# Patient Record
Sex: Male | Born: 1955 | Race: White | Hispanic: No | Marital: Married | Smoking: Never smoker
Health system: Southern US, Community
[De-identification: ages and names within clinical notes are randomized; demographics above are authoritative.]

## PROBLEM LIST (undated history)

## (undated) DIAGNOSIS — E785 Hyperlipidemia, unspecified: Secondary | ICD-10-CM

## (undated) DIAGNOSIS — R9389 Abnormal findings on diagnostic imaging of other specified body structures: Secondary | ICD-10-CM

## (undated) DIAGNOSIS — R42 Dizziness and giddiness: Secondary | ICD-10-CM

## (undated) DIAGNOSIS — G43909 Migraine, unspecified, not intractable, without status migrainosus: Secondary | ICD-10-CM

## (undated) DIAGNOSIS — Z9889 Other specified postprocedural states: Secondary | ICD-10-CM

## (undated) DIAGNOSIS — M1990 Unspecified osteoarthritis, unspecified site: Secondary | ICD-10-CM

## (undated) DIAGNOSIS — G35 Multiple sclerosis: Secondary | ICD-10-CM

## (undated) DIAGNOSIS — M4120 Other idiopathic scoliosis, site unspecified: Secondary | ICD-10-CM

## (undated) DIAGNOSIS — F419 Anxiety disorder, unspecified: Secondary | ICD-10-CM

## (undated) DIAGNOSIS — H269 Unspecified cataract: Secondary | ICD-10-CM

## (undated) DIAGNOSIS — R112 Nausea with vomiting, unspecified: Secondary | ICD-10-CM

## (undated) DIAGNOSIS — T7840XA Allergy, unspecified, initial encounter: Secondary | ICD-10-CM

## (undated) DIAGNOSIS — Z87442 Personal history of urinary calculi: Secondary | ICD-10-CM

## (undated) DIAGNOSIS — G709 Myoneural disorder, unspecified: Secondary | ICD-10-CM

## (undated) DIAGNOSIS — K219 Gastro-esophageal reflux disease without esophagitis: Secondary | ICD-10-CM

## (undated) HISTORY — PX: LUMBAR DISC SURGERY: SHX700

## (undated) HISTORY — DX: Hyperlipidemia, unspecified: E78.5

## (undated) HISTORY — DX: Unspecified cataract: H26.9

## (undated) HISTORY — DX: Abnormal findings on diagnostic imaging of other specified body structures: R93.89

## (undated) HISTORY — DX: Myoneural disorder, unspecified: G70.9

## (undated) HISTORY — DX: Unspecified osteoarthritis, unspecified site: M19.90

## (undated) HISTORY — PX: TONSILLECTOMY: SUR1361

## (undated) HISTORY — DX: Allergy, unspecified, initial encounter: T78.40XA

## (undated) HISTORY — DX: Gastro-esophageal reflux disease without esophagitis: K21.9

---

## 2001-11-16 HISTORY — PX: URETEROLITHOTOMY: SHX71

## 2004-10-28 ENCOUNTER — Ambulatory Visit: Payer: Self-pay | Admitting: Internal Medicine

## 2004-11-16 HISTORY — PX: US ECHOCARDIOGRAPHY: HXRAD669

## 2004-11-22 ENCOUNTER — Ambulatory Visit: Payer: Self-pay | Admitting: Internal Medicine

## 2004-11-30 ENCOUNTER — Ambulatory Visit: Payer: Self-pay | Admitting: Internal Medicine

## 2005-04-18 DIAGNOSIS — R9389 Abnormal findings on diagnostic imaging of other specified body structures: Secondary | ICD-10-CM

## 2005-04-18 HISTORY — DX: Abnormal findings on diagnostic imaging of other specified body structures: R93.89

## 2005-10-11 ENCOUNTER — Encounter: Payer: Self-pay | Admitting: Internal Medicine

## 2005-10-11 ENCOUNTER — Ambulatory Visit: Payer: Self-pay | Admitting: Ophthalmology

## 2005-10-16 DIAGNOSIS — G35 Multiple sclerosis: Secondary | ICD-10-CM | POA: Insufficient documentation

## 2005-10-28 ENCOUNTER — Ambulatory Visit: Payer: Self-pay | Admitting: Internal Medicine

## 2005-11-21 ENCOUNTER — Ambulatory Visit: Payer: Self-pay

## 2005-11-21 ENCOUNTER — Encounter: Payer: Self-pay | Admitting: Cardiology

## 2006-04-28 ENCOUNTER — Ambulatory Visit: Payer: Self-pay | Admitting: Internal Medicine

## 2006-07-19 DIAGNOSIS — N200 Calculus of kidney: Secondary | ICD-10-CM | POA: Insufficient documentation

## 2006-07-19 DIAGNOSIS — J301 Allergic rhinitis due to pollen: Secondary | ICD-10-CM | POA: Insufficient documentation

## 2006-09-20 ENCOUNTER — Encounter (INDEPENDENT_AMBULATORY_CARE_PROVIDER_SITE_OTHER): Payer: Self-pay | Admitting: *Deleted

## 2006-09-21 ENCOUNTER — Ambulatory Visit: Payer: Self-pay | Admitting: Internal Medicine

## 2006-09-21 LAB — CONVERTED CEMR LAB
Direct LDL: 152.2 mg/dL
PSA: 0.38 ng/mL (ref 0.10–4.00)

## 2006-11-09 ENCOUNTER — Ambulatory Visit: Payer: Self-pay | Admitting: Internal Medicine

## 2007-06-04 ENCOUNTER — Ambulatory Visit: Payer: Self-pay | Admitting: Internal Medicine

## 2007-06-04 DIAGNOSIS — R6882 Decreased libido: Secondary | ICD-10-CM | POA: Insufficient documentation

## 2007-06-13 ENCOUNTER — Ambulatory Visit: Payer: Self-pay | Admitting: Internal Medicine

## 2007-06-27 ENCOUNTER — Ambulatory Visit: Payer: Self-pay | Admitting: Internal Medicine

## 2007-06-27 ENCOUNTER — Encounter: Payer: Self-pay | Admitting: Internal Medicine

## 2007-06-27 HISTORY — PX: COLONOSCOPY: SHX174

## 2007-06-27 LAB — HM COLONOSCOPY

## 2007-08-14 ENCOUNTER — Telehealth (INDEPENDENT_AMBULATORY_CARE_PROVIDER_SITE_OTHER): Payer: Self-pay | Admitting: *Deleted

## 2007-08-20 ENCOUNTER — Telehealth: Payer: Self-pay | Admitting: Internal Medicine

## 2007-10-03 ENCOUNTER — Ambulatory Visit: Payer: Self-pay | Admitting: Internal Medicine

## 2007-10-03 DIAGNOSIS — E785 Hyperlipidemia, unspecified: Secondary | ICD-10-CM | POA: Insufficient documentation

## 2007-10-03 LAB — CONVERTED CEMR LAB: PSA: 0.33 ng/mL (ref 0.10–4.00)

## 2007-11-19 ENCOUNTER — Ambulatory Visit: Payer: Self-pay | Admitting: Internal Medicine

## 2007-11-19 DIAGNOSIS — J069 Acute upper respiratory infection, unspecified: Secondary | ICD-10-CM | POA: Insufficient documentation

## 2008-02-07 ENCOUNTER — Ambulatory Visit: Payer: Self-pay | Admitting: Internal Medicine

## 2008-06-24 ENCOUNTER — Ambulatory Visit: Payer: Self-pay | Admitting: Family Medicine

## 2008-06-24 DIAGNOSIS — IMO0002 Reserved for concepts with insufficient information to code with codable children: Secondary | ICD-10-CM | POA: Insufficient documentation

## 2008-07-31 ENCOUNTER — Telehealth: Payer: Self-pay | Admitting: Internal Medicine

## 2008-12-01 ENCOUNTER — Ambulatory Visit: Payer: Self-pay | Admitting: Internal Medicine

## 2008-12-01 DIAGNOSIS — IMO0002 Reserved for concepts with insufficient information to code with codable children: Secondary | ICD-10-CM | POA: Insufficient documentation

## 2009-04-03 ENCOUNTER — Telehealth: Payer: Self-pay | Admitting: Internal Medicine

## 2010-09-03 ENCOUNTER — Other Ambulatory Visit: Payer: Self-pay | Admitting: *Deleted

## 2010-09-03 MED ORDER — FLUTICASONE PROPIONATE 50 MCG/ACT NA SUSP: 2.0000 | Freq: Every day | NASAL | Status: DC

## 2010-09-03 NOTE — Telephone Encounter (Signed)
Patient advised as instructed via telephone.  Rx refilled electronically.

## 2010-09-03 NOTE — Telephone Encounter (Signed)
Patient has not been seen since 2010, please advise.

## 2010-09-03 NOTE — Telephone Encounter (Signed)
Okay to refill #1 x 2 Have him set up PE

## 2011-04-15 ENCOUNTER — Other Ambulatory Visit: Payer: Self-pay | Admitting: *Deleted

## 2011-04-15 MED ORDER — HYDROCORTISONE ACETATE 25 MG RE SUPP: 25.0000 mg | Freq: Three times a day (TID) | RECTAL | Status: DC | PRN

## 2011-04-15 NOTE — Telephone Encounter (Signed)
Spoke with patient and he states he has hemorrhoids, rx sent to pharmacy by e-script, and the box comes 24 to a box.

## 2011-04-15 NOTE — Telephone Encounter (Signed)
Received faxed refill request, medication was not on med list.  Please advise.

## 2011-04-15 NOTE — Telephone Encounter (Signed)
Please confirm that he needs these again (for rectal pain or bleeding) He has had a colonoscopy so we can refill #20 x 0 if he needs

## 2011-08-15 ENCOUNTER — Other Ambulatory Visit: Payer: Self-pay

## 2011-08-15 NOTE — Telephone Encounter (Signed)
Pt left v/m that [facility] would be requesting refill for flonase. Pt last seen 12/01/08. Pt did not leave contact #.

## 2011-08-16 MED ORDER — FLUTICASONE PROPIONATE 50 MCG/ACT NA SUSP: 2.0000 | Freq: Every day | NASAL | Status: DC

## 2011-08-16 NOTE — Telephone Encounter (Signed)
Patient notified as instructed by telephone. Flonase sent electronically #16 g x 11 to [facility]. Pt notified while on phone med sent and pt will call back to schedule appt.

## 2011-08-16 NOTE — Telephone Encounter (Signed)
.  left message to have patient return my call.  

## 2011-08-16 NOTE — Telephone Encounter (Signed)
Okay to fill for a year He needs to set up physical---within the next few months

## 2012-02-23 ENCOUNTER — Encounter: Payer: Self-pay | Admitting: Internal Medicine

## 2012-02-23 ENCOUNTER — Ambulatory Visit (INDEPENDENT_AMBULATORY_CARE_PROVIDER_SITE_OTHER): Payer: BC Managed Care – PPO | Admitting: Internal Medicine

## 2012-02-23 VITALS — BP 118/70 | HR 70 | Temp 98.4°F | Ht 70.0 in | Wt 173.0 lb

## 2012-02-23 DIAGNOSIS — Z0000 Encounter for general adult medical examination without abnormal findings: Secondary | ICD-10-CM

## 2012-02-23 DIAGNOSIS — K219 Gastro-esophageal reflux disease without esophagitis: Secondary | ICD-10-CM | POA: Insufficient documentation

## 2012-02-23 DIAGNOSIS — E785 Hyperlipidemia, unspecified: Secondary | ICD-10-CM

## 2012-02-23 DIAGNOSIS — Z23 Encounter for immunization: Secondary | ICD-10-CM

## 2012-02-23 NOTE — Assessment & Plan Note (Signed)
Mild symptoms Asked him to increase zantac to bid

## 2012-02-23 NOTE — Assessment & Plan Note (Signed)
Healthy Has really gotten in better shape Discussed PSA---he would like to forego that Colon due 2019

## 2012-02-23 NOTE — Progress Notes (Signed)
Subjective:    Patient ID: Cameron [name], male    DOB: July 11, 1955, 56 y.o.   MRN: [id number]  HPI Here for physical  Still with occ visual issues---sensitivity to fluorescent lights (like in dept stores---will get dizzy, sweaty and balance is off) Occurs mostly if he is wearing glasses--not contacts (monofocal) No other neurologic symptoms  Has gotten back into shape Lost the 15# he had gained Working out more Had crisis with mom's decline and death in Sep 23, 2010 and dad needed heart surgery---he started taking better care of himself  Current Outpatient Prescriptions on File Prior to Visit  Medication Sig Dispense Refill  . fluticasone (FLONASE) 50 MCG/ACT nasal spray Place 2 sprays into the nose daily.  16 g  11    Allergies  Allergen Reactions  . Citalopram Hydrobromide     REACTION: unspecified  . Penicillins     REACTION: questionable    Past Medical History  Diagnosis Date  . Allergy   . Hyperlipidemia   . Abnormal MRI 22-Sep-2005    nonspecific white matter abnormalities    Past Surgical History  Procedure Date  . Ureterolithotomy 11/2001  . US echocardiography 11/2004    normal, Carotids normal    Family History  Problem Relation Age of Onset  . Diabetes Father   . Heart disease Father   . CAD Neg Hx   . Hypertension Neg Hx   . Cancer Neg Hx     History   Social History  . Marital Status: Single    Spouse Name: N/A    Number of Children: N/A  . Years of Education: N/A   Occupational History  . 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 Smoking status: Never Smoker   . Smokeless tobacco: Never Used  . Alcohol Use: Yes     Comment: rare  . Drug Use: No  . Sexually Active: Not on file   Other Topics Concern  . Not on file   Social History Narrative   Long standing relationship with partner   Review of Systems  Constitutional: Negative for fatigue and unexpected weight change.       Wears seat  belt Running regularly and doing weight work  HENT: Positive for congestion and rhinorrhea. Negative for hearing loss, dental problem and tinnitus.        Regular with dentist flonase only in spring  Eyes: Negative for visual disturbance.       No diplopia or unilateral vision changes  Respiratory: Positive for cough. Negative for chest tightness and shortness of breath.        Does note some cough and phlegm after eating ?reflux issue  Cardiovascular: Negative for chest pain, palpitations and leg swelling.  Gastrointestinal: Negative for nausea, vomiting, abdominal pain, constipation and blood in stool.       No heartburn--- but does have some voice changes (may be from heavy singing schedule) No dysphagia  Genitourinary: Positive for urgency and difficulty urinating.       Doesn't empty fully in the morning No sexual problems  Musculoskeletal: Positive for back pain. Negative for joint swelling and arthralgias.       Occ mild low back pain after working out---ibuprofen helps   Skin: Negative for rash.       No suspicious areas  Neurological: Negative for dizziness, syncope, weakness, light-headedness, numbness and headaches.  Psychiatric/Behavioral: Negative for sleep disturbance and dysphoric mood. The patient is not nervous/anxious.  Does is own meditation       Objective:   Physical Exam  Constitutional: He is oriented to person, place, and time. He appears well-developed and well-nourished. No distress.  HENT:  Head: Normocephalic and atraumatic.  Right Ear: External ear normal.  Left Ear: External ear normal.  Mouth/Throat: Oropharynx is clear and moist. No oropharyngeal exudate.  Eyes: Conjunctivae normal and EOM are normal. Pupils are equal, round, and reactive to light.  Neck: Normal range of motion. Neck supple. No thyromegaly present.  Cardiovascular: Normal rate, regular rhythm, normal heart sounds and intact distal pulses.  Exam reveals no gallop.   No  murmur heard. Pulmonary/Chest: Effort normal and breath sounds normal. No respiratory distress. He has no wheezes. He has no rales.  Abdominal: Soft. There is no tenderness.  Musculoskeletal: Normal range of motion. He exhibits no edema and no tenderness.  Lymphadenopathy:    He has no cervical adenopathy.  Neurological: He is alert and oriented to person, place, and time.  Skin: No rash noted. No erythema.  Psychiatric: He has a normal mood and affect. His behavior is normal. Thought content normal.          Assessment & Plan:

## 2012-02-23 NOTE — Assessment & Plan Note (Signed)
Hopefully better now with exercise and weight loss Doesn't want meds for primary prevention at this point

## 2012-02-24 LAB — BASIC METABOLIC PANEL
CO2: 27 mEq/L (ref 19–32)
Calcium: 8.9 mg/dL (ref 8.4–10.5)
Creatinine, Ser: 1.3 mg/dL (ref 0.4–1.5)
GFR: 63.04 mL/min (ref >60.00)
Glucose, Bld: 83 mg/dL (ref 70–99)

## 2012-02-24 LAB — CBC WITH DIFFERENTIAL/PLATELET
Basophils Absolute: 0 10*3/uL (ref 0.0–0.1)
HCT: 45 % (ref 39.0–52.0)
Hemoglobin: 14.9 g/dL (ref 13.0–17.0)
Lymphs Abs: 2.8 10*3/uL (ref 0.7–4.0)
MCV: 94.2 fl (ref 78.0–100.0)
Monocytes Absolute: 0.4 10*3/uL (ref 0.1–1.0)
Monocytes Relative: 6.1 % (ref 3.0–12.0)
Neutro Abs: 3.6 10*3/uL (ref 1.4–7.7)
RDW: 12.2 % (ref 11.5–14.6)

## 2012-02-24 LAB — LDL CHOLESTEROL, DIRECT: Direct LDL: 175.4 mg/dL

## 2012-02-24 LAB — LIPID PANEL
Cholesterol: 246 mg/dL — ABNORMAL HIGH (ref 0–200)
Total CHOL/HDL Ratio: 5
Triglycerides: 122 mg/dL (ref 0.0–149.0)

## 2012-02-24 LAB — HEPATIC FUNCTION PANEL
ALT: 18 U/L (ref 0–53)
Total Bilirubin: 0.7 mg/dL (ref 0.3–1.2)
Total Protein: 7.3 g/dL (ref 6.0–8.3)

## 2012-02-28 ENCOUNTER — Encounter: Payer: Self-pay | Admitting: *Deleted

## 2012-07-09 ENCOUNTER — Telehealth: Payer: Self-pay | Admitting: *Deleted

## 2012-07-09 MED ORDER — HYDROCORTISONE ACETATE 25 MG RE SUPP: 25.0000 mg | Freq: Three times a day (TID) | RECTAL | Status: DC | PRN

## 2012-07-09 NOTE — Telephone Encounter (Signed)
rx sent to pharmacy by e-script  

## 2012-07-09 NOTE — Telephone Encounter (Signed)
Faxed request asking for hydrocortisone 25mg  suppository place 1 suppository rectally three times daily as needed,  Not on med list, last filled 04/15/11

## 2012-07-09 NOTE — Telephone Encounter (Signed)
Okay to fill #30 x 1

## 2012-09-19 ENCOUNTER — Encounter: Payer: Self-pay | Admitting: Internal Medicine

## 2012-09-19 ENCOUNTER — Ambulatory Visit (INDEPENDENT_AMBULATORY_CARE_PROVIDER_SITE_OTHER): Payer: BC Managed Care – PPO | Admitting: Internal Medicine

## 2012-09-19 VITALS — BP 100/70 | HR 71 | Temp 98.3°F | Wt 182.0 lb

## 2012-09-19 DIAGNOSIS — G35 Multiple sclerosis: Secondary | ICD-10-CM

## 2012-09-19 LAB — CBC WITH DIFFERENTIAL/PLATELET
Eosinophils Relative: 0.5 % (ref 0.0–5.0)
HCT: 46.2 % (ref 39.0–52.0)
Hemoglobin: 16 g/dL (ref 13.0–17.0)
Lymphocytes Relative: 37 % (ref 12.0–46.0)
Lymphs Abs: 2 10*3/uL (ref 0.7–4.0)
Monocytes Relative: 7.1 % (ref 3.0–12.0)
Neutro Abs: 2.9 10*3/uL (ref 1.4–7.7)
WBC: 5.3 10*3/uL (ref 4.5–10.5)

## 2012-09-19 LAB — HEPATIC FUNCTION PANEL
AST: 18 U/L (ref 0–37)
Albumin: 4.2 g/dL (ref 3.5–5.2)
Alkaline Phosphatase: 56 U/L (ref 39–117)
Bilirubin, Direct: 0 mg/dL (ref 0.0–0.3)
Total Protein: 7.3 g/dL (ref 6.0–8.3)

## 2012-09-19 LAB — VITAMIN B12: Vitamin B-12: 430 pg/mL (ref 211–911)

## 2012-09-19 LAB — BASIC METABOLIC PANEL
GFR: 75.15 mL/min (ref >60.00)
Potassium: 4.5 mEq/L (ref 3.5–5.1)
Sodium: 138 mEq/L (ref 135–145)

## 2012-09-19 LAB — SEDIMENTATION RATE: Sed Rate: 8 mm/hr (ref 0–22)

## 2012-09-19 LAB — TSH: TSH: 3.93 u[IU]/mL (ref 0.35–5.50)

## 2012-09-19 NOTE — Assessment & Plan Note (Signed)
He now has multiple neurologic symptoms separated in space Has had mild symptoms in past and known white matter abnormalities years ago Vision issue is unusual---has had regular opto checks without optic neuritis Runs with symptoms that sound like foot drop (but not found on exam) Sensory changes in ulnar area on left (?related to hitting arm during fall)  I feel the next step should be neuro eval We will set this up

## 2012-09-19 NOTE — Progress Notes (Signed)
Subjective:    Patient ID: Cameron [name], male    DOB: 03-12-1956, 57 y.o.   MRN: [id number]  HPI Has put on 9# since last visit Mostly because of a back injury (?sciatic nerve) Has noted that his left leg is not "tracking" right---he has stopped running The foot drags on treadmill or when he runs  Then his back seemed better so he went for a run and wound up falling Abraded hands, shoulder, etc. Took a month to heal up Now has prickly sensation in distal 3rd-5th left fingers Back of left leg feels abnormal in sensation also  He still has balance issues in bright light---lists to the left and breaks out with sweat Will have some confusion Only in [employer] or other places with bright fluorescent lighting  Has become cognitively overwhelmed a few times in the past few months ?perhaps 3-4 times Unable to focus or answer questions May last for about 15 minutes Concerned due to early dementia in dad (and GM also)  Current Outpatient Prescriptions on File Prior to Visit  Medication Sig Dispense Refill  . hydrocortisone (ANUSOL-HC) 25 MG suppository Place 1 suppository (25 mg total) rectally 3 (three) times daily as needed.  30 suppository  1  . ranitidine (ZANTAC) 150 MG tablet Take 150 mg by mouth 2 (two) times daily.        No current facility-administered medications on file prior to visit.    Allergies  Allergen Reactions  . Citalopram Hydrobromide     REACTION: unspecified  . Penicillins     REACTION: questionable    Past Medical History  Diagnosis Date  . Allergy   . Hyperlipidemia   . Abnormal MRI 2007    nonspecific white matter abnormalities  . GERD (gastroesophageal reflux disease)     Past Surgical History  Procedure Laterality Date  . Ureterolithotomy  11/2001  . US echocardiography  11/2004    normal, Carotids normal    Family History  Problem Relation Age of Onset  . Diabetes Father   . Heart disease Father   . Dementia Father   . CAD Neg Hx    . Hypertension Neg Hx   . Cancer Neg Hx   . Dementia Paternal Grandmother     History   Social History  . Marital Status: Single    Spouse Name: N/A    Number of Children: N/A  . Years of Education: N/A   Occupational History  . 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 Smoking status: Never Smoker   . Smokeless tobacco: Never Used  . Alcohol Use: Yes     Comment: rare  . Drug Use: No  . Sexually Active: Not on file   Other Topics Concern  . Not on file   Social History Narrative   Long standing relationship with partner   Review of Systems Sleeping okay Increased nocturia---drinking a lot of water lately Appetite is fine Not depressed or anhedonic---has had a great year Does note some anxiety now that he is on summer break--usually stays so busy    Objective:   Physical Exam  Constitutional: He is oriented to person, place, and time. He appears well-developed and well-nourished. No distress.  HENT:  Mouth/Throat: Oropharynx is clear and moist. No oropharyngeal exudate.  Eyes: Conjunctivae and EOM are normal. Pupils are equal, round, and reactive to light.  ?slight blurring of discs nasally  Neck: Normal range of motion. Neck supple. No thyromegaly  present.  Cardiovascular: Normal rate, regular rhythm and normal heart sounds.  Exam reveals no gallop.   No murmur heard. Pulmonary/Chest: Effort normal and breath sounds normal. No respiratory distress. He has no wheezes. He has no rales.  Musculoskeletal: He exhibits no edema and no tenderness.  Lymphadenopathy:    He has no cervical adenopathy.  Neurological: He is alert and oriented to person, place, and time. He has normal strength. He displays no tremor. No cranial nerve deficit or sensory deficit. He exhibits normal muscle tone. He displays a negative Romberg sign. Coordination and gait normal.  Reflexes symmetric  Psychiatric: He has a normal mood and affect. His  behavior is normal.          Assessment & Plan:

## 2012-09-21 ENCOUNTER — Other Ambulatory Visit: Payer: Self-pay | Admitting: *Deleted

## 2012-09-21 MED ORDER — FLUTICASONE PROPIONATE 50 MCG/ACT NA SUSP: 2.0000 | Freq: Every day | NASAL | Status: DC

## 2012-10-29 ENCOUNTER — Ambulatory Visit: Payer: Self-pay | Admitting: Neurology

## 2013-02-21 ENCOUNTER — Other Ambulatory Visit: Payer: Self-pay

## 2013-08-03 ENCOUNTER — Other Ambulatory Visit: Payer: Self-pay | Admitting: Internal Medicine

## 2013-08-05 NOTE — Telephone Encounter (Signed)
Ok to fill 

## 2013-08-05 NOTE — Telephone Encounter (Signed)
rx sent to pharmacy by e-script  

## 2013-08-05 NOTE — Telephone Encounter (Signed)
Okay to fill for a year Have him schedule PE for later this year

## 2013-09-05 ENCOUNTER — Ambulatory Visit (INDEPENDENT_AMBULATORY_CARE_PROVIDER_SITE_OTHER): Payer: BC Managed Care – PPO | Admitting: Internal Medicine

## 2013-09-05 ENCOUNTER — Encounter: Payer: Self-pay | Admitting: Internal Medicine

## 2013-09-05 VITALS — BP 100/60 | HR 68 | Temp 97.7°F | Wt 181.0 lb

## 2013-09-05 DIAGNOSIS — M5432 Sciatica, left side: Secondary | ICD-10-CM

## 2013-09-05 DIAGNOSIS — M5430 Sciatica, unspecified side: Secondary | ICD-10-CM

## 2013-09-05 NOTE — Progress Notes (Signed)
   Subjective:    Patient ID: Cameron [name], male    DOB: [birth year], 58 y.o.   MRN: [id number]  HPI Has periodic back problems Mostly on the left over his hip Considers it sciatica Some better now with intermittent icing [facility]--and that seemed to help also  Pain occasionally radiates to thigh and outside left knee No spine tenderness Usually will get comfortable in bed but variable--needs to pull his leg up to get out (and into car)  Has tried ibuprofen 600mg  in AM--may repeat at bedtime Seems to help some  Current Outpatient Prescriptions on File Prior to Visit  Medication Sig Dispense Refill  . fluticasone (FLONASE) 50 MCG/ACT nasal spray USE 2 SPRAYS IN EACH NOSTRIL EVERY DAY AS DIRECTED  16 g  11  . hydrocortisone (ANUSOL-HC) 25 MG suppository Place 1 suppository (25 mg total) rectally 3 (three) times daily as needed.  30 suppository  1  . ranitidine (ZANTAC) 150 MG tablet Take 150 mg by mouth 2 (two) times daily.        No current facility-administered medications on file prior to visit.    Allergies  Allergen Reactions  . Citalopram Hydrobromide     REACTION: unspecified  . Penicillins     REACTION: questionable    Past Medical History  Diagnosis Date  . Allergy   . Hyperlipidemia   . Abnormal MRI 2007    nonspecific white matter abnormalities  . GERD (gastroesophageal reflux disease)     Past Surgical History  Procedure Laterality Date  . Ureterolithotomy  11/2001  . US echocardiography  11/2004    normal, Carotids normal    Family History  Problem Relation Age of Onset  . Diabetes Father   . Heart disease Father   . Dementia Father   . CAD Neg Hx   . Hypertension Neg Hx   . Cancer Neg Hx   . Dementia Paternal Grandmother     History   Social History  . Marital Status: Married    Spouse Name: N/A    Number of Children: N/A  . Years of Education: N/A   Occupational History  . 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 Smoking status: Never Smoker   . Smokeless tobacco: Never Used  . Alcohol Use: Yes     Comment: rare  . Drug Use: No  . Sexual Activity: Not on file   Other Topics Concern  . Not on file   Social History Narrative   Long standing relationship with partner   Review of Systems Still has some foot dragging--- doesn't run outside due to this No leg weakness    Objective:   Physical Exam  Musculoskeletal:  No spine tenderness Normal ROM at left hip SLR negative  Neurological:  Gait is normal Strength normal in legs except mild weakness in dorsiflexion at left ankle          Assessment & Plan:

## 2013-09-05 NOTE — Assessment & Plan Note (Signed)
Better with conservative Rx Discussed generally good prognosis NSAIDs, etc

## 2013-09-05 NOTE — Progress Notes (Signed)
Pre visit review using our clinic review tool, if applicable. No additional management support is needed unless otherwise documented below in the visit note. 

## 2013-09-05 NOTE — Patient Instructions (Signed)
Please look into an ankle foot orthotic for your left foot.

## 2013-10-16 ENCOUNTER — Encounter: Payer: Self-pay | Admitting: Internal Medicine

## 2013-10-16 ENCOUNTER — Ambulatory Visit (INDEPENDENT_AMBULATORY_CARE_PROVIDER_SITE_OTHER): Payer: BC Managed Care – PPO | Admitting: Internal Medicine

## 2013-10-16 VITALS — BP 110/60 | HR 63 | Temp 97.9°F | Ht 70.0 in | Wt 182.0 lb

## 2013-10-16 DIAGNOSIS — K219 Gastro-esophageal reflux disease without esophagitis: Secondary | ICD-10-CM

## 2013-10-16 DIAGNOSIS — Z125 Encounter for screening for malignant neoplasm of prostate: Secondary | ICD-10-CM

## 2013-10-16 DIAGNOSIS — Z0000 Encounter for general adult medical examination without abnormal findings: Secondary | ICD-10-CM

## 2013-10-16 DIAGNOSIS — E785 Hyperlipidemia, unspecified: Secondary | ICD-10-CM

## 2013-10-16 LAB — LIPID PANEL
CHOL/HDL RATIO: 5
CHOLESTEROL: 270 mg/dL — AB (ref 0–200)
HDL: 50.5 mg/dL (ref >39.00)
LDL Cholesterol: 183 mg/dL — ABNORMAL HIGH (ref 0–99)
NonHDL: 219.5
Triglycerides: 185 mg/dL — ABNORMAL HIGH (ref 0.0–149.0)
VLDL: 37 mg/dL (ref 0.0–40.0)

## 2013-10-16 LAB — PSA: PSA: 0.43 ng/mL (ref 0.10–4.00)

## 2013-10-16 LAB — COMPREHENSIVE METABOLIC PANEL
ALT: 15 U/L (ref 0–53)
AST: 20 U/L (ref 0–37)
Albumin: 4.2 g/dL (ref 3.5–5.2)
Alkaline Phosphatase: 57 U/L (ref 39–117)
BUN: 14 mg/dL (ref 6–23)
CO2: 28 mEq/L (ref 19–32)
Calcium: 9.4 mg/dL (ref 8.4–10.5)
Chloride: 102 mEq/L (ref 96–112)
Creatinine, Ser: 1.1 mg/dL (ref 0.4–1.5)
GFR: 76.5 mL/min (ref >60.00)
Glucose, Bld: 75 mg/dL (ref 70–99)
Potassium: 4.9 mEq/L (ref 3.5–5.1)
Sodium: 136 mEq/L (ref 135–145)
Total Bilirubin: 0.9 mg/dL (ref 0.2–1.2)
Total Protein: 7.3 g/dL (ref 6.0–8.3)

## 2013-10-16 LAB — CBC WITH DIFFERENTIAL/PLATELET
BASOS ABS: 0 10*3/uL (ref 0.0–0.1)
Basophils Relative: 0.8 % (ref 0.0–3.0)
EOS ABS: 0 10*3/uL (ref 0.0–0.7)
Eosinophils Relative: 0.8 % (ref 0.0–5.0)
HEMATOCRIT: 43.3 % (ref 39.0–52.0)
Hemoglobin: 14.9 g/dL (ref 13.0–17.0)
LYMPHS ABS: 2 10*3/uL (ref 0.7–4.0)
Lymphocytes Relative: 44.2 % (ref 12.0–46.0)
MCHC: 34.3 g/dL (ref 30.0–36.0)
MCV: 92.4 fl (ref 78.0–100.0)
MONO ABS: 0.4 10*3/uL (ref 0.1–1.0)
Monocytes Relative: 8.6 % (ref 3.0–12.0)
Neutro Abs: 2.1 10*3/uL (ref 1.4–7.7)
Neutrophils Relative %: 45.6 % (ref 43.0–77.0)
PLATELETS: 241 10*3/uL (ref 150.0–400.0)
RBC: 4.69 Mil/uL (ref 4.22–5.81)
RDW: 12.9 % (ref 11.5–15.5)
WBC: 4.6 10*3/uL (ref 4.0–10.5)

## 2013-10-16 LAB — TSH: TSH: 5.45 u[IU]/mL — AB (ref 0.35–4.50)

## 2013-10-16 LAB — T4, FREE: Free T4: 0.67 ng/dL (ref 0.60–1.60)

## 2013-10-16 NOTE — Progress Notes (Signed)
Subjective:    Patient ID: Cameron [name], male    DOB: 10-Sep-1955, 58 y.o.   MRN: [id number]  HPI Here for physical  Is better from the sciatica [place] [facility] [place] [facility]---heavy walking or jogging on treadmill (5 miles) Gait still off some--- left foot will still drag some when fatigued Still has some numbness in left fingers also No other neuro problems of note. Feels the light sensitivity is better using old glasses  No other concerns Still not "in control of my diet"  Using the zantac bid regularly This has controlled his heartburn When not on it--- it affected his voice  Current Outpatient Prescriptions on File Prior to Visit  Medication Sig Dispense Refill  . ranitidine (ZANTAC) 150 MG tablet Take 150 mg by mouth 2 (two) times daily.        No current facility-administered medications on file prior to visit.    Allergies  Allergen Reactions  . Citalopram Hydrobromide     REACTION: unspecified  . Penicillins     REACTION: questionable    Past Medical History  Diagnosis Date  . Allergy   . Hyperlipidemia   . Abnormal MRI 2007    nonspecific white matter abnormalities  . GERD (gastroesophageal reflux disease)     Past Surgical History  Procedure Laterality Date  . Ureterolithotomy  11/2001  . US echocardiography  11/2004    normal, Carotids normal    Family History  Problem Relation Age of Onset  . Diabetes Father   . Heart disease Father   . Dementia Father   . CAD Neg Hx   . Hypertension Neg Hx   . Cancer Neg Hx   . Dementia Paternal Grandmother     History   Social History  . Marital Status: Married    Spouse Name: N/A    Number of Children: N/A  . Years of Education: N/A   Occupational History  . 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 Smoking status: Never Smoker   . Smokeless tobacco: Never Used  . Alcohol Use: Yes     Comment: rare  . Drug Use: No  . Sexual Activity: Not on file   Other  Topics Concern  . Not on file   Social History Narrative   Long standing relationship with partner   Review of Systems  Constitutional: Negative for fatigue and unexpected weight change.       Wears seat belt  HENT: Negative for dental problem, hearing loss and tinnitus.        Regular with dentist  Eyes: Positive for photophobia. Negative for visual disturbance.       Still sensitive to dappled light or fluorescents (gets him confused)--?ocular migraine  Respiratory: Negative for cough, chest tightness and shortness of breath.   Cardiovascular: Positive for palpitations. Negative for chest pain and leg swelling.       Occ palps if too much caffeine  Gastrointestinal: Negative for nausea, vomiting, abdominal pain, constipation and blood in stool.  Endocrine: Negative for cold intolerance and heat intolerance.  Genitourinary: Positive for urgency and difficulty urinating.       Some trouble with incomplete emptying No sexual problems  Musculoskeletal: Positive for back pain. Negative for arthralgias and joint swelling.  Skin: Negative for rash.       No suspicious lesions  Allergic/Immunologic: Positive for environmental allergies. Negative for immunocompromised state.  Neurological: Positive for numbness and headaches. Negative for  dizziness, syncope, weakness and light-headedness.  Psychiatric/Behavioral: Negative for sleep disturbance and dysphoric mood. The patient is not nervous/anxious.        Objective:   Physical Exam  Constitutional: He is oriented to person, place, and time. He appears well-developed and well-nourished. No distress.  HENT:  Head: Normocephalic and atraumatic.  Right Ear: External ear normal.  Left Ear: External ear normal.  Mouth/Throat: Oropharynx is clear and moist. No oropharyngeal exudate.  Eyes: Conjunctivae and EOM are normal. Pupils are equal, round, and reactive to light.  Neck: Normal range of motion. Neck supple. No thyromegaly present.    Cardiovascular: Normal rate, regular rhythm, normal heart sounds and intact distal pulses.  Exam reveals no gallop.   No murmur heard. Pulmonary/Chest: Effort normal and breath sounds normal. No respiratory distress. He has no wheezes. He has no rales.  Abdominal: Soft. There is no tenderness.  Musculoskeletal: He exhibits no edema and no tenderness.  Lymphadenopathy:    He has no cervical adenopathy.  Neurological: He is alert and oriented to person, place, and time.  Skin: No rash noted. No erythema.  Psychiatric: He has a normal mood and affect. His behavior is normal.          Assessment & Plan:

## 2013-10-16 NOTE — Assessment & Plan Note (Signed)
Fine with the ranitidine

## 2013-10-16 NOTE — Assessment & Plan Note (Signed)
Discussed primary prevention--- no statins for him

## 2013-10-16 NOTE — Assessment & Plan Note (Signed)
Healthy Will check PSA after discussion

## 2013-10-16 NOTE — Progress Notes (Signed)
Pre visit review using our clinic review tool, if applicable. No additional management support is needed unless otherwise documented below in the visit note. 

## 2013-10-16 NOTE — Patient Instructions (Signed)
DASH Eating Plan  DASH stands for "Dietary Approaches to Stop Hypertension." The DASH eating plan is a healthy eating plan that has been shown to reduce high blood pressure (hypertension). Additional health benefits may include reducing the risk of type 2 diabetes mellitus, heart disease, and stroke. The DASH eating plan may also help with weight loss.  WHAT DO I NEED TO KNOW ABOUT THE DASH EATING PLAN?  For the DASH eating plan, you will follow these general guidelines:  · Choose foods with a percent daily value for sodium of less than 5% (as listed on the food label).  · Use salt-free seasonings or herbs instead of table salt or sea salt.  · Check with your health care provider or pharmacist before using salt substitutes.  · Eat lower-sodium products, often labeled as "lower sodium" or "no salt added."  · Eat fresh foods.  · Eat more vegetables, fruits, and low-fat dairy products.  · Choose whole grains. Look for the word "whole" as the first word in the ingredient list.  · Choose fish and skinless chicken or turkey more often than red meat. Limit fish, poultry, and meat to 6 oz (170 g) each day.  · Limit sweets, desserts, sugars, and sugary drinks.  · Choose heart-healthy fats.  · Limit cheese to 1 oz (28 g) per day.  · Eat more home-cooked food and less restaurant, buffet, and fast food.  · Limit fried foods.  · Cook foods using methods other than frying.  · Limit canned vegetables. If you do use them, rinse them well to decrease the sodium.  · When eating at a restaurant, ask that your food be prepared with less salt, or no salt if possible.  WHAT FOODS CAN I EAT?  Seek help from a dietitian for individual calorie needs.  Grains  Whole grain or whole wheat bread. Brown rice. Whole grain or whole wheat pasta. Quinoa, bulgur, and whole grain cereals. Low-sodium cereals. Corn or whole wheat flour tortillas. Whole grain cornbread. Whole grain crackers. Low-sodium crackers.  Vegetables  Fresh or frozen vegetables  (raw, steamed, roasted, or grilled). Low-sodium or reduced-sodium tomato and vegetable juices. Low-sodium or reduced-sodium tomato sauce and paste. Low-sodium or reduced-sodium canned vegetables.   Fruits  All fresh, canned (in natural juice), or frozen fruits.  Meat and Other Protein Products  Ground beef (85% or leaner), grass-fed beef, or beef trimmed of fat. Skinless chicken or turkey. Ground chicken or turkey. Pork trimmed of fat. All fish and seafood. Eggs. Dried beans, peas, or lentils. Unsalted nuts and seeds. Unsalted canned beans.  Dairy  Low-fat dairy products, such as skim or 1% milk, 2% or reduced-fat cheeses, low-fat ricotta or cottage cheese, or plain low-fat yogurt. Low-sodium or reduced-sodium cheeses.  Fats and Oils  Tub margarines without trans fats. Light or reduced-fat mayonnaise and salad dressings (reduced sodium). Avocado. Safflower, olive, or canola oils. Natural peanut or almond butter.  Other  Unsalted popcorn and pretzels.  The items listed above may not be a complete list of recommended foods or beverages. Contact your dietitian for more options.  WHAT FOODS ARE NOT RECOMMENDED?  Grains  White bread. White pasta. White rice. Refined cornbread. Bagels and croissants. Crackers that contain trans fat.  Vegetables  Creamed or fried vegetables. Vegetables in a cheese sauce. Regular canned vegetables. Regular canned tomato sauce and paste. Regular tomato and vegetable juices.  Fruits  Dried fruits. Canned fruit in light or heavy syrup. Fruit juice.  Meat and Other Protein   Products  Fatty cuts of meat. Ribs, chicken wings, bacon, sausage, bologna, salami, chitterlings, fatback, hot dogs, bratwurst, and packaged luncheon meats. Salted nuts and seeds. Canned beans with salt.  Dairy  Whole or 2% milk, cream, half-and-half, and cream cheese. Whole-fat or sweetened yogurt. Full-fat cheeses or blue cheese. Nondairy creamers and whipped toppings. Processed cheese, cheese spreads, or cheese  curds.  Condiments  Onion and garlic salt, seasoned salt, table salt, and sea salt. Canned and packaged gravies. Worcestershire sauce. Tartar sauce. Barbecue sauce. Teriyaki sauce. Soy sauce, including reduced sodium. Steak sauce. Fish sauce. Oyster sauce. Cocktail sauce. Horseradish. Ketchup and mustard. Meat flavorings and tenderizers. Bouillon cubes. Hot sauce. Tabasco sauce. Marinades. Taco seasonings. Relishes.  Fats and Oils  Butter, stick margarine, lard, shortening, ghee, and bacon fat. Coconut, palm kernel, or palm oils. Regular salad dressings.  Other  Pickles and olives. Salted popcorn and pretzels.  The items listed above may not be a complete list of foods and beverages to avoid. Contact your dietitian for more information.  WHERE CAN I FIND MORE INFORMATION?  National Heart, Lung, and Blood Institute: www.nhlbi.nih.gov/health/health-topics/topics/dash/  Document Released: 03/24/2011 Document Revised: 04/09/2013 Document Reviewed: 02/06/2013  ExitCare® Patient Information ©2015 [employer]. This information is not intended to replace advice given to you by your health care provider. Make sure you discuss any questions you have with your health care provider.

## 2014-01-24 ENCOUNTER — Encounter: Payer: Self-pay | Admitting: Internal Medicine

## 2014-05-29 ENCOUNTER — Encounter: Payer: Self-pay | Admitting: Internal Medicine

## 2014-05-29 ENCOUNTER — Ambulatory Visit (INDEPENDENT_AMBULATORY_CARE_PROVIDER_SITE_OTHER): Payer: BLUE CROSS/BLUE SHIELD | Admitting: Internal Medicine

## 2014-05-29 VITALS — BP 110/62 | HR 71 | Temp 97.7°F | Wt 179.0 lb

## 2014-05-29 DIAGNOSIS — M545 Low back pain, unspecified: Secondary | ICD-10-CM

## 2014-05-29 NOTE — Assessment & Plan Note (Signed)
No sciatica No weakness or worrisome neuro findings Hips are fine Probably muscular---doesn't sound like spinal stenosis either due to not exertional  Okay to continue stretching and ibuprofen Dicussed core strengthening

## 2014-05-29 NOTE — Progress Notes (Signed)
Pre visit review using our clinic review tool, if applicable. No additional management support is needed unless otherwise documented below in the visit note. 

## 2014-05-29 NOTE — Progress Notes (Signed)
   Subjective:    Patient ID: Cameron [name], male    DOB: 09-18-1955, 59 y.o.   MRN: [id number]  HPI Having trouble again with his back Bothering him a lot for about 2 weeks Tightness in lower back on both sides---then around hips and into groin Feels "deep" pain in right hip Right side is more tight than left Not working out lately--he thinks this is part of the problem  Stretching helps some--found some exercises on line Using ibuprofen with some relief--- 800mg  in AM only  No radiation into legs (just the tops of anterior thighs) No leg weakness Feels his gait has changed some  Current Outpatient Prescriptions on File Prior to Visit  Medication Sig Dispense Refill  . ranitidine (ZANTAC) 150 MG tablet Take 150 mg by mouth 2 (two) times daily.      No current facility-administered medications on file prior to visit.    Allergies  Allergen Reactions  . Citalopram Hydrobromide     REACTION: unspecified  . Penicillins     REACTION: questionable    Past Medical History  Diagnosis Date  . Allergy   . Hyperlipidemia   . Abnormal MRI 2007    nonspecific white matter abnormalities  . GERD (gastroesophageal reflux disease)     Past Surgical History  Procedure Laterality Date  . Ureterolithotomy  11/2001  . US echocardiography  11/2004    normal, Carotids normal    Family History  Problem Relation Age of Onset  . Diabetes Father   . Heart disease Father   . Dementia Father   . CAD Neg Hx   . Hypertension Neg Hx   . Cancer Neg Hx   . Dementia Paternal Grandmother     History   Social History  . Marital Status: Married    Spouse Name: N/A  . Number of Children: N/A  . Years of Education: N/A   Occupational History  . 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 Smoking status: Never Smoker   . Smokeless tobacco: Never Used  . Alcohol Use: Yes     Comment: rare  . Drug Use: No  . Sexual Activity: Not on file    Other Topics Concern  . Not on file   Social History Narrative   Long standing relationship with partner   Review of Systems  No changes in bowels No loss of bladder control     Objective:   Physical Exam  Constitutional: He appears well-developed and well-nourished. No distress.  Musculoskeletal:  No spine tenderness Some slight tenderness posterior right hip SLR negative Normal ROM in both hips  Neurological:  Normal gait No focal weakness          Assessment & Plan:

## 2014-10-21 ENCOUNTER — Encounter: Payer: Self-pay | Admitting: Internal Medicine

## 2014-10-21 ENCOUNTER — Ambulatory Visit (INDEPENDENT_AMBULATORY_CARE_PROVIDER_SITE_OTHER): Payer: BC Managed Care – PPO | Admitting: Internal Medicine

## 2014-10-21 VITALS — BP 110/70 | HR 72 | Temp 97.6°F | Ht 70.0 in | Wt 185.0 lb

## 2014-10-21 DIAGNOSIS — M545 Low back pain, unspecified: Secondary | ICD-10-CM

## 2014-10-21 DIAGNOSIS — Z0000 Encounter for general adult medical examination without abnormal findings: Secondary | ICD-10-CM

## 2014-10-21 DIAGNOSIS — E785 Hyperlipidemia, unspecified: Secondary | ICD-10-CM | POA: Diagnosis not present

## 2014-10-21 LAB — CBC WITH DIFFERENTIAL/PLATELET
BASOS PCT: 0.6 % (ref 0.0–3.0)
Basophils Absolute: 0 10*3/uL (ref 0.0–0.1)
EOS PCT: 1.1 % (ref 0.0–5.0)
Eosinophils Absolute: 0.1 10*3/uL (ref 0.0–0.7)
HCT: 46.3 % (ref 39.0–52.0)
HEMOGLOBIN: 15.8 g/dL (ref 13.0–17.0)
LYMPHS ABS: 2 10*3/uL (ref 0.7–4.0)
Lymphocytes Relative: 37.2 % (ref 12.0–46.0)
MCHC: 34.2 g/dL (ref 30.0–36.0)
MCV: 91.8 fl (ref 78.0–100.0)
MONO ABS: 0.5 10*3/uL (ref 0.1–1.0)
MONOS PCT: 9.8 % (ref 3.0–12.0)
NEUTROS ABS: 2.8 10*3/uL (ref 1.4–7.7)
Neutrophils Relative %: 51.3 % (ref 43.0–77.0)
Platelets: 241 10*3/uL (ref 150.0–400.0)
RBC: 5.04 Mil/uL (ref 4.22–5.81)
RDW: 12.9 % (ref 11.5–15.5)
WBC: 5.4 10*3/uL (ref 4.0–10.5)

## 2014-10-21 LAB — COMPREHENSIVE METABOLIC PANEL
ALBUMIN: 4.1 g/dL (ref 3.5–5.2)
ALT: 23 U/L (ref 0–53)
AST: 24 U/L (ref 0–37)
Alkaline Phosphatase: 66 U/L (ref 39–117)
BUN: 11 mg/dL (ref 6–23)
CALCIUM: 9.4 mg/dL (ref 8.4–10.5)
CHLORIDE: 102 meq/L (ref 96–112)
CO2: 29 mEq/L (ref 19–32)
CREATININE: 1.08 mg/dL (ref 0.40–1.50)
GFR: 74.6 mL/min (ref >60.00)
GLUCOSE: 84 mg/dL (ref 70–99)
Potassium: 4.6 mEq/L (ref 3.5–5.1)
Sodium: 136 mEq/L (ref 135–145)
Total Bilirubin: 0.5 mg/dL (ref 0.2–1.2)
Total Protein: 7.8 g/dL (ref 6.0–8.3)

## 2014-10-21 LAB — LIPID PANEL
CHOL/HDL RATIO: 6
Cholesterol: 227 mg/dL — ABNORMAL HIGH (ref 0–200)
HDL: 37.2 mg/dL — ABNORMAL LOW (ref >39.00)
NonHDL: 189.8
Triglycerides: 265 mg/dL — ABNORMAL HIGH (ref 0.0–149.0)
VLDL: 53 mg/dL — ABNORMAL HIGH (ref 0.0–40.0)

## 2014-10-21 LAB — LDL CHOLESTEROL, DIRECT: LDL DIRECT: 136 mg/dL

## 2014-10-21 LAB — T4, FREE: Free T4: 0.6 ng/dL (ref 0.60–1.60)

## 2014-10-21 NOTE — Assessment & Plan Note (Signed)
Generally healthy Colon due 2019 Will defer PSA to next year Discussed fitness

## 2014-10-21 NOTE — Assessment & Plan Note (Signed)
Has worsened Doesn't seem to be his hip ??early spinal stenosis, ?muscular He will continue ibuprofen, start massage, core, etc Ortho if worsens

## 2014-10-21 NOTE — Progress Notes (Signed)
Subjective:    Patient ID: Cameron [name], male    DOB: October 01, 1955, 59 y.o.   MRN: [id number]  HPI Here for physical  Taking the summer off Family issues Friend with stage 4 cancer (uterine)--- they have been seeing her for support  Now having fairly chronic back pain Had to take on extra load last semester and put on weight Now back to exercise and core strenghtening Taking lots of ibuprofen (600mg  after exercise in AM and occasionally another dose) and heat/patches More pain if on his feet more Lower back into hip--mostly on right. Occasional left hip pain also Stiff in AM but then exercises and loosens up (incline treadmill or crosstrainer) Ices after work outs Considering massage therapy  Still notes slight left toe dragging (foot drop) Left 4th and 5th fingers still tingle  Current Outpatient Prescriptions on File Prior to Visit  Medication Sig Dispense Refill  . ranitidine (ZANTAC) 150 MG tablet Take 150 mg by mouth 2 (two) times daily.      No current facility-administered medications on file prior to visit.    Allergies  Allergen Reactions  . Citalopram Hydrobromide     REACTION: unspecified  . Penicillins     REACTION: questionable    Past Medical History  Diagnosis Date  . Allergy   . Hyperlipidemia   . Abnormal MRI 2007    nonspecific white matter abnormalities  . GERD (gastroesophageal reflux disease)     Past Surgical History  Procedure Laterality Date  . Ureterolithotomy  11/2001  . US echocardiography  11/2004    normal, Carotids normal    Family History  Problem Relation Age of Onset  . Diabetes Father   . Heart disease Father   . Dementia Father   . CAD Neg Hx   . Hypertension Neg Hx   . Cancer Neg Hx   . Dementia Paternal Grandmother     History   Social History  . Marital Status: Married    Spouse Name: N/A  . Number of Children: N/A  . Years of Education: N/A   Occupational History  . 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 Smoking status: Never Smoker   . Smokeless tobacco: Never Used  . Alcohol Use: Yes     Comment: rare  . Drug Use: No  . Sexual Activity: Not on file   Other Topics Concern  . Not on file   Social History Narrative   Long standing relationship with partner--finally able to marry   Review of Systems  Constitutional: Negative for fatigue and unexpected weight change.       Wears seat belt  HENT: Negative for dental problem, hearing loss and tinnitus.        Keeps up with dentist  Eyes: Negative for visual disturbance.       No diplopia or unilateral vision loss  Respiratory: Negative for cough, chest tightness and shortness of breath.   Cardiovascular: Negative for chest pain, palpitations and leg swelling.  Gastrointestinal: Negative for constipation and blood in stool.       Some stomach upset in past couple of days-- loose stools, etc. Seems to be improving  Endocrine: Negative for polydipsia and polyuria.  Genitourinary: Positive for difficulty urinating.       Stream is not as strong--slight dribbling No sexual problems  Musculoskeletal: Positive for back pain and arthralgias. Negative for joint swelling.  Skin: Negative for rash.  No suspicious lesions  Allergic/Immunologic: Positive for environmental allergies. Negative for immunocompromised state.       Uses flonase in season  Neurological: Positive for numbness. Negative for dizziness, syncope, weakness, light-headedness and headaches.  Hematological: Negative for adenopathy. Does not bruise/bleed easily.  Psychiatric/Behavioral: Negative for sleep disturbance and dysphoric mood. The patient is not nervous/anxious.        Interrupted sleep mostly due to partner       Objective:   Physical Exam  Constitutional: He is oriented to person, place, and time. He appears well-nourished. No distress.  HENT:  Head: Normocephalic and atraumatic.  Right Ear: External ear  normal.  Left Ear: External ear normal.  Mouth/Throat: Oropharynx is clear and moist. No oropharyngeal exudate.  Eyes: Conjunctivae and EOM are normal. Pupils are equal, round, and reactive to light.  Neck: Normal range of motion. Neck supple. No thyromegaly present.  Cardiovascular: Normal rate, regular rhythm, normal heart sounds and intact distal pulses.  Exam reveals no gallop.   No murmur heard. Pulmonary/Chest: Effort normal and breath sounds normal. No respiratory distress. He has no wheezes. He has no rales.  Abdominal: Soft. There is no tenderness.  Musculoskeletal: He exhibits no edema or tenderness.  No spine tenderness SLR negative bilaterally Fairly normal ROM in hips No hip bursa tenderness  Lymphadenopathy:    He has no cervical adenopathy.  Neurological: He is alert and oriented to person, place, and time.  Skin: No rash noted. No erythema.  Psychiatric: He has a normal mood and affect. His behavior is normal.          Assessment & Plan:

## 2014-10-21 NOTE — Assessment & Plan Note (Signed)
Fairly high but he prefers no primary prevention with statin

## 2014-10-21 NOTE — Progress Notes (Signed)
Pre visit review using our clinic review tool, if applicable. No additional management support is needed unless otherwise documented below in the visit note. 

## 2014-12-24 ENCOUNTER — Other Ambulatory Visit: Payer: Self-pay | Admitting: Internal Medicine

## 2015-01-06 ENCOUNTER — Other Ambulatory Visit: Payer: Self-pay | Admitting: Internal Medicine

## 2015-02-12 ENCOUNTER — Telehealth: Payer: Self-pay | Admitting: Internal Medicine

## 2015-02-12 NOTE — Telephone Encounter (Signed)
Patient Name: Cameron [name] DOB: [birth year] Initial Comment Caller states he may be passing blood in stool; He just came back from [place]; He thought it was happening before he left; Nurse Assessment Nurse: Vallery Sa, RN, Tye [place] Date/Time (Eastern Time): 02/12/2015 8:40:41 AM Confirm and document reason for call. If symptomatic, describe symptoms. ---Caller states he developed blood in his stool about 1.5 weeks ago that is worse today. No fever. No injury in the past 3 days. Has the patient traveled out of the country within the last 30 days? ---Yes Where have you traveled? ([place] for Ebola and Ebola guideline, [place], [place] for [employer]) ---[place] Does the patient have any new or worsening symptoms? ---Yes Will a triage be completed? ---Yes Related visit to physician within the last 2 weeks? ---No Does the PT have any chronic conditions? (i.e. diabetes, asthma, etc.) ---Yes List chronic conditions. ---Acid Reflux, Hemorrhoids Guidelines Guideline Title Affirmed Question Affirmed Notes Rectal Bleeding MODERATE rectal bleeding (small blood clots, passing blood without stool, or toilet water turns red) Final Disposition User See Physician within [place], RN, Tye [place] Comments Scheduled for 7:15am appointment with Dr. Silvio Pate on 02/13/15 Referrals REFERRED TO PCP OFFICE Disagree/Comply: Leta Baptist

## 2015-02-12 NOTE — Telephone Encounter (Signed)
Has known hemorrhoids Will evaluate in the morning

## 2015-02-12 NOTE — Telephone Encounter (Signed)
appt scheduled with Dr. Silvio Pate tomorrow 02/13/15 at 7:15

## 2015-02-13 ENCOUNTER — Ambulatory Visit (INDEPENDENT_AMBULATORY_CARE_PROVIDER_SITE_OTHER): Payer: BC Managed Care – PPO | Admitting: Internal Medicine

## 2015-02-13 ENCOUNTER — Encounter: Payer: Self-pay | Admitting: Internal Medicine

## 2015-02-13 VITALS — BP 106/68 | HR 65 | Temp 98.3°F | Wt 183.5 lb

## 2015-02-13 DIAGNOSIS — K625 Hemorrhage of anus and rectum: Secondary | ICD-10-CM

## 2015-02-13 NOTE — Progress Notes (Signed)
Pre visit review using our clinic review tool, if applicable. No additional management support is needed unless otherwise documented below in the visit note. 

## 2015-02-13 NOTE — Progress Notes (Signed)
   Subjective:    Patient ID: Cameron [name], male    DOB: 18-Dec-1955, 59 y.o.   MRN: [id number]  HPI Here due to rectal bleeding  About 2 weeks about, may have had some spotting on toilet paper--but not clear cut Nothing in the bowl Recently returned from [place] 2 days ago--generally some constipation when travels Noticed bright red blood on toilet paper  Stool looks normal Used preparation wipes ---witch hazel--then it stopped  Current Outpatient Prescriptions on File Prior to Visit  Medication Sig Dispense Refill  . ranitidine (ZANTAC) 150 MG tablet Take 150 mg by mouth daily as needed.     . ANUCORT-HC 25 MG suppository UNWRAP AND INSERT 1 SUPPOSITORY INTO THERECTUM 3 TIMES DAILY AS NEEDED (Patient not taking: Reported on 02/13/2015) 30 suppository 1   No current facility-administered medications on file prior to visit.    Allergies  Allergen Reactions  . Citalopram Hydrobromide     REACTION: unspecified  . Penicillins     REACTION: questionable    Past Medical History  Diagnosis Date  . Allergy   . Hyperlipidemia   . Abnormal MRI 2007    nonspecific white matter abnormalities  . GERD (gastroesophageal reflux disease)     Past Surgical History  Procedure Laterality Date  . Ureterolithotomy  11/2001  . US echocardiography  11/2004    normal, Carotids normal    Family History  Problem Relation Age of Onset  . Diabetes Father   . Heart disease Father   . Dementia Father   . CAD Neg Hx   . Hypertension Neg Hx   . Cancer Neg Hx   . Dementia Paternal Grandmother     Social History   Social History  . Marital Status: Married    Spouse Name: N/A  . Number of Children: N/A  . Years of Education: N/A   Occupational History  . 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 Smoking status: Never Smoker   . Smokeless tobacco: Never Used  . Alcohol Use: 0.0 oz/week    0 Standard drinks or equivalent per week   Comment: rare  . Drug Use: No  . Sexual Activity: Not on file   Other Topics Concern  . Not on file   Social History Narrative   Long standing relationship with partner--finally able to marry   Review of Systems Appetite is okay Bowels are generally okay No N/V    Objective:   Physical Exam  Constitutional: He appears well-developed and well-nourished. No distress.  Genitourinary:  No external hemorrhoids or lesions No rectal masses Some stool which is heme negative          Assessment & Plan:

## 2015-02-13 NOTE — Assessment & Plan Note (Signed)
Clearly seems to be hemorrhoidal Stool is heme negative Benign colon in 2009 Discussed symptom care

## 2015-10-23 ENCOUNTER — Encounter: Payer: BC Managed Care – PPO | Admitting: Internal Medicine

## 2015-11-18 ENCOUNTER — Encounter: Payer: Self-pay | Admitting: Internal Medicine

## 2015-11-18 ENCOUNTER — Ambulatory Visit (INDEPENDENT_AMBULATORY_CARE_PROVIDER_SITE_OTHER): Payer: BC Managed Care – PPO | Admitting: Internal Medicine

## 2015-11-18 DIAGNOSIS — Z0000 Encounter for general adult medical examination without abnormal findings: Secondary | ICD-10-CM

## 2015-11-18 DIAGNOSIS — J301 Allergic rhinitis due to pollen: Secondary | ICD-10-CM

## 2015-11-18 DIAGNOSIS — E785 Hyperlipidemia, unspecified: Secondary | ICD-10-CM

## 2015-11-18 MED ORDER — FLUTICASONE PROPIONATE 50 MCG/ACT NA SUSP: 2.0000 | Freq: Every day | NASAL | 12 refills | Status: AC

## 2015-11-18 NOTE — Assessment & Plan Note (Signed)
Some eustachian tube issues Will add back flonase

## 2015-11-18 NOTE — Assessment & Plan Note (Signed)
Healthy Doing better with exercise Td and colonoscopy due 2019 Discussed PSA--will hold off again

## 2015-11-18 NOTE — Progress Notes (Signed)
Subjective:    Patient ID: Cameron [name], male    DOB: 1955-12-17, 60 y.o.   MRN: [id number]  HPI Here for physical Getting ready for the semester to begin Lots of family issues this summer--- dad's Alzheimer's is worsening, friends advanced cancer is worsening  Has had some right ear stuffiness Using claritin D Notices pain with chewing---that improves Doesn't feel sick flonase in past--but not on now  No more rectal bleeding Has suppositories left over--but hasn't needed  Has never had recurrent neurologic symptoms Does notice some balance issues if exposed to bright or irregular lighting Current Outpatient Prescriptions on File Prior to Visit  Medication Sig Dispense Refill  . ANUCORT-HC 25 MG suppository UNWRAP AND INSERT 1 SUPPOSITORY INTO THERECTUM 3 TIMES DAILY AS NEEDED 30 suppository 1  . ranitidine (ZANTAC) 150 MG tablet Take 150 mg by mouth daily as needed.      No current facility-administered medications on file prior to visit.     Allergies  Allergen Reactions  . Citalopram Hydrobromide     REACTION: unspecified  . Penicillins     REACTION: questionable    Past Medical History:  Diagnosis Date  . Abnormal MRI 2007   nonspecific white matter abnormalities  . Allergy   . GERD (gastroesophageal reflux disease)   . Hyperlipidemia     Past Surgical History:  Procedure Laterality Date  . URETEROLITHOTOMY  11/2001  . US ECHOCARDIOGRAPHY  11/2004   normal, Carotids normal    Family History  Problem Relation Age of Onset  . Diabetes Father   . Heart disease Father   . Dementia Father   . CAD Neg Hx   . Hypertension Neg Hx   . Cancer Neg Hx   . Dementia Paternal Grandmother     Social History   Social History  . Marital status: Married    Spouse name: N/A  . Number of children: N/A  . Years of education: N/A   Occupational History  . 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 Smoking status: Never  Smoker  . Smokeless tobacco: Never Used  . Alcohol use 0.0 oz/week     Comment: rare  . Drug use: No  . Sexual activity: Not on file   Other Topics Concern  . Not on file   Social History Narrative   Long standing relationship with partner--finally able to marry   Review of Systems  Constitutional:       Has lost a few pounds with more regular exercise Wears seat belt  HENT: Negative for dental problem, hearing loss and tinnitus.        Keeps up with dentist  Eyes:       No diplopia or unilateral vision loss  Respiratory: Negative for cough, chest tightness and shortness of breath.   Cardiovascular: Negative for chest pain, palpitations and leg swelling.  Gastrointestinal: Negative for abdominal pain, constipation, nausea and vomiting.       No regular heartburn  Endocrine: Negative for polydipsia and polyuria.  Genitourinary: Negative for frequency and urgency.       Slight dribbling at most No sexual problems  Musculoskeletal: Negative for arthralgias and joint swelling.       Mild low back pain at times---icy hot or lidocaine patches help (rare for this)  Skin: Negative for rash.       No suspicious lesions  Allergic/Immunologic: Positive for environmental allergies. Negative for immunocompromised state.  Neurological: Negative  for dizziness, syncope, weakness, light-headedness and headaches.  Hematological: Negative for adenopathy. Does not bruise/bleed easily.  Psychiatric/Behavioral: Negative for dysphoric mood and sleep disturbance. The patient is not nervous/anxious.        Objective:   Physical Exam  Constitutional: He is oriented to person, place, and time. He appears well-developed and well-nourished. No distress.  HENT:  Head: Normocephalic and atraumatic.  Right Ear: External ear normal.  Left Ear: External ear normal.  Mouth/Throat: Oropharynx is clear and moist. No oropharyngeal exudate.  Eyes: Conjunctivae are normal. Pupils are equal, round, and  reactive to light.  Neck: Normal range of motion. Neck supple. No thyromegaly present.  Cardiovascular: Normal rate, regular rhythm, normal heart sounds and intact distal pulses.  Exam reveals no gallop.   No murmur heard. Pulmonary/Chest: Effort normal and breath sounds normal. No respiratory distress. He has no wheezes. He has no rales.  Abdominal: Soft. He exhibits no distension. There is no tenderness. There is no rebound and no guarding.  Musculoskeletal: He exhibits no edema or tenderness.  Lymphadenopathy:    He has no cervical adenopathy.  Neurological: He is alert and oriented to person, place, and time.  Skin: No rash noted. No erythema.  Psychiatric: He has a normal mood and affect. His behavior is normal.          Assessment & Plan:

## 2015-11-18 NOTE — Assessment & Plan Note (Signed)
Will hold off on primary prevention

## 2015-11-18 NOTE — Progress Notes (Signed)
Pre visit review using our clinic review tool, if applicable. No additional management support is needed unless otherwise documented below in the visit note. 

## 2016-01-29 ENCOUNTER — Other Ambulatory Visit: Payer: Self-pay | Admitting: Internal Medicine

## 2016-06-02 ENCOUNTER — Encounter: Payer: Self-pay | Admitting: Medical Oncology

## 2016-06-02 ENCOUNTER — Emergency Department: Payer: Worker's Compensation

## 2016-06-02 ENCOUNTER — Emergency Department
Admission: EM | Admit: 2016-06-02 | Discharge: 2016-06-02 | Disposition: A | Discharge status: Home / Self Care | Payer: Worker's Compensation | Attending: Emergency Medicine | Admitting: Emergency Medicine

## 2016-06-02 DIAGNOSIS — Y929 Unspecified place or not applicable: Secondary | ICD-10-CM | POA: Diagnosis not present

## 2016-06-02 DIAGNOSIS — S20212A Contusion of left front wall of thorax, initial encounter: Secondary | ICD-10-CM | POA: Insufficient documentation

## 2016-06-02 DIAGNOSIS — W109XXA Fall (on) (from) unspecified stairs and steps, initial encounter: Secondary | ICD-10-CM | POA: Insufficient documentation

## 2016-06-02 DIAGNOSIS — Y999 Unspecified external cause status: Secondary | ICD-10-CM | POA: Diagnosis not present

## 2016-06-02 DIAGNOSIS — S299XXA Unspecified injury of thorax, initial encounter: Secondary | ICD-10-CM | POA: Diagnosis present

## 2016-06-02 DIAGNOSIS — Y939 Activity, unspecified: Secondary | ICD-10-CM | POA: Insufficient documentation

## 2016-06-02 MED ORDER — IBUPROFEN 600 MG PO TABS: 600.0000 mg | ORAL_TABLET | Freq: Three times a day (TID) | ORAL | 0 refills | Status: DC | PRN

## 2016-06-02 MED ORDER — TRAMADOL HCL 50 MG PO TABS: 50.0000 mg | ORAL_TABLET | Freq: Four times a day (QID) | ORAL | 0 refills | Status: DC | PRN

## 2016-06-02 NOTE — ED Triage Notes (Signed)
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 Pt reports left side rib cage/back pain. Pt in NAD.

## 2016-06-02 NOTE — ED Notes (Signed)
See triage note  [address] back from chair  Hit left rib area

## 2016-06-02 NOTE — ED Provider Notes (Signed)
[facility] Emergency Department Provider Note   ____________________________________________   None    (approximate)  I have reviewed the triage vital signs and the nursing notes.   HISTORY  Chief Complaint Fall and Back Pain    HPI Cameron [name] is a 61 y.o. male patient complain left lateral posterior lower rib pain secondary to contusion. 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 Patient complain of pain with palpation and with deep inspirations. Patient denies any shortness of breath or anterior chest wall pain. No positive Mejia for his complaint. Incident occurred prior to arrival.Patient rates his pain as a 7/10. Patient described a pain as as "sharp and achy".   Past Medical History:  Diagnosis Date  . Abnormal MRI 2007   nonspecific white matter abnormalities  . Allergy   . GERD (gastroesophageal reflux disease)   . Hyperlipidemia     Patient Active Problem List   Diagnosis Date Noted  . Rectal bleeding 02/13/2015  . Low back pain 05/29/2014  . Routine general medical examination at a health care facility 02/23/2012  . GERD (gastroesophageal reflux disease)   . Hyperlipemia 10/03/2007  . Seasonal allergic rhinitis due to pollen 07/19/2006  . RENAL CALCULUS 07/19/2006    Past Surgical History:  Procedure Laterality Date  . URETEROLITHOTOMY  11/2001  . US ECHOCARDIOGRAPHY  11/2004   normal, Carotids normal    Prior to Admission medications   Medication Sig Start Date End Date Taking? Authorizing Provider  ANUCORT-HC 25 MG suppository UNWRAP AND INSERT 1 SUPPOSITORY INTO THERECTUM 3 TIMES DAILY AS NEEDED 01/06/15   Venia Carbon, MD  ANUCORT-HC 25 MG suppository UNWRAP AND INSERT 1 SUPPOSITORY INTO THERECTUM 3 TIMES DAILY AS NEEDED 01/29/16   Venia Carbon, MD  fluticasone (FLONASE) 50 MCG/ACT nasal spray Place 2 sprays into both nostrils daily. In each nostril  11/18/15   Venia Carbon, MD  ibuprofen (ADVIL,MOTRIN) 600 MG tablet Take 1 tablet (600 mg total) by mouth every 8 (eight) hours as needed. 06/02/16   Sable Feil, PA-C  loratadine-pseudoephedrine (CLARITIN-D 12-HOUR) 5-120 MG tablet Take 1 tablet by mouth 2 (two) times daily.    Historical Provider, MD  ranitidine (ZANTAC) 150 MG tablet Take 150 mg by mouth daily as needed.     Historical Provider, MD  traMADol (ULTRAM) 50 MG tablet Take 1 tablet (50 mg total) by mouth every 6 (six) hours as needed for moderate pain. 06/02/16   Sable Feil, PA-C    Allergies Citalopram hydrobromide and Penicillins  Family History  Problem Relation Age of Onset  . Diabetes Father   . Heart disease Father   . Dementia Father   . Dementia Paternal Grandmother   . CAD Neg Hx   . Hypertension Neg Hx   . Cancer Neg Hx     Social History Social History  Substance Use Topics  . Smoking status: Never Smoker  . Smokeless tobacco: Never Used  . Alcohol use 0.0 oz/week     Comment: rare    Review of Systems Constitutional: No fever/chills Eyes: No visual changes. ENT: No sore throat. Cardiovascular: Denies chest pain. Respiratory: Denies shortness of breath. Gastrointestinal: No abdominal pain.  No nausea, no vomiting.  No diarrhea.  No constipation. Genitourinary: Negative for dysuria. Musculoskeletal: Left lateral posterior rib pain.  Skin: Negative for rash. Neurological: Negative for headaches, focal weakness or numbness. Endocrine:Hyperlipidemia Allergic/Immunilogical: See  medication list.  ____________________________________________   PHYSICAL EXAM:  VITAL SIGNS: ED Triage Vitals [06/02/16 1017]  Enc Vitals Group     BP 128/88     Pulse Rate 75     Resp 17     Temp 97.9 F (36.6 C)     Temp Source Oral     SpO2 99 %     Weight 175 lb (79.4 kg)     Height 5\' 11"  (1.803 m)     Head Circumference      Peak Flow      Pain Score 7     Pain Loc      Pain Edu?      Excl.  in [place]?     Constitutional: Alert and oriented. Well appearing and in no acute distress. Eyes: Conjunctivae are normal. PERRL. EOMI. Head: Atraumatic. Nose: No congestion/rhinnorhea. Mouth/Throat: Mucous membranes are moist.  Oropharynx non-erythematous. Neck: No stridor.  No cervical spine tenderness to palpation. Hematological/Lymphatic/Immunilogical: No cervical lymphadenopathy. Cardiovascular: Normal rate, regular rhythm. Grossly normal heart sounds.  Good peripheral circulation. Respiratory: Normal respiratory effort.  No retractions. Lungs CTAB. Gastrointestinal: Soft and nontender. No distention. No abdominal bruits. No CVA tenderness. Musculoskeletal: No obvious free of deformity. Moderate guarding palpation ribs number Neurologic:  Normal speech and language. No gross focal neurologic deficits are appreciated. No gait instability. Skin:  Skin is warm, dry and intact. No rash noted. No abrasion or ecchymosis. Psychiatric: Mood and affect are normal. Speech and behavior are normal.  ____________________________________________   LABS (all labs ordered are listed, but only abnormal results are displayed)  Labs Reviewed - No data to display ____________________________________________  EKG   ____________________________________________  RADIOLOGY   __No acute findings left rib x-rays. __________________________________________   PROCEDURES  Procedure(s) performed: None  Procedures  Critical Care performed: No  ____________________________________________   INITIAL IMPRESSION / ASSESSMENT AND PLAN / ED COURSE  Pertinent labs & imaging results that were available during my care of the patient were reviewed by me and considered in my medical decision making (see chart for details).  Left posterior rib contusion. Discussed negative x-ray findings with patient. Patient given discharge care instructions. Patient given a prescription for tramadol and ibuprofen.  Patient advised follow-up family doctor if no improvement within 3-5 days. Return by ER if condition worsens.  Clinical Course as of Jun 03 1107  Thu Jun 02, 2016  1037 DG Ribs Unilateral W/Chest Left [RS]  1038 DG Ribs Unilateral W/Chest Left [RS]  1039 DG Ribs Unilateral W/Chest Left [RS]  1039 DG Ribs Unilateral W/Chest Left [RS]    Clinical Course User Index [RS] Windy Carina, Student-PA     ____________________________________________   FINAL CLINICAL IMPRESSION(S) / ED DIAGNOSES  Final diagnoses:  Rib contusion, left, initial encounter      NEW MEDICATIONS STARTED DURING THIS VISIT:  New Prescriptions   IBUPROFEN (ADVIL,MOTRIN) 600 MG TABLET    Take 1 tablet (600 mg total) by mouth every 8 (eight) hours as needed.   TRAMADOL (ULTRAM) 50 MG TABLET    Take 1 tablet (50 mg total) by mouth every 6 (six) hours as needed for moderate pain.     Note:  This document was prepared using Dragon voice recognition software and may include unintentional dictation errors.    Sable Feil, PA-C 06/02/16 [place] Yao, MD 06/03/16 1044

## 2016-06-02 NOTE — ED Notes (Signed)
Pt. Requested to file workers comp. Called the HR department for [employer] at [phone] and was told by Shane Crutch that no drug screen needed to be done.

## 2016-06-03 ENCOUNTER — Telehealth: Payer: Self-pay

## 2016-06-03 NOTE — Telephone Encounter (Signed)
Pt returned call - he is in meetings all day.  He says that everything went well and he is improving.

## 2016-06-03 NOTE — Telephone Encounter (Signed)
Left message to call office to see how he is feeling after his ER visit for back pain

## 2016-06-13 ENCOUNTER — Encounter: Payer: Self-pay | Admitting: Internal Medicine

## 2016-06-13 ENCOUNTER — Ambulatory Visit (INDEPENDENT_AMBULATORY_CARE_PROVIDER_SITE_OTHER): Payer: BC Managed Care – PPO | Admitting: Internal Medicine

## 2016-06-13 VITALS — BP 108/82 | HR 69 | Temp 98.1°F | Wt 177.0 lb

## 2016-06-13 DIAGNOSIS — S20212A Contusion of left front wall of thorax, initial encounter: Secondary | ICD-10-CM | POA: Diagnosis not present

## 2016-06-13 NOTE — Progress Notes (Signed)
Subjective:    Patient ID: Cameron [name], male    DOB: 23-Nov-1955, 61 y.o.   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 Can sneeze, etc--with only pressure instead of stabbing pain  Taking ibuprofen 600mg   bid Took a few tramadol--but not much anymore  Current Outpatient Prescriptions on File Prior to Visit  Medication Sig Dispense Refill  . ANUCORT-HC 25 MG suppository UNWRAP AND INSERT 1 SUPPOSITORY INTO THERECTUM 3 TIMES DAILY AS NEEDED 30 suppository 5  . fluticasone (FLONASE) 50 MCG/ACT nasal spray Place 2 sprays into both nostrils daily. In each nostril 16 g 12  . ibuprofen (ADVIL,MOTRIN) 600 MG tablet Take 1 tablet (600 mg total) by mouth every 8 (eight) hours as needed. 15 tablet 0  . ranitidine (ZANTAC) 150 MG tablet Take 150 mg by mouth daily as needed.     . traMADol (ULTRAM) 50 MG tablet Take 1 tablet (50 mg total) by mouth every 6 (six) hours as needed for moderate pain. 12 tablet 0   No current facility-administered medications on file prior to visit.     Allergies  Allergen Reactions  . Citalopram Hydrobromide     REACTION: unspecified  . Penicillins     REACTION: questionable    Past Medical History:  Diagnosis Date  . Abnormal MRI 2007   nonspecific white matter abnormalities  . Allergy   . GERD (gastroesophageal reflux disease)   . Hyperlipidemia     Past Surgical History:  Procedure Laterality Date  . URETEROLITHOTOMY   11/2001  . US ECHOCARDIOGRAPHY  11/2004   normal, Carotids normal    Family History  Problem Relation Age of Onset  . Diabetes Father   . Heart disease Father   . Dementia Father   . Dementia Paternal Grandmother   . CAD Neg Hx   . Hypertension Neg Hx   . Cancer Neg Hx     Social History   Social History  . Marital status: Married    Spouse name: N/A  . Number of children: N/A  . Years of education: N/A   Occupational History  . 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 Smoking status: Never Smoker  . Smokeless tobacco: Never Used  . Alcohol use 0.0 oz/week     Comment: rare  . Drug use: No  . Sexual activity: Not on file   Other Topics Concern  . Not on file   Social History Narrative   Long standing relationship with partner--finally able to marry   Review of Systems No fever No persistent cough or SOB Appetite is fine No blood in stool or urine    Objective:   Physical Exam  Cardiovascular: Normal rate, regular rhythm and normal heart sounds.  Exam reveals no gallop.   No murmur heard. Pulmonary/Chest: Effort normal and breath sounds normal. No respiratory distress. He has no wheezes. He has no rales.  Fairly sig rib tenderness along posterior left ribs T9-11 and around to anterior axillary line. No obvious anatomic abnormality          Assessment & Plan:

## 2016-06-13 NOTE — Assessment & Plan Note (Signed)
Reviewed x-ray Has clearly improved Will continue ibuprofen--- can consider trying heat or lidocaine Doesn't really like the tramadol

## 2016-06-13 NOTE — Progress Notes (Signed)
Pre visit review using our clinic review tool, if applicable. No additional management support is needed unless otherwise documented below in the visit note. 

## 2016-11-21 ENCOUNTER — Encounter: Payer: Self-pay | Admitting: Internal Medicine

## 2016-11-21 ENCOUNTER — Ambulatory Visit (INDEPENDENT_AMBULATORY_CARE_PROVIDER_SITE_OTHER): Payer: BC Managed Care – PPO | Admitting: Internal Medicine

## 2016-11-21 VITALS — BP 110/80 | HR 78 | Temp 97.6°F | Ht 70.75 in | Wt 179.0 lb

## 2016-11-21 DIAGNOSIS — Z0000 Encounter for general adult medical examination without abnormal findings: Secondary | ICD-10-CM

## 2016-11-21 DIAGNOSIS — E785 Hyperlipidemia, unspecified: Secondary | ICD-10-CM

## 2016-11-21 DIAGNOSIS — K219 Gastro-esophageal reflux disease without esophagitis: Secondary | ICD-10-CM | POA: Diagnosis not present

## 2016-11-21 DIAGNOSIS — Z1211 Encounter for screening for malignant neoplasm of colon: Secondary | ICD-10-CM | POA: Diagnosis not present

## 2016-11-21 LAB — CBC WITH DIFFERENTIAL/PLATELET
BASOS PCT: 0 %
Basophils Absolute: 0 cells/uL (ref 0–200)
EOS PCT: 0 %
Eosinophils Absolute: 0 cells/uL — ABNORMAL LOW (ref 15–500)
HCT: 45 % (ref 38.5–50.0)
Hemoglobin: 15.6 g/dL (ref 13.2–17.1)
LYMPHS PCT: 27 %
Lymphs Abs: 2430 cells/uL (ref 850–3900)
MCH: 31.2 pg (ref 27.0–33.0)
MCHC: 34.7 g/dL (ref 32.0–36.0)
MCV: 90 fL (ref 80.0–100.0)
MPV: 11.3 fL (ref 7.5–12.5)
Monocytes Absolute: 810 cells/uL (ref 200–950)
Monocytes Relative: 9 %
NEUTROS PCT: 64 %
Neutro Abs: 5760 cells/uL (ref 1500–7800)
PLATELETS: 247 10*3/uL (ref 140–400)
RBC: 5 MIL/uL (ref 4.20–5.80)
RDW: 12.6 % (ref 11.0–15.0)
WBC: 9 10*3/uL (ref 3.8–10.8)

## 2016-11-21 NOTE — Assessment & Plan Note (Signed)
Healthy Discussed PSA---decided to defer again Doesn't want colon---will do FIT

## 2016-11-21 NOTE — Assessment & Plan Note (Signed)
Controlled with H2 blocker 

## 2016-11-21 NOTE — Progress Notes (Signed)
Subjective:    Patient ID: Cameron [name], male    DOB: 27-Sep-1955, 61 y.o.   MRN: [id number]  HPI Here for physical Starting the new semester No new concerns  Did have some generalized anxiety in the winter Better with increased exercise and anxiety Dad still with Altzheimers Health issues with partner's parents, etc etc Partner's health is "unpredictable"  No rectal bleeding at all  Current Outpatient Prescriptions on File Prior to Visit  Medication Sig Dispense Refill  . ANUCORT-HC 25 MG suppository UNWRAP AND INSERT 1 SUPPOSITORY INTO THERECTUM 3 TIMES DAILY AS NEEDED 30 suppository 5  . fluticasone (FLONASE) 50 MCG/ACT nasal spray Place 2 sprays into both nostrils daily. In each nostril 16 g 12  . ibuprofen (ADVIL,MOTRIN) 600 MG tablet Take 1 tablet (600 mg total) by mouth every 8 (eight) hours as needed. 15 tablet 0  . ranitidine (ZANTAC) 150 MG tablet Take 150 mg by mouth daily as needed.      No current facility-administered medications on file prior to visit.     Allergies  Allergen Reactions  . Citalopram Hydrobromide     REACTION: unspecified  . Penicillins     REACTION: questionable    Past Medical History:  Diagnosis Date  . Abnormal MRI 2007   nonspecific white matter abnormalities  . Allergy   . GERD (gastroesophageal reflux disease)   . Hyperlipidemia     Past Surgical History:  Procedure Laterality Date  . URETEROLITHOTOMY  11/2001  . US ECHOCARDIOGRAPHY  11/2004   normal, Carotids normal    Family History  Problem Relation Age of Onset  . Diabetes Father   . Heart disease Father   . Dementia Father   . Dementia Paternal Grandmother   . CAD Neg Hx   . Hypertension Neg Hx   . Cancer Neg Hx     Social History   Social History  . Marital status: Married    Spouse name: N/A  . Number of children: N/A  . Years of education: N/A   Occupational History  . 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 Smoking status: Never Smoker  . Smokeless tobacco: Never Used  . Alcohol use 0.0 oz/week     Comment: rare  . Drug use: No  . Sexual activity: Not on file   Other Topics Concern  . Not on file   Social History Narrative   Long standing relationship with partner--finally able to marry   Review of Systems  Constitutional: Negative for fatigue and unexpected weight change.       Wears seat belt  HENT: Negative for dental problem, hearing loss, tinnitus and trouble swallowing.        Keeps up with dentist  Eyes: Negative for visual disturbance.       No diplopia or unilateral vision loss  Respiratory: Negative for cough, chest tightness and shortness of breath.   Cardiovascular: Positive for palpitations. Negative for chest pain and leg swelling.       May have caffeine induced palpitations--- or from anxiety  Gastrointestinal: Negative for abdominal pain, anal bleeding, blood in stool, constipation and nausea.       No heartburn on daily ranitidine  Endocrine: Negative for polydipsia and polyuria.  Genitourinary: Positive for frequency. Negative for difficulty urinating and urgency.       No ED  Musculoskeletal: Negative for arthralgias and joint swelling.       Intermittent low  back pain--better with core work  Skin: Negative for rash.       No suspicious skin lesions  Allergic/Immunologic: Positive for environmental allergies. Negative for immunocompromised state.       Flonase in season  Neurological: Negative for dizziness, syncope, light-headedness and headaches.  Hematological: Negative for adenopathy. Does not bruise/bleed easily.  Psychiatric/Behavioral: Negative for dysphoric mood. The patient is nervous/anxious.        Limited sleep---partner disturbs him       Objective:   Physical Exam  Constitutional: He is oriented to person, place, and time. He appears well-developed and well-nourished. No distress.  HENT:  Head: Normocephalic and atraumatic.  Right Ear:  External ear normal.  Left Ear: External ear normal.  Mouth/Throat: Oropharynx is clear and moist. No oropharyngeal exudate.  Eyes: Pupils are equal, round, and reactive to light. Conjunctivae are normal.  Neck: Normal range of motion. Neck supple. No thyromegaly present.  Cardiovascular: Normal rate, regular rhythm, normal heart sounds and intact distal pulses.  Exam reveals no gallop.   No murmur heard. Pulmonary/Chest: Effort normal and breath sounds normal. No respiratory distress. He has no wheezes. He has no rales.  Abdominal: Soft. He exhibits no distension. There is no tenderness. There is no rebound and no guarding.  Musculoskeletal: He exhibits no edema or tenderness.  Lymphadenopathy:    He has no cervical adenopathy.  Neurological: He is alert and oriented to person, place, and time.  Skin: No rash noted. No erythema.  Psychiatric: He has a normal mood and affect. His behavior is normal.          Assessment & Plan:

## 2016-11-21 NOTE — Assessment & Plan Note (Signed)
Still doesn't want primary prevention with statin

## 2016-11-22 LAB — LIPID PANEL
CHOLESTEROL: 248 mg/dL — AB (ref <200)
HDL: 43 mg/dL (ref >40)
LDL Cholesterol: 155 mg/dL — ABNORMAL HIGH (ref <100)
TRIGLYCERIDES: 249 mg/dL — AB (ref <150)
Total CHOL/HDL Ratio: 5.8 Ratio — ABNORMAL HIGH (ref <5.0)
VLDL: 50 mg/dL — ABNORMAL HIGH (ref <30)

## 2016-11-22 LAB — COMPREHENSIVE METABOLIC PANEL
ALBUMIN: 4.4 g/dL (ref 3.6–5.1)
ALT: 13 U/L (ref 9–46)
AST: 19 U/L (ref 10–35)
Alkaline Phosphatase: 65 U/L (ref 40–115)
BILIRUBIN TOTAL: 0.5 mg/dL (ref 0.2–1.2)
BUN: 17 mg/dL (ref 7–25)
CHLORIDE: 101 mmol/L (ref 98–110)
CO2: 24 mmol/L (ref 20–32)
CREATININE: 1.13 mg/dL (ref 0.70–1.25)
Calcium: 9.4 mg/dL (ref 8.6–10.3)
GLUCOSE: 84 mg/dL (ref 65–99)
Potassium: 4.6 mmol/L (ref 3.5–5.3)
SODIUM: 136 mmol/L (ref 135–146)
Total Protein: 7.4 g/dL (ref 6.1–8.1)

## 2016-12-13 ENCOUNTER — Other Ambulatory Visit (INDEPENDENT_AMBULATORY_CARE_PROVIDER_SITE_OTHER): Payer: BC Managed Care – PPO

## 2016-12-13 DIAGNOSIS — Z1211 Encounter for screening for malignant neoplasm of colon: Secondary | ICD-10-CM

## 2016-12-13 LAB — FECAL OCCULT BLOOD, IMMUNOCHEMICAL: Fecal Occult Bld: NEGATIVE

## 2017-03-06 DIAGNOSIS — H6993 Unspecified Eustachian tube disorder, bilateral: Secondary | ICD-10-CM | POA: Insufficient documentation

## 2017-03-06 DIAGNOSIS — H903 Sensorineural hearing loss, bilateral: Secondary | ICD-10-CM | POA: Insufficient documentation

## 2017-03-06 DIAGNOSIS — H6122 Impacted cerumen, left ear: Secondary | ICD-10-CM | POA: Insufficient documentation

## 2017-04-25 ENCOUNTER — Ambulatory Visit: Payer: BC Managed Care – PPO | Admitting: Internal Medicine

## 2017-04-25 ENCOUNTER — Encounter: Payer: Self-pay | Admitting: Internal Medicine

## 2017-04-25 VITALS — BP 112/78 | HR 69 | Temp 98.2°F | Wt 184.0 lb

## 2017-04-25 DIAGNOSIS — J3089 Other allergic rhinitis: Secondary | ICD-10-CM | POA: Diagnosis not present

## 2017-04-25 MED ORDER — HYDROCODONE-HOMATROPINE 5-1.5 MG/5ML PO SYRP: 5.0000 mL | ORAL_SOLUTION | Freq: Three times a day (TID) | ORAL | 0 refills | Status: DC | PRN

## 2017-04-25 NOTE — Progress Notes (Signed)
HPI  Pt presents to the clinic today with c/o post nasal drip and cough. This started 2-3 weeks ago. The cough is productive of white mucous. He denies runny nose, nasal congestion, ear pain or sore throat. He went to [facility] for the same. He was given Tessalon, Hycodan and Cefdinir. He reports he did not take the Cefdinir because he is allergic to PCN and the pharmacist said they were related. He has also tried Mucinex and Flonase with minimal relief. He has a history of allergies but denies breathing problems. He has not had sick contacts.  Review of Systems      Past Medical History:  Diagnosis Date  . Abnormal MRI 2007   nonspecific white matter abnormalities  . Allergy   . GERD (gastroesophageal reflux disease)   . Hyperlipidemia     Family History  Problem Relation Age of Onset  . Diabetes Father   . Heart disease Father   . Dementia Father   . Dementia Paternal Grandmother   . CAD Neg Hx   . Hypertension Neg Hx   . Cancer Neg Hx     Social History   Socioeconomic History  . Marital status: Married    Spouse name: Not on file  . Number of children: Not on file  . Years of education: Not on file  . Highest education level: Not on file  Social Needs  . Financial resource strain: Not on file  . Food insecurity - worry: Not on file  . Food insecurity - inability: Not on file  . Transportation needs - medical: Not on file  . Transportation needs - non-medical: Not on file  Occupational History  . 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 Smoking status: Never Smoker  . Smokeless tobacco: Never Used  Substance and Sexual Activity  . Alcohol use: Yes    Alcohol/week: 0.0 oz    Comment: rare  . Drug use: No  . Sexual activity: Not on file  Other Topics Concern  . Not on file  Social History Narrative   Long standing relationship with partner--finally able to marry    Allergies  Allergen Reactions  . Citalopram  Hydrobromide     REACTION: unspecified  . Penicillins     REACTION: questionable     Constitutional: Denies headache, fatigue, fever or abrupt weight changes.  HEENT:  Denies eye redness, eye pain, pressure behind the eyes, facial pain, nasal congestion, ear pain, ringing in the ears, wax buildup, runny nose or bloody nose. Respiratory: Positive cough. Denies difficulty breathing or shortness of breath.  Cardiovascular: Denies chest pain, chest tightness, palpitations or swelling in the hands or feet.   No other specific complaints in a complete review of systems (except as listed in HPI above).  Objective:   BP 112/78   Pulse 69   Temp 98.2 F (36.8 C) (Oral)   Wt 184 lb (83.5 kg)   SpO2 97%   BMI 25.84 kg/m  Wt Readings from Last 3 Encounters:  04/25/17 184 lb (83.5 kg)  11/21/16 179 lb (81.2 kg)  06/13/16 177 lb (80.3 kg)     General: Appears his stated age, well developed, well nourished in NAD. HEENT: Head: normal shape and size, no sinus tenderness noted; Ears: Tm's gray and intact, normal light reflex; Nose: mucosa pink and moist, septum midline; Throat/Mouth: + PND. Teeth present, mucosa erythematous and moist, no exudate noted, no lesions or ulcerations noted.  Neck: No cervical lymphadenopathy.  Pulmonary/Chest: Normal effort and positive vesicular breath sounds. No respiratory distress. No wheezes, rales or ronchi noted.       Assessment & Plan:   Allergic Rhinitis:  Get some rest and drink plenty of water Continue Flonase Add is Zyrtec No indication for abx at this time eRx for Hycodan cough syrup  RTC as needed or if symptoms persist.   Webb Silversmith, NP

## 2017-04-25 NOTE — Patient Instructions (Signed)

## 2017-07-03 ENCOUNTER — Encounter: Payer: Self-pay | Admitting: Internal Medicine

## 2017-11-03 ENCOUNTER — Other Ambulatory Visit: Payer: Self-pay | Admitting: Internal Medicine

## 2017-12-22 ENCOUNTER — Telehealth: Payer: Self-pay

## 2017-12-22 ENCOUNTER — Ambulatory Visit: Payer: BC Managed Care – PPO | Admitting: Internal Medicine

## 2017-12-22 ENCOUNTER — Encounter: Payer: Self-pay | Admitting: Internal Medicine

## 2017-12-22 VITALS — BP 118/70 | HR 70 | Temp 97.5°F | Ht 71.0 in | Wt 179.0 lb

## 2017-12-22 DIAGNOSIS — R002 Palpitations: Secondary | ICD-10-CM

## 2017-12-22 DIAGNOSIS — F419 Anxiety disorder, unspecified: Secondary | ICD-10-CM | POA: Insufficient documentation

## 2017-12-22 LAB — CBC
HEMATOCRIT: 43.8 % (ref 39.0–52.0)
Hemoglobin: 15.3 g/dL (ref 13.0–17.0)
MCHC: 35 g/dL (ref 30.0–36.0)
MCV: 90.8 fl (ref 78.0–100.0)
Platelets: 249 10*3/uL (ref 150.0–400.0)
RBC: 4.83 Mil/uL (ref 4.22–5.81)
RDW: 12.8 % (ref 11.5–15.5)
WBC: 6.3 10*3/uL (ref 4.0–10.5)

## 2017-12-22 LAB — T4, FREE: FREE T4: 0.65 ng/dL (ref 0.60–1.60)

## 2017-12-22 LAB — COMPREHENSIVE METABOLIC PANEL
ALBUMIN: 4.3 g/dL (ref 3.5–5.2)
ALK PHOS: 64 U/L (ref 39–117)
ALT: 11 U/L (ref 0–53)
AST: 19 U/L (ref 0–37)
BILIRUBIN TOTAL: 0.6 mg/dL (ref 0.2–1.2)
BUN: 15 mg/dL (ref 6–23)
CHLORIDE: 105 meq/L (ref 96–112)
CO2: 30 mEq/L (ref 19–32)
CREATININE: 1.07 mg/dL (ref 0.40–1.50)
Calcium: 9.5 mg/dL (ref 8.4–10.5)
GFR: 74.59 mL/min (ref >60.00)
Glucose, Bld: 84 mg/dL (ref 70–99)
Potassium: 4.6 mEq/L (ref 3.5–5.1)
SODIUM: 139 meq/L (ref 135–145)
TOTAL PROTEIN: 7.5 g/dL (ref 6.0–8.3)

## 2017-12-22 NOTE — Telephone Encounter (Signed)
I spoke with pt; for 3-4 wks having heart flutters on and off. No CP, H/A, dizziness and "slight SOB but not really"the patient said has been more continuous since last night and wants to be seen ASAP. Pt scheduled appt 12/22/17 @ 11:15. If pt condition worsened prior to appt pt will go to ED. Pt voiced understanding.

## 2017-12-22 NOTE — Assessment & Plan Note (Addendum)
Not racing heart--just skipping More prominent spell last night---at rest as usual but worse--and associated with sensation of hard time getting in a full breath Same sensation on walk this morning--but not the skipping heart EKG shows sinus at 63 Normal axis and intervals. No ischemic changes No comparison available  Doesn't sound like any concern for ischemia Doubt true arrhythmia like SVT Likely PACs --though not on EKG Will check labs Consider stress test if more exercise symptoms Clearly has an anxiety component--but not sufficient to warrant medications now---may need to reconsider (did do well on buspar decades ago)

## 2017-12-22 NOTE — Telephone Encounter (Signed)
Copied from [place] [id number]. Topic: General - Other >> Dec 22, 2017  8:45 AM Cameron [name] wrote:  Pt said he only want to see DR Silvio Pate he think he has a heart arrhythmia and would like to come in asap   [phone]

## 2017-12-22 NOTE — Telephone Encounter (Signed)
Will do evaluation and EKG at visit

## 2017-12-22 NOTE — Progress Notes (Signed)
Subjective:    Patient ID: Cameron [name], male    DOB: [birth year], 62 y.o.   MRN: [id number]  HPI Here due to palpitations  He has had some spells with anxiety Has noticed some brief palpitations over the past 2 months At rest---just irregular and not fast.  Has associated it with anxiety Now occurring more frequently Had a more noticeable event last night after a rehearsal---came home and settled down and had sense "like I was holding my breath--not coming in". Heart was irregular. Just went to sleep Did his ~5 mile brisk walk this morning--breathing still not quite right Feels like he is not getting a full breath in---better with big breath/sigh  Doing some behavioral therapy (CBT) Does meditate and do joiurnaling No problems with job or relationship Lots of illness and deaths in their families  No chest pain No dizziness or syncope No edema  Current Outpatient Medications on File Prior to Visit  Medication Sig Dispense Refill  . ANUCORT-HC 25 MG suppository UNWRAP AND INSERT 1 SUPPOSITORY INTO THE RECTUM 3 TIMES DAILY AS NEEDED 30 suppository 0  . esomeprazole (NEXIUM) 20 MG capsule Take 20 mg by mouth daily at 12 noon.    . fluticasone (FLONASE) 50 MCG/ACT nasal spray Place 2 sprays into both nostrils daily. In each nostril 16 g 12  . ibuprofen (ADVIL,MOTRIN) 600 MG tablet Take 1 tablet (600 mg total) by mouth every 8 (eight) hours as needed. 15 tablet 0   No current facility-administered medications on file prior to visit.     Allergies  Allergen Reactions  . Citalopram Hydrobromide     REACTION: unspecified  . Penicillins     REACTION: questionable    Past Medical History:  Diagnosis Date  . Abnormal MRI 2007   nonspecific white matter abnormalities  . Allergy   . GERD (gastroesophageal reflux disease)   . Hyperlipidemia     Past Surgical History:  Procedure Laterality Date  . URETEROLITHOTOMY  11/2001  . US ECHOCARDIOGRAPHY  11/2004   normal, Carotids  normal    Family History  Problem Relation Age of Onset  . Diabetes Father   . Heart disease Father   . Dementia Father   . Dementia Paternal Grandmother   . CAD Neg Hx   . Hypertension Neg Hx   . Cancer Neg Hx     Social History   Socioeconomic History  . Marital status: Married    Spouse name: Not on file  . Number of children: Not on file  . Years of education: Not on file  . Highest education level: Not on file  Occupational History  . 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 Financial resource strain: Not on file  . Food insecurity:    Worry: Not on file    Inability: Not on file  . Transportation needs:    Medical: Not on file    Non-medical: Not on file  Tobacco Use  . Smoking status: Never Smoker  . Smokeless tobacco: Never Used  Substance and Sexual Activity  . Alcohol use: Yes    Alcohol/week: 0.0 standard drinks    Comment: rare  . Drug use: No  . Sexual activity: Not on file  Lifestyle  . Physical activity:    Days per week: Not on file    Minutes per session: Not on file  . Stress: Not on file  Relationships  . Social connections:  Talks on phone: Not on file    Gets together: Not on file    Attends religious service: Not on file    Active member of club or organization: Not on file    Attends meetings of clubs or organizations: Not on file    Relationship status: Not on file  . Intimate partner violence:    Fear of current or ex partner: Not on file    Emotionally abused: Not on file    Physically abused: Not on file    Forced sexual activity: Not on file  Other Topics Concern  . Not on file  Social History Narrative   Long standing relationship with partner--finally able to marry   Review of Systems  Generally eating okay Has had some trouble sleeping in past 2 months---will awaken with anxiety. This is better over the past week or so Diet pepsi-- 2-3 per day. Not clearly  related to spells    Objective:   Physical Exam  Constitutional: He appears well-developed. No distress.  Neck: No thyromegaly present.  Cardiovascular: Normal rate, regular rhythm, normal heart sounds and intact distal pulses. Exam reveals no gallop.  No murmur heard. Respiratory: Effort normal and breath sounds normal. No respiratory distress. He has no wheezes. He has no rales.  GI: Soft. There is no tenderness.  Musculoskeletal: He exhibits no edema.  Lymphadenopathy:    He has no cervical adenopathy.  Psychiatric: He has a normal mood and affect. His behavior is normal.           Assessment & Plan:

## 2018-01-18 ENCOUNTER — Telehealth: Payer: Self-pay | Admitting: Internal Medicine

## 2018-01-18 MED ORDER — BUSPIRONE HCL 10 MG PO TABS: 10.0000 mg | ORAL_TABLET | Freq: Two times a day (BID) | ORAL | 1 refills | Status: DC

## 2018-01-18 NOTE — Telephone Encounter (Signed)
Please let him know I sent the prescription. Set him up for a follow up in ~1 month so we can review and decide about whether dose needs to be adjusted

## 2018-01-18 NOTE — Telephone Encounter (Signed)
Patient aware of message and scheduled for 11/5 @ 10:15.

## 2018-01-18 NOTE — Telephone Encounter (Signed)
Left message to call office.  If pt calls back, please advise him of Dr Alla German message and schedule him for an OV in 1 month to F/U. Thanks

## 2018-01-18 NOTE — Telephone Encounter (Signed)
Copied from [place] [id number]. Topic: Inquiry >> Jan 18, 2018  9:14 AM Conception Chancy, NT wrote: Reason for CRM: patient is calling and requesting to speak with Dr. Silvio Pate nurse, he states him and doctor talked about Buspirone and if he needed a prescription for it to just call. Patient would like it.  [id number] [place], [place] - [address] [place] [address] Phone: [phone] Fax: [phone]

## 2018-02-20 ENCOUNTER — Ambulatory Visit: Payer: BC Managed Care – PPO | Admitting: Internal Medicine

## 2018-02-20 ENCOUNTER — Encounter: Payer: Self-pay | Admitting: Internal Medicine

## 2018-02-20 VITALS — BP 110/72 | HR 65 | Temp 97.5°F | Ht 71.0 in | Wt 178.0 lb

## 2018-02-20 DIAGNOSIS — R002 Palpitations: Secondary | ICD-10-CM | POA: Diagnosis not present

## 2018-02-20 NOTE — Progress Notes (Signed)
Subjective:    Patient ID: Cameron [name], male    DOB: [birth year], 62 y.o.   MRN: [id number]  HPI Here for follow up of palpitations and anxiety  Continues on CBT and tolerating buspar ("better than I ever expected") Gets slight buzz after taking it in the morning ("can hear the blood in my body") No tired feeling  Palpitations are basically gone Anxiety is also better No depression "I feel quieter inside"  Current Outpatient Medications on File Prior to Visit  Medication Sig Dispense Refill  . ANUCORT-HC 25 MG suppository UNWRAP AND INSERT 1 SUPPOSITORY INTO THE RECTUM 3 TIMES DAILY AS NEEDED 30 suppository 0  . busPIRone (BUSPAR) 10 MG tablet Take 1 tablet (10 mg total) by mouth 2 (two) times daily. 60 tablet 1  . esomeprazole (NEXIUM) 20 MG capsule Take 20 mg by mouth daily at 12 noon.    . fluticasone (FLONASE) 50 MCG/ACT nasal spray Place 2 sprays into both nostrils daily. In each nostril 16 g 12  . ibuprofen (ADVIL,MOTRIN) 600 MG tablet Take 1 tablet (600 mg total) by mouth every 8 (eight) hours as needed. 15 tablet 0   No current facility-administered medications on file prior to visit.     Allergies  Allergen Reactions  . Citalopram Hydrobromide     REACTION: unspecified  . Penicillins     REACTION: questionable    Past Medical History:  Diagnosis Date  . Abnormal MRI 2007   nonspecific white matter abnormalities  . Allergy   . GERD (gastroesophageal reflux disease)   . Hyperlipidemia     Past Surgical History:  Procedure Laterality Date  . URETEROLITHOTOMY  11/2001  . US ECHOCARDIOGRAPHY  11/2004   normal, Carotids normal    Family History  Problem Relation Age of Onset  . Diabetes Father   . Heart disease Father   . Dementia Father   . Dementia Paternal Grandmother   . CAD Neg Hx   . Hypertension Neg Hx   . Cancer Neg Hx     Social History   Socioeconomic History  . Marital status: Married    Spouse name: Not on file  . Number of  children: Not on file  . Years of education: Not on file  . Highest education level: Not on file  Occupational History  . 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 Financial resource strain: Not on file  . Food insecurity:    Worry: Not on file    Inability: Not on file  . Transportation needs:    Medical: Not on file    Non-medical: Not on file  Tobacco Use  . Smoking status: Never Smoker  . Smokeless tobacco: Never Used  Substance and Sexual Activity  . Alcohol use: Yes    Alcohol/week: 0.0 standard drinks    Comment: rare  . Drug use: No  . Sexual activity: Not on file  Lifestyle  . Physical activity:    Days per week: Not on file    Minutes per session: Not on file  . Stress: Not on file  Relationships  . Social connections:    Talks on phone: Not on file    Gets together: Not on file    Attends religious service: Not on file    Active member of club or organization: Not on file    Attends meetings of clubs or organizations: Not on file    Relationship status:  Not on file  . Intimate partner violence:    Fear of current or ex partner: Not on file    Emotionally abused: Not on file    Physically abused: Not on file    Forced sexual activity: Not on file  Other Topics Concern  . Not on file  Social History Narrative   Long standing relationship with partner--finally able to marry   tReview of Systems Sleeping better---not awakening "in a sweat, painic" Appetite is stable or slightly down. No stress eating and less junk Huge concert cycle recently---affected exercise. Now getting back to more regular schedule    Objective:   Physical Exam  Constitutional: He appears well-developed. No distress.  Psychiatric: He has a normal mood and affect. His behavior is normal.           Assessment & Plan:

## 2018-02-20 NOTE — Assessment & Plan Note (Signed)
Better with treatment for the anxiety Tolerating the buspirone--will continue

## 2018-03-14 ENCOUNTER — Other Ambulatory Visit: Payer: Self-pay | Admitting: Internal Medicine

## 2018-04-23 ENCOUNTER — Encounter: Payer: Self-pay | Admitting: Internal Medicine

## 2018-04-23 ENCOUNTER — Ambulatory Visit (INDEPENDENT_AMBULATORY_CARE_PROVIDER_SITE_OTHER): Payer: BC Managed Care – PPO | Admitting: Internal Medicine

## 2018-04-23 VITALS — BP 110/76 | HR 73 | Temp 97.9°F | Ht 70.75 in | Wt 176.0 lb

## 2018-04-23 DIAGNOSIS — Z0000 Encounter for general adult medical examination without abnormal findings: Secondary | ICD-10-CM | POA: Diagnosis not present

## 2018-04-23 DIAGNOSIS — Z23 Encounter for immunization: Secondary | ICD-10-CM | POA: Diagnosis not present

## 2018-04-23 DIAGNOSIS — F419 Anxiety disorder, unspecified: Secondary | ICD-10-CM

## 2018-04-23 DIAGNOSIS — Z1211 Encounter for screening for malignant neoplasm of colon: Secondary | ICD-10-CM

## 2018-04-23 MED ORDER — HYDROCORTISONE 2.5 % EX CREA: TOPICAL_CREAM | Freq: Three times a day (TID) | CUTANEOUS | 3 refills | Status: DC | PRN

## 2018-04-23 MED ORDER — KETOCONAZOLE 2 % EX CREA: 1.0000 "application " | TOPICAL_CREAM | Freq: Two times a day (BID) | CUTANEOUS | 1 refills | Status: DC | PRN

## 2018-04-23 NOTE — Assessment & Plan Note (Signed)
Caused palpitations---better now with CBT and buspirone

## 2018-04-23 NOTE — Addendum Note (Signed)
Addended by: Pilar Grammes on: 04/23/2018 12:24 PM   Modules accepted: Orders

## 2018-04-23 NOTE — Assessment & Plan Note (Addendum)
Healthy Yearly flu vaccine Will do shingrix Stays fit FIT Still prefers no PSA Tetanus booster due

## 2018-04-23 NOTE — Progress Notes (Signed)
Subjective:    Patient ID: Cameron [name], male    DOB: 05-27-1955, 63 y.o.   MRN: [id number]  HPI Here for physical  Doing well with the buspirone Getting cognitive behavioral therapy as well Exercising regularly as well  Noticed a rash in his gluteal cleft Tried OTC lotrimin Still persisting though improved No changes in clothes, laundry, etc Tried old proctofoam (helped some but very old)  Current Outpatient Medications on File Prior to Visit  Medication Sig Dispense Refill  . ANUCORT-HC 25 MG suppository UNWRAP AND INSERT 1 SUPPOSITORY INTO THE RECTUM 3 TIMES DAILY AS NEEDED 30 suppository 0  . busPIRone (BUSPAR) 10 MG tablet TAKE 1 TABLET BY MOUTH TWICE A DAY 60 tablet 1  . esomeprazole (NEXIUM) 20 MG capsule Take 20 mg by mouth daily at 12 noon.    . fluticasone (FLONASE) 50 MCG/ACT nasal spray Place 2 sprays into both nostrils daily. In each nostril 16 g 12  . ibuprofen (ADVIL,MOTRIN) 600 MG tablet Take 1 tablet (600 mg total) by mouth every 8 (eight) hours as needed. 15 tablet 0   No current facility-administered medications on file prior to visit.     Allergies  Allergen Reactions  . Citalopram Hydrobromide     REACTION: unspecified  . Penicillins     REACTION: questionable    Past Medical History:  Diagnosis Date  . Abnormal MRI 2007   nonspecific white matter abnormalities  . Allergy   . GERD (gastroesophageal reflux disease)   . Hyperlipidemia     Past Surgical History:  Procedure Laterality Date  . URETEROLITHOTOMY  11/2001  . US ECHOCARDIOGRAPHY  11/2004   normal, Carotids normal    Family History  Problem Relation Age of Onset  . Diabetes Father   . Heart disease Father   . Dementia Father   . Dementia Paternal Grandmother   . CAD Neg Hx   . Hypertension Neg Hx   . Cancer Neg Hx     Social History   Socioeconomic History  . Marital status: Married    Spouse name: Not on file  . Number of children: Not on file  . Years of education:  Not on file  . Highest education level: Not on file  Occupational History  . 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 Financial resource strain: Not on file  . Food insecurity:    Worry: Not on file    Inability: Not on file  . Transportation needs:    Medical: Not on file    Non-medical: Not on file  Tobacco Use  . Smoking status: Never Smoker  . Smokeless tobacco: Never Used  Substance and Sexual Activity  . Alcohol use: Yes    Alcohol/week: 0.0 standard drinks    Comment: rare  . Drug use: No  . Sexual activity: Not on file  Lifestyle  . Physical activity:    Days per week: Not on file    Minutes per session: Not on file  . Stress: Not on file  Relationships  . Social connections:    Talks on phone: Not on file    Gets together: Not on file    Attends religious service: Not on file    Active member of club or organization: Not on file    Attends meetings of clubs or organizations: Not on file    Relationship status: Not on file  . Intimate partner violence:  Fear of current or ex partner: Not on file    Emotionally abused: Not on file    Physically abused: Not on file    Forced sexual activity: Not on file  Other Topics Concern  . Not on file  Social History Narrative   Long standing relationship with partner--finally able to marry     Review of Systems  Constitutional: Negative for fatigue and unexpected weight change.       Wears seat belt  HENT: Negative for dental problem, hearing loss and trouble swallowing.        Slight sense of hearing blood flow in ears (on the buspar) Keeps up with dentist  Eyes: Negative for visual disturbance.       No diplopia or unilateral vision loss  Respiratory: Negative for cough, chest tightness and shortness of breath.   Cardiovascular: Positive for palpitations. Negative for chest pain and leg swelling.  Gastrointestinal: Negative for abdominal pain, blood in  stool and constipation.       No heartburn  Endocrine: Negative for polydipsia and polyuria.  Genitourinary: Positive for frequency. Negative for difficulty urinating.       No sexual problems  Musculoskeletal: Negative for back pain and joint swelling.       Walks 20 miles per week Minor aches and pains  Skin:       No suspicious lesions  Allergic/Immunologic: Positive for environmental allergies. Negative for immunocompromised state.       Flonase helping  Neurological: Positive for dizziness. Negative for syncope and headaches.       Occ itching or burning on bottoms of feet after walking  Hematological: Negative for adenopathy. Does not bruise/bleed easily.  Psychiatric/Behavioral: Negative for dysphoric mood and sleep disturbance. The patient is nervous/anxious.        Partner's dad has improved His dad has dementia but doing okay Worried about partner--fell out of attic and has concussion       Objective:   Physical Exam  Constitutional: He is oriented to person, place, and time. No distress.  HENT:  Head: Normocephalic and atraumatic.  Right Ear: External ear normal.  Left Ear: External ear normal.  Mouth/Throat: Oropharynx is clear and moist. No oropharyngeal exudate.  Eyes: Pupils are equal, round, and reactive to light. Conjunctivae are normal.  Neck: No thyromegaly present.  Cardiovascular: Normal rate, regular rhythm, normal heart sounds and intact distal pulses. Exam reveals no gallop.  No murmur heard. Respiratory: Effort normal and breath sounds normal. No respiratory distress. He has no wheezes. He has no rales.  GI: Soft. There is no abdominal tenderness.  Musculoskeletal:        General: No tenderness or edema.  Lymphadenopathy:    He has no cervical adenopathy.  Neurological: He is alert and oriented to person, place, and time.  Skin:  Pale, mostly resolved rash in folds of upper buttocks (will Rx cream)  Psychiatric: He has a normal mood and affect. His  behavior is normal.           Assessment & Plan:

## 2018-05-01 ENCOUNTER — Telehealth: Payer: Self-pay | Admitting: Internal Medicine

## 2018-05-01 NOTE — Telephone Encounter (Signed)
Called to scheduled patient for Shingrix

## 2018-05-15 ENCOUNTER — Ambulatory Visit (INDEPENDENT_AMBULATORY_CARE_PROVIDER_SITE_OTHER): Payer: BC Managed Care – PPO

## 2018-05-15 ENCOUNTER — Other Ambulatory Visit: Payer: Self-pay | Admitting: Internal Medicine

## 2018-05-15 DIAGNOSIS — Z23 Encounter for immunization: Secondary | ICD-10-CM

## 2018-05-17 ENCOUNTER — Other Ambulatory Visit (INDEPENDENT_AMBULATORY_CARE_PROVIDER_SITE_OTHER): Payer: BC Managed Care – PPO

## 2018-05-17 DIAGNOSIS — Z1211 Encounter for screening for malignant neoplasm of colon: Secondary | ICD-10-CM | POA: Diagnosis not present

## 2018-05-17 LAB — FECAL OCCULT BLOOD, IMMUNOCHEMICAL: Fecal Occult Bld: NEGATIVE

## 2018-07-17 ENCOUNTER — Ambulatory Visit: Payer: BC Managed Care – PPO

## 2018-07-24 ENCOUNTER — Emergency Department: Payer: BC Managed Care – PPO

## 2018-07-24 ENCOUNTER — Telehealth: Payer: Self-pay

## 2018-07-24 ENCOUNTER — Encounter: Payer: Self-pay | Admitting: Emergency Medicine

## 2018-07-24 ENCOUNTER — Emergency Department
Admission: EM | Admit: 2018-07-24 | Discharge: 2018-07-24 | Disposition: A | Discharge status: Home / Self Care | Payer: BC Managed Care – PPO | Attending: Emergency Medicine | Admitting: Emergency Medicine

## 2018-07-24 ENCOUNTER — Telehealth: Payer: Self-pay | Admitting: Urology

## 2018-07-24 ENCOUNTER — Other Ambulatory Visit: Payer: Self-pay

## 2018-07-24 DIAGNOSIS — Z79899 Other long term (current) drug therapy: Secondary | ICD-10-CM | POA: Diagnosis not present

## 2018-07-24 DIAGNOSIS — N201 Calculus of ureter: Secondary | ICD-10-CM | POA: Insufficient documentation

## 2018-07-24 DIAGNOSIS — R1084 Generalized abdominal pain: Secondary | ICD-10-CM | POA: Diagnosis present

## 2018-07-24 LAB — CBC
HCT: 44.6 % (ref 39.0–52.0)
Hemoglobin: 15.6 g/dL (ref 13.0–17.0)
MCH: 31.3 pg (ref 26.0–34.0)
MCHC: 35 g/dL (ref 30.0–36.0)
MCV: 89.4 fL (ref 80.0–100.0)
Platelets: 263 10*3/uL (ref 150–400)
RBC: 4.99 MIL/uL (ref 4.22–5.81)
RDW: 12 % (ref 11.5–15.5)
WBC: 11.4 10*3/uL — ABNORMAL HIGH (ref 4.0–10.5)
nRBC: 0 % (ref 0.0–0.2)

## 2018-07-24 LAB — URINALYSIS, COMPLETE (UACMP) WITH MICROSCOPIC
Bacteria, UA: NONE SEEN
Bilirubin Urine: NEGATIVE
Glucose, UA: NEGATIVE mg/dL
Hgb urine dipstick: NEGATIVE
Ketones, ur: NEGATIVE mg/dL
Leukocytes,Ua: NEGATIVE
Nitrite: NEGATIVE
Protein, ur: NEGATIVE mg/dL
Specific Gravity, Urine: 1.018 (ref 1.005–1.030)
Squamous Epithelial / HPF: NONE SEEN (ref 0–5)
pH: 6 (ref 5.0–8.0)

## 2018-07-24 LAB — COMPREHENSIVE METABOLIC PANEL
ALT: 14 U/L (ref 0–44)
AST: 24 U/L (ref 15–41)
Albumin: 4.3 g/dL (ref 3.5–5.0)
Alkaline Phosphatase: 74 U/L (ref 38–126)
Anion gap: 11 (ref 5–15)
BUN: 15 mg/dL (ref 8–23)
CO2: 23 mmol/L (ref 22–32)
Calcium: 9 mg/dL (ref 8.9–10.3)
Chloride: 104 mmol/L (ref 98–111)
Creatinine, Ser: 1.36 mg/dL — ABNORMAL HIGH (ref 0.61–1.24)
GFR calc Af Amer: >60 mL/min (ref >60)
GFR calc non Af Amer: 56 mL/min — ABNORMAL LOW (ref >60)
Glucose, Bld: 136 mg/dL — ABNORMAL HIGH (ref 70–99)
Potassium: 3.8 mmol/L (ref 3.5–5.1)
Sodium: 138 mmol/L (ref 135–145)
Total Bilirubin: 0.8 mg/dL (ref 0.3–1.2)
Total Protein: 7.9 g/dL (ref 6.5–8.1)

## 2018-07-24 LAB — LIPASE, BLOOD: Lipase: 26 U/L (ref 11–51)

## 2018-07-24 MED ORDER — ONDANSETRON 4 MG PO TBDP: 4.0000 mg | ORAL_TABLET | Freq: Three times a day (TID) | ORAL | 0 refills | Status: DC | PRN

## 2018-07-24 MED ORDER — SODIUM CHLORIDE 0.9 % IV SOLN
1000.0000 mL | Freq: Once | INTRAVENOUS | Status: AC
  Administered 2018-07-24: 1000 mL via INTRAVENOUS

## 2018-07-24 MED ORDER — KETOROLAC TROMETHAMINE 30 MG/ML IJ SOLN
30.0000 mg | Freq: Once | INTRAMUSCULAR | Status: AC
  Administered 2018-07-24: 30 mg via INTRAVENOUS

## 2018-07-24 MED ORDER — MORPHINE SULFATE (PF) 4 MG/ML IV SOLN
4.0000 mg | Freq: Once | INTRAVENOUS | Status: AC
  Administered 2018-07-24: 4 mg via INTRAVENOUS
  Filled 2018-07-24: qty 1

## 2018-07-24 MED ORDER — NAPROXEN 500 MG PO TABS: 500.0000 mg | ORAL_TABLET | Freq: Two times a day (BID) | ORAL | 2 refills | Status: DC

## 2018-07-24 MED ORDER — TAMSULOSIN HCL 0.4 MG PO CAPS: 0.4000 mg | ORAL_CAPSULE | Freq: Every day | ORAL | 0 refills | Status: DC

## 2018-07-24 MED ORDER — ONDANSETRON HCL 4 MG/2ML IJ SOLN
4.0000 mg | Freq: Once | INTRAMUSCULAR | Status: AC
  Administered 2018-07-24: 4 mg via INTRAVENOUS

## 2018-07-24 MED ORDER — OXYCODONE-ACETAMINOPHEN 5-325 MG PO TABS: 1.0000 | ORAL_TABLET | ORAL | 0 refills | Status: DC | PRN

## 2018-07-24 NOTE — Telephone Encounter (Signed)
App made patient is aware and will be available for video app  Cameron [name]

## 2018-07-24 NOTE — ED Triage Notes (Signed)
Presents with wright flank pain which started about 2 am    Pos n/v

## 2018-07-24 NOTE — Telephone Encounter (Signed)
-----   Message from Abbie Sons, MD sent at 07/24/2018  4:45 PM EDT ----- Regarding: Virtual visit Patient was seen in the ER today with a stone.  Please schedule virtual visit with me tomorrow.

## 2018-07-24 NOTE — ED Provider Notes (Signed)
[facility] Emergency Department Provider Note   ____________________________________________    I have reviewed the triage vital signs and the nursing notes.   HISTORY  Chief Complaint Flank Pain     HPI Cameron [name] is a 63 y.o. male who presents with complaints of right flank pain which patient noticed yesterday but became acutely worse at around 2 AM.  He describes nausea and vomiting secondary to the severe pain which radiates to his groin on the right side.  He reports he has had this once 15 years ago related to a kidney stone but not since then.  Denies fevers or chills.  No dysuria.  Has not taken anything for this  Past Medical History:  Diagnosis Date  . Abnormal MRI 2007   nonspecific white matter abnormalities  . Allergy   . GERD (gastroesophageal reflux disease)   . Hyperlipidemia     Patient Active Problem List   Diagnosis Date Noted  . Anxiety 12/22/2017  . Low back pain 05/29/2014  . Routine general medical examination at a health care facility 02/23/2012  . GERD (gastroesophageal reflux disease)   . Hyperlipemia 10/03/2007  . Seasonal allergic rhinitis due to pollen 07/19/2006  . RENAL CALCULUS 07/19/2006    Past Surgical History:  Procedure Laterality Date  . URETEROLITHOTOMY  11/2001  . US ECHOCARDIOGRAPHY  11/2004   normal, Carotids normal    Prior to Admission medications   Medication Sig Start Date End Date Taking? Authorizing Provider  busPIRone (BUSPAR) 10 MG tablet TAKE 1 TABLET BY MOUTH TWICE A DAY 05/15/18  Yes Venia Carbon, MD  esomeprazole (NEXIUM) 20 MG capsule Take 20 mg by mouth daily at 12 noon.   Yes [provider]  ibuprofen (ADVIL,MOTRIN) 200 MG tablet Take 200-400 mg by mouth every 6 (six) hours as needed.   Yes [provider]  ANUCORT-HC 25 MG suppository UNWRAP AND INSERT 1 SUPPOSITORY INTO THE RECTUM 3 TIMES DAILY AS NEEDED 11/03/17   Venia Carbon, MD  fluticasone  (FLONASE) 50 MCG/ACT nasal spray Place 2 sprays into both nostrils daily. In each nostril 11/18/15   Viviana Simpler I, MD  hydrocortisone 2.5 % cream Apply topically 3 (three) times daily as needed. Patient not taking: Reported on 07/24/2018 04/23/18   Venia Carbon, MD  ibuprofen (ADVIL,MOTRIN) 600 MG tablet Take 1 tablet (600 mg total) by mouth every 8 (eight) hours as needed. Patient not taking: Reported on 07/24/2018 06/02/16   Sable Feil, PA-C  ketoconazole (NIZORAL) 2 % cream Apply 1 application topically 2 (two) times daily as needed for irritation. Patient not taking: Reported on 07/24/2018 04/23/18   Venia Carbon, MD  naproxen (NAPROSYN) 500 MG tablet Take 1 tablet (500 mg total) by mouth 2 (two) times daily with a meal. 07/24/18   Lavonia Drafts, MD  ondansetron (ZOFRAN ODT) 4 MG disintegrating tablet Take 1 tablet (4 mg total) by mouth every 8 (eight) hours as needed. 07/24/18   Lavonia Drafts, MD  oxyCODONE-acetaminophen (PERCOCET) 5-325 MG tablet Take 1 tablet by mouth every 4 (four) hours as needed for severe pain. 07/24/18 07/24/19  Lavonia Drafts, MD  tamsulosin (FLOMAX) 0.4 MG CAPS capsule Take 1 capsule (0.4 mg total) by mouth daily. 07/24/18   Lavonia Drafts, MD     Allergies Citalopram hydrobromide and Penicillins  Family History  Problem Relation Age of Onset  . Diabetes Father   . Heart disease Father   . Dementia Father   .  Dementia Paternal Grandmother   . CAD Neg Hx   . Hypertension Neg Hx   . Cancer Neg Hx     Social History Social History   Tobacco Use  . Smoking status: Never Smoker  . Smokeless tobacco: Never Used  Substance Use Topics  . Alcohol use: Yes    Alcohol/week: 0.0 standard drinks    Comment: rare  . Drug use: No    Review of Systems  Constitutional: No fever/chills Eyes: No visual changes.  ENT: No sore throat. Cardiovascular: Denies chest pain. Respiratory: Denies shortness of breath. Gastrointestinal: As above Genitourinary: As above  Musculoskeletal: Negative for back pain. Skin: Negative for rash. Neurological: Negative for headaches or weakness   ____________________________________________   PHYSICAL EXAM:  VITAL SIGNS: ED Triage Vitals  Enc Vitals Group     BP 07/24/18 0711 136/65     Pulse Rate 07/24/18 0711 77     Resp 07/24/18 0711 20     Temp 07/24/18 0711 (!) 97.5 F (36.4 C)     Temp Source 07/24/18 0711 Oral     SpO2 07/24/18 0711 99 %     Weight 07/24/18 0710 81.6 kg (180 lb)     Height 07/24/18 0710 1.803 m (5\' 11" )     Head Circumference --      Peak Flow --      Pain Score 07/24/18 0709 8     Pain Loc --      Pain Edu? --      Excl. in [place]? --     Constitutional: Alert and oriented.  Obvious pain Eyes: Conjunctivae are normal.   Nose: No congestion/rhinnorhea. Mouth/Throat: Mucous membranes are moist.    Cardiovascular: Normal rate, regular rhythm.   Good peripheral circulation. Respiratory: Normal respiratory effort.  No retractions. Lungs CTAB. Gastrointestinal: Soft and nontender. No distention.  No CVA tenderness.  Musculoskeletal: No lower extremity tenderness nor edema.  Warm and well perfused Neurologic:  Normal speech and language. No gross focal neurologic deficits are appreciated.  Skin:  Skin is warm, dry and intact. No rash noted. Psychiatric: Mood and affect are normal. Speech and behavior are normal.  ____________________________________________   LABS (all labs ordered are listed, but only abnormal results are displayed)  Labs Reviewed  CBC - Abnormal; Notable for the following components:      Result Value   WBC 11.4 (*)    All other components within normal limits  COMPREHENSIVE METABOLIC PANEL - Abnormal; Notable for the following components:   Glucose, Bld 136 (*)    Creatinine, Ser 1.36 (*)    GFR calc non Af Amer 56 (*)    All other components within normal limits  URINALYSIS, COMPLETE (UACMP) WITH MICROSCOPIC - Abnormal; Notable for the following  components:   Color, Urine YELLOW (*)    APPearance HAZY (*)    All other components within normal limits  LIPASE, BLOOD   ____________________________________________  EKG  None ____________________________________________  RADIOLOGY  CT renal stone study ____________________________________________   PROCEDURES  Procedure(s) performed: No  Procedures   Critical Care performed: No ____________________________________________   INITIAL IMPRESSION / ASSESSMENT AND PLAN / ED COURSE  Pertinent labs & imaging results that were available during my care of the patient were reviewed by me and considered in my medical decision making (see chart for details).  Patient presents with right flank pain with radiation to the groin with relatively abrupt onset highly suspicious for ureterolithiasis, also on the differential is pancreatitis, pyelonephritis.  Will give IV Toradol, IV Zofran, IV fluids PRN morphine and obtain CT renal stone study as well as labs and urinalysis  CT scan confirms 5 x 7 mm stone distal right ureter  Patient required second dose of morphine IV which seems to be controlling his pain.  Discussed with Dr. Bernardo Heater of urology, given that pain seems to be well controlled he will follow-up the patient as an outpatient, discussed this plan with patient who agrees    ____________________________________________   FINAL CLINICAL IMPRESSION(S) / ED DIAGNOSES  Final diagnoses:  Ureterolithiasis        Note:  This document was prepared using Dragon voice recognition software and may include unintentional dictation errors.   Lavonia Drafts, MD 07/24/18 1212

## 2018-07-24 NOTE — ED Notes (Signed)
Electronic signature pad not working; paper copy signed

## 2018-07-24 NOTE — Telephone Encounter (Signed)
He does have an obstructing kidney stone. Will await full ER disposition

## 2018-07-24 NOTE — Telephone Encounter (Signed)
Appointment request sent through [place]. Patient reported "kidney stone, throwing up, pain at an 8-9. Emergency."

## 2018-07-24 NOTE — Telephone Encounter (Signed)
Per EMR, patient arrived at [facility] ER 07/24/18 @ 0707 and is presently being evaluated. Forwarded to PCP.

## 2018-07-25 ENCOUNTER — Encounter: Payer: Self-pay | Admitting: Urology

## 2018-07-25 ENCOUNTER — Ambulatory Visit (INDEPENDENT_AMBULATORY_CARE_PROVIDER_SITE_OTHER): Payer: BC Managed Care – PPO | Admitting: Urology

## 2018-07-25 DIAGNOSIS — N132 Hydronephrosis with renal and ureteral calculous obstruction: Secondary | ICD-10-CM | POA: Diagnosis not present

## 2018-07-25 DIAGNOSIS — N201 Calculus of ureter: Secondary | ICD-10-CM | POA: Diagnosis not present

## 2018-07-25 DIAGNOSIS — N23 Unspecified renal colic: Secondary | ICD-10-CM | POA: Diagnosis not present

## 2018-07-25 NOTE — Progress Notes (Signed)
Virtual Visit via Video Note  I connected with Cameron [name] on 07/25/18 at  2:00 PM EDT by a video enabled telemedicine application and verified that I am speaking with the correct person using two identifiers.   I discussed the limitations of evaluation and management by telemedicine and the availability of in person appointments. The patient expressed understanding and agreed to proceed.  History of Present Illness: Cameron [name] is a 63 year old male who presented to the [facility] ED yesterday morning with a 1 day history of right flank pain.  It became acutely worse 5 hours prior to his ED visit.  The intensity was severe and radiated to the right groin region.  Denies identifiable precipitating, aggravating factors.  A stone protocol CT of the abdomen pelvis was performed showed a 5 x 7 mm right distal ureteral calculus with moderate hydronephrosis/hydroureter.  His pain improved with parenteral analgesics.  He had associated nausea and vomiting but denied fever or chills.  Past urologic history markable for a ureteral calculus approximately 15 years ago.  He underwent shockwave lithotripsy which was unsuccessful and subsequently had ureteroscopic removal.  Due to the stone size and degree of pain he requested stone removal as soon as possible.  He is tentatively scheduled for ureteroscopic stone removal on 07/27/2018.  Since his ED visit his pain has significantly improved.  He has mild pelvic discomfort.  Denies bothersome lower urinary tract symptoms or gross hematuria.  Urinalysis in the ED remarkable for microhematuria only.   Observations/Objective: Cameron [name] was alert and in no acute distress  Assessment and Plan: 63 year old male with a 5 x 7 mm right ureteral calculus.  He is tentatively scheduled for ureteroscopic stone removal 07/27/2018.  He was instructed to contact the office or same-day surgery should he pass the stone prior to that time.  Management options were  discussed including ureteroscopic removal and shockwave lithotripsy.  He was informed based on stone location ureteroscopy is more invasive but has a much higher success rate.  He has had previous unsuccessful shockwave lithotripsy and desires ureteroscopy.  The indications and nature of the planned procedure were discussed as well as the potential  benefits and expected outcome.  Alternatives have been discussed in detail. The most common complications and side effects were discussed including but not limited to infection/sepsis; blood loss; damage to urethra, bladder, ureter; need for prolonged stent placement as well as general anesthesia risks. Although uncommon he was also informed of the possibility that the calculus may not be able to be treated due to inability to obtain access to the ureter. In that event he would require stent placement and a follow-up procedure after a period of stent dilation. All of his questions were answered an he desires to proceed.    Follow Up Instructions: Scheduled for ureteroscopic removal 07/27/2018   I discussed the assessment and treatment plan with the patient. The patient was provided an opportunity to ask questions and all were answered. The patient agreed with the plan and demonstrated an understanding of the instructions.   The patient was advised to call back or seek an in-person evaluation if the symptoms worsen or if the condition fails to improve as anticipated.  I provided 20 minutes of non-face-to-face time during this encounter.   Abbie Sons, MD

## 2018-07-25 NOTE — H&P (View-Only) (Signed)
Virtual Visit via Video Note  I connected with Cameron [name] on 07/25/18 at  2:00 PM EDT by a video enabled telemedicine application and verified that I am speaking with the correct person using two identifiers.   I discussed the limitations of evaluation and management by telemedicine and the availability of in person appointments. The patient expressed understanding and agreed to proceed.  History of Present Illness: Cameron [name] is a 63 year old male who presented to the [facility] ED yesterday morning with a 1 day history of right flank pain.  It became acutely worse 5 hours prior to his ED visit.  The intensity was severe and radiated to the right groin region.  Denies identifiable precipitating, aggravating factors.  A stone protocol CT of the abdomen pelvis was performed showed a 5 x 7 mm right distal ureteral calculus with moderate hydronephrosis/hydroureter.  His pain improved with parenteral analgesics.  He had associated nausea and vomiting but denied fever or chills.  Past urologic history markable for a ureteral calculus approximately 15 years ago.  He underwent shockwave lithotripsy which was unsuccessful and subsequently had ureteroscopic removal.  Due to the stone size and degree of pain he requested stone removal as soon as possible.  He is tentatively scheduled for ureteroscopic stone removal on 07/27/2018.  Since his ED visit his pain has significantly improved.  He has mild pelvic discomfort.  Denies bothersome lower urinary tract symptoms or gross hematuria.  Urinalysis in the ED remarkable for microhematuria only.   Observations/Objective: [name] was alert and in no acute distress  Assessment and Plan: 63 year old male with a 5 x 7 mm right ureteral calculus.  He is tentatively scheduled for ureteroscopic stone removal 07/27/2018.  He was instructed to contact the office or same-day surgery should he pass the stone prior to that time.  Management options were  discussed including ureteroscopic removal and shockwave lithotripsy.  He was informed based on stone location ureteroscopy is more invasive but has a much higher success rate.  He has had previous unsuccessful shockwave lithotripsy and desires ureteroscopy.  The indications and nature of the planned procedure were discussed as well as the potential  benefits and expected outcome.  Alternatives have been discussed in detail. The most common complications and side effects were discussed including but not limited to infection/sepsis; blood loss; damage to urethra, bladder, ureter; need for prolonged stent placement as well as general anesthesia risks. Although uncommon he was also informed of the possibility that the calculus may not be able to be treated due to inability to obtain access to the ureter. In that event he would require stent placement and a follow-up procedure after a period of stent dilation. All of his questions were answered an he desires to proceed.    Follow Up Instructions: Scheduled for ureteroscopic removal 07/27/2018   I discussed the assessment and treatment plan with the patient. The patient was provided an opportunity to ask questions and all were answered. The patient agreed with the plan and demonstrated an understanding of the instructions.   The patient was advised to call back or seek an in-person evaluation if the symptoms worsen or if the condition fails to improve as anticipated.  I provided 20 minutes of non-face-to-face time during this encounter.   Abbie Sons, MD

## 2018-07-26 ENCOUNTER — Other Ambulatory Visit: Payer: Self-pay

## 2018-07-26 ENCOUNTER — Encounter
Admission: RE | Admit: 2018-07-26 | Discharge: 2018-07-26 | Disposition: A | Discharge status: Home / Self Care | Payer: BC Managed Care – PPO | Source: Ambulatory Visit | Attending: Urology | Admitting: Urology

## 2018-07-26 HISTORY — DX: Anxiety disorder, unspecified: F41.9

## 2018-07-26 HISTORY — DX: Migraine, unspecified, not intractable, without status migrainosus: G43.909

## 2018-07-26 HISTORY — DX: Personal history of urinary calculi: Z87.442

## 2018-07-26 MED ORDER — CEFAZOLIN SODIUM-DEXTROSE 2-4 GM/100ML-% IV SOLN
2.0000 g | Freq: Once | INTRAVENOUS | Status: AC
  Administered 2018-07-27: 2 g via INTRAVENOUS

## 2018-07-26 NOTE — Patient Instructions (Signed)
Your procedure is scheduled on: 07-27-18 Report to Same Day Surgery 2nd floor medical mall [facility] Entrance-take elevator on left to 2nd floor.  Check in with surgery information desk.) To find out your arrival time please call [phone] between 1PM - 3PM on 07-26-18  Remember: Instructions that are not followed completely may result in serious medical risk, up to and including death, or upon the discretion of your surgeon and anesthesiologist your surgery may need to be rescheduled.    _x___ 1. Do not eat food after midnight the night before your procedure. You may drink clear liquids up to 2 hours before you are scheduled to arrive at the hospital for your procedure.  Do not drink clear liquids within 2 hours of your scheduled arrival to the hospital.  Clear liquids include  --Water or Apple juice without pulp  --Clear carbohydrate beverage such as ClearFast or Gatorade  --Black Coffee or Clear Tea (No milk, no creamers, do not add anything to the coffee or Tea   ____Ensure clear carbohydrate drink on the way to the hospital for bariatric patients  ____Ensure clear carbohydrate drink 3 hours before surgery for Dr Dwyane Luo patients if physician instructed.   No gum chewing or hard candies.     __x__ 2. No Alcohol for 24 hours before or after surgery.   __x__3. No Smoking or e-cigarettes for 24 prior to surgery.  Do not use any chewable tobacco products for at least 6 hour prior to surgery   ____  4. Bring all medications with you on the day of surgery if instructed.    __x__ 5. Notify your doctor if there is any change in your medical condition     (cold, fever, infections).    x___6. On the morning of surgery brush your teeth with toothpaste and water.  You may rinse your mouth with mouth wash if you wish.  Do not swallow any toothpaste or mouthwash.   Do not wear jewelry, make-up, hairpins, clips or nail polish.  Do not wear lotions, powders, or perfumes. You may wear  deodorant.  Do not shave 48 hours prior to surgery. Men may shave face and neck.  Do not bring valuables to the hospital.    [facility] is not responsible for any belongings or valuables.               Contacts, dentures or bridgework may not be worn into surgery.  Leave your suitcase in the car. After surgery it may be brought to your room.  For patients admitted to the hospital, discharge time is determined by your treatment team.  _  Patients discharged the day of surgery will not be allowed to drive home.  You will need someone to drive you home and stay with you the night of your procedure.    Please read over the following fact sheets that you were given:   [facility] Preparing for Surgery  _x___ TAKE THE FOLLOWING MEDICATION THE MORNING OF SURGERY WITH A SMALL SIP OF WATER. These include:  1. BUSPAR  2. [place]  3. TAKE A NEXIUM TONIGHT BEFORE BED  4. YOU MAY TAKE PERCOCET DOS IF NEEDED  5.  6.  ____Fleets enema or Magnesium Citrate as directed.   ____ Use CHG Soap or sage wipes as directed on instruction sheet   ____ Use inhalers on the day of surgery and bring to hospital day of surgery  ____ Stop Metformin and Janumet 2 days prior to surgery.  ____ Take 1/2 of usual insulin dose the night before surgery and none on the morning surgery.   ____ Follow recommendations from Cardiologist, Pulmonologist or PCP regarding stopping Aspirin, Coumadin, Plavix ,Eliquis, Effient, or Pradaxa, and Pletal.  X____Stop Anti-inflammatories such as Advil, Aleve, Ibuprofen, Motrin, Naproxen, Naprosyn, Goodies powders or aspirin products NOW-OK to take Tylenol OR PERCOCET IF NEEDED   ____ Stop supplements until after surgery.    ____ Bring C-Pap to the hospital.

## 2018-07-27 ENCOUNTER — Ambulatory Visit
Admission: RE | Admit: 2018-07-27 | Discharge: 2018-07-27 | Disposition: A | Discharge status: Home / Self Care | Payer: BC Managed Care – PPO | Attending: Urology | Admitting: Urology

## 2018-07-27 ENCOUNTER — Ambulatory Visit: Payer: BC Managed Care – PPO | Admitting: Anesthesiology

## 2018-07-27 ENCOUNTER — Other Ambulatory Visit: Payer: Self-pay

## 2018-07-27 ENCOUNTER — Encounter: Payer: Self-pay | Admitting: *Deleted

## 2018-07-27 ENCOUNTER — Encounter
Admission: RE | Disposition: A | Discharge status: Home / Self Care | Payer: Self-pay | Source: Home / Self Care | Attending: Urology

## 2018-07-27 DIAGNOSIS — N201 Calculus of ureter: Secondary | ICD-10-CM

## 2018-07-27 DIAGNOSIS — N132 Hydronephrosis with renal and ureteral calculous obstruction: Secondary | ICD-10-CM | POA: Diagnosis not present

## 2018-07-27 HISTORY — PX: CYSTOSCOPY/URETEROSCOPY/HOLMIUM LASER/STENT PLACEMENT: SHX6546

## 2018-07-27 SURGERY — CYSTOSCOPY/URETEROSCOPY/HOLMIUM LASER/STENT PLACEMENT
Anesthesia: General | Laterality: Right

## 2018-07-27 MED ORDER — OXYBUTYNIN CHLORIDE 5 MG PO TABS: ORAL_TABLET | ORAL | 0 refills | Status: DC

## 2018-07-27 MED ORDER — ACETAMINOPHEN 10 MG/ML IV SOLN
INTRAVENOUS | Status: AC
  Filled 2018-07-27: qty 100

## 2018-07-27 MED ORDER — LIDOCAINE HCL (CARDIAC) PF 100 MG/5ML IV SOSY
PREFILLED_SYRINGE | INTRAVENOUS | Status: DC | PRN
  Administered 2018-07-27: 60 mg via INTRAVENOUS

## 2018-07-27 MED ORDER — LACTATED RINGERS IV SOLN
INTRAVENOUS | Status: DC
  Administered 2018-07-27: 09:00:00 via INTRAVENOUS

## 2018-07-27 MED ORDER — PROPOFOL 10 MG/ML IV BOLUS
INTRAVENOUS | Status: DC | PRN
  Administered 2018-07-27: 150 mg via INTRAVENOUS

## 2018-07-27 MED ORDER — ONDANSETRON HCL 4 MG/2ML IJ SOLN
INTRAMUSCULAR | Status: DC | PRN
  Administered 2018-07-27: 4 mg via INTRAVENOUS

## 2018-07-27 MED ORDER — FENTANYL CITRATE (PF) 100 MCG/2ML IJ SOLN
INTRAMUSCULAR | Status: AC
  Filled 2018-07-27: qty 2

## 2018-07-27 MED ORDER — OXYCODONE HCL 5 MG PO TABS
5.0000 mg | ORAL_TABLET | Freq: Once | ORAL | Status: AC | PRN
  Administered 2018-07-27: 11:00:00 5 mg via ORAL

## 2018-07-27 MED ORDER — IOPAMIDOL (ISOVUE-200) INJECTION 41%
INTRAVENOUS | Status: DC | PRN
  Administered 2018-07-27: 10 mL

## 2018-07-27 MED ORDER — MIDAZOLAM HCL 2 MG/2ML IJ SOLN
INTRAMUSCULAR | Status: AC
  Filled 2018-07-27: qty 2

## 2018-07-27 MED ORDER — FENTANYL CITRATE (PF) 100 MCG/2ML IJ SOLN: 25.0000 ug | INTRAMUSCULAR | Status: DC | PRN

## 2018-07-27 MED ORDER — MEPERIDINE HCL 50 MG/ML IJ SOLN: 6.2500 mg | INTRAMUSCULAR | Status: DC | PRN

## 2018-07-27 MED ORDER — PHENYLEPHRINE HCL (PRESSORS) 10 MG/ML IV SOLN
INTRAVENOUS | Status: DC | PRN
  Administered 2018-07-27: 50 ug via INTRAVENOUS

## 2018-07-27 MED ORDER — DEXAMETHASONE SODIUM PHOSPHATE 10 MG/ML IJ SOLN
INTRAMUSCULAR | Status: AC
  Filled 2018-07-27: qty 1

## 2018-07-27 MED ORDER — DEXAMETHASONE SODIUM PHOSPHATE 10 MG/ML IJ SOLN
INTRAMUSCULAR | Status: DC | PRN
  Administered 2018-07-27: 10 mg via INTRAVENOUS

## 2018-07-27 MED ORDER — LIDOCAINE HCL (PF) 2 % IJ SOLN
INTRAMUSCULAR | Status: AC
  Filled 2018-07-27: qty 10

## 2018-07-27 MED ORDER — PROMETHAZINE HCL 25 MG/ML IJ SOLN: 6.2500 mg | INTRAMUSCULAR | Status: DC | PRN

## 2018-07-27 MED ORDER — FENTANYL CITRATE (PF) 100 MCG/2ML IJ SOLN
INTRAMUSCULAR | Status: DC | PRN
  Administered 2018-07-27: 25 ug via INTRAVENOUS

## 2018-07-27 MED ORDER — OXYCODONE HCL 5 MG/5ML PO SOLN: 5.0000 mg | Freq: Once | ORAL | Status: AC | PRN

## 2018-07-27 MED ORDER — MIDAZOLAM HCL 2 MG/2ML IJ SOLN
INTRAMUSCULAR | Status: DC | PRN
  Administered 2018-07-27: 2 mg via INTRAVENOUS

## 2018-07-27 MED ORDER — ONDANSETRON HCL 4 MG/2ML IJ SOLN
INTRAMUSCULAR | Status: AC
  Filled 2018-07-27: qty 2

## 2018-07-27 MED ORDER — ACETAMINOPHEN 10 MG/ML IV SOLN
INTRAVENOUS | Status: DC | PRN
  Administered 2018-07-27: 1000 mg via INTRAVENOUS

## 2018-07-27 MED ORDER — PROPOFOL 10 MG/ML IV BOLUS
INTRAVENOUS | Status: AC
  Filled 2018-07-27: qty 20

## 2018-07-27 MED ORDER — OXYCODONE HCL 5 MG PO TABS
ORAL_TABLET | ORAL | Status: AC
  Filled 2018-07-27: qty 1

## 2018-07-27 MED ORDER — CEFAZOLIN SODIUM-DEXTROSE 2-4 GM/100ML-% IV SOLN
INTRAVENOUS | Status: AC
  Filled 2018-07-27: qty 100

## 2018-07-27 SURGICAL SUPPLY — 30 items
BAG DRAIN CYSTO-URO LG1000N (MISCELLANEOUS) ×2 IMPLANT
BASKET ZERO TIP 1.9FR (BASKET) IMPLANT
BRUSH SCRUB EZ 1% IODOPHOR (MISCELLANEOUS) ×2 IMPLANT
BSKT STON RTRVL ZERO TP 1.9FR (BASKET)
CATH URETL 5X70 OPEN END (CATHETERS) IMPLANT
CNTNR SPEC 2.5X3XGRAD LEK (MISCELLANEOUS)
CONT SPEC 4OZ STER OR WHT (MISCELLANEOUS)
CONT SPEC 4OZ STRL OR WHT (MISCELLANEOUS)
CONTAINER SPEC 2.5X3XGRAD LEK (MISCELLANEOUS) IMPLANT
DRAPE UTILITY 15X26 TOWEL STRL (DRAPES) ×2 IMPLANT
FIBER LASER LITHO 273 (Laser) IMPLANT
GLOVE BIO SURGEON STRL SZ8 (GLOVE) ×2 IMPLANT
GOWN STRL REUS W/ TWL LRG LVL3 (GOWN DISPOSABLE) ×1 IMPLANT
GOWN STRL REUS W/ TWL XL LVL3 (GOWN DISPOSABLE) ×1 IMPLANT
GOWN STRL REUS W/TWL LRG LVL3 (GOWN DISPOSABLE) ×2
GOWN STRL REUS W/TWL XL LVL3 (GOWN DISPOSABLE) ×2
GUIDEWIRE STR DUAL SENSOR (WIRE) ×3 IMPLANT
INFUSOR MANOMETER BAG 3000ML (MISCELLANEOUS) ×2 IMPLANT
INTRODUCER DILATOR DOUBLE (INTRODUCER) IMPLANT
KIT TURNOVER CYSTO (KITS) ×2 IMPLANT
PACK CYSTO AR (MISCELLANEOUS) ×2 IMPLANT
SET CYSTO W/LG BORE CLAMP LF (SET/KITS/TRAYS/PACK) ×2 IMPLANT
SHEATH URETERAL 12FRX35CM (MISCELLANEOUS) IMPLANT
SOL .9 NS 3000ML IRR  AL (IV SOLUTION) ×1
SOL .9 NS 3000ML IRR AL (IV SOLUTION) ×1
SOL .9 NS 3000ML IRR UROMATIC (IV SOLUTION) ×1 IMPLANT
STENT URET 6FRX24 CONTOUR (STENTS) ×1 IMPLANT
STENT URET 6FRX26 CONTOUR (STENTS) IMPLANT
SURGILUBE 2OZ TUBE FLIPTOP (MISCELLANEOUS) ×2 IMPLANT
WATER STERILE IRR 1000ML POUR (IV SOLUTION) ×2 IMPLANT

## 2018-07-27 NOTE — Anesthesia Postprocedure Evaluation (Signed)
Anesthesia Post Note  Patient: Cameron [name]  Procedure(s) Performed: CYSTOSCOPY/URETEROSCOPY/HOLMIUM LASER/STENT PLACEMENT (Right )  Patient location during evaluation: PACU Anesthesia Type: General Level of consciousness: awake and alert and oriented Pain management: pain level controlled Vital Signs Assessment: post-procedure vital signs reviewed and stable Respiratory status: spontaneous breathing, nonlabored ventilation and respiratory function stable Cardiovascular status: blood pressure returned to baseline and stable Postop Assessment: no signs of nausea or vomiting Anesthetic complications: no     Last Vitals:  Vitals:   07/27/18 1116 07/27/18 1135  BP: 116/81 107/74  Pulse: 61 68  Resp: 16 16  Temp: (!) 36.1 C (!) 36.1 C  SpO2: 93% 100%    Last Pain:  Vitals:   07/27/18 1135  TempSrc: Temporal  PainSc: 3                  Treniece Holsclaw

## 2018-07-27 NOTE — Op Note (Signed)
Preoperative diagnosis: Right ureteral calculus  Postoperative diagnosis: Right ureteral calculus  Procedure:  1. Cystoscopy 2. Right ureteroscopy and stone removal 3. Ureteroscopic laser lithotripsy 4. Right ureteral stent placement (6FR) 24 cm 5. Right retrograde pyelography with interpretation  Surgeon: Nicki Reaper C. Johncharles Fusselman, M.D.  Anesthesia: General  Complications: None  Intraoperative findings:  1.  Right retrograde pyelography post procedure showed no filling defects, stone fragments or contrast extravasation  EBL: Minimal  Specimens: 1. None   Indication: Cameron [name] is a 63 y.o. year old patient with a 7 mm left distal ureteral calculus with hydronephrosis. After reviewing the management options for treatment, the patient elected to proceed with the above surgical procedure(s). We have discussed the potential benefits and risks of the procedure, side effects of the proposed treatment, the likelihood of the patient achieving the goals of the procedure, and any potential problems that might occur during the procedure or recuperation. Informed consent has been obtained.  Description of procedure:  The patient was taken to the operating room and general anesthesia was induced.  The patient was placed in the dorsal lithotomy position, prepped and draped in the usual sterile fashion, and preoperative antibiotics were administered. A preoperative time-out was performed.   A 21 French cystoscope was lubricated and passed under direct vision.  The urethra was normal in caliber without stricture.  The prostate demonstrated moderate lateral lobe enlargement.  Panendoscopy was performed and the bladder mucosa showed no erythema, solid or papillary lesions.  Attention was directed to the right ureteral orifice and a 0.038 Sensor wire was then advanced up the ureter into the renal pelvis under fluoroscopic guidance.  A 4.5 Fr semirigid ureteroscope was then advanced into the ureter  next to the guidewire and the calculus was identified.  The calculus was then dusted/fragmented with a 273 micron holmium laser fiber on a setting of 0.2 J and frequency of 20 hz.   All stone fragments were then removed from the ureter with a zero tip nitinol basket and dropped in the bladder.  Reinspection of the ureter revealed no remaining visible stones or fragments.   Retrograde pyelogram was performed with findings as described above.  The wire was then backloaded through the cystoscope and a 6 French/24 cm double-J ureteral stent was advance over the wire using Seldinger technique.  The bladder was then emptied and the procedure ended.  The patient appeared to tolerate the procedure well and without complications.  After anesthetic reversal the patient was transported to the PACU in stable condition.    Plan: There was no significant inflammatory changes of the ureter and the procedure progressed atraumatically.  The stent was left attached to a tether and the patient will remove his stent on Monday, 07/30/2018.   Cameron Giovanni, MD

## 2018-07-27 NOTE — Discharge Instructions (Signed)
AMBULATORY SURGERY  DISCHARGE INSTRUCTIONS   1) The drugs that you were given will stay in your system until tomorrow so for the next 24 hours you should not:  A) Drive an automobile B) Make any legal decisions C) Drink any alcoholic beverage   2) You may resume regular meals tomorrow.  Today it is better to start with liquids and gradually work up to solid foods.  You may eat anything you prefer, but it is better to start with liquids, then soup and crackers, and gradually work up to solid foods.   3) Please notify your doctor immediately if you have any unusual bleeding, trouble breathing, redness and pain at the surgery site, drainage, fever, or pain not relieved by medication.    4) Additional Instructions:        Please contact your physician with any problems or Same Day Surgery at [phone], Monday through Friday 6 am to 4 pm, or [place] at [facility] number at [phone].  DISCHARGE INSTRUCTIONS FOR KIDNEY STONE/URETERAL STENT   MEDICATIONS:  1. Resume all your other meds from home.  2.  AZO (over-the-counter) can help with the burning/stinging when you urinate. 3.  You may take oxycodone for moderate/severe pain 4.  Rx oxybutynin was sent to your pharmacy.  This is for bladder irritation due to the procedure/stent  ACTIVITY:  1. May resume regular activities in 24 hours. 2. No driving while on narcotic pain medications  3. Drink plenty of water  4. Continue to walk at home - you can still get blood clots when you are at home, so keep active, but don't over do it.  5. May return to work/school tomorrow or when you feel ready   BATHING:  1. You can shower. 2. You have a string coming from your urethra: The stent string is attached to your ureteral stent. Do not pull on this.   SIGNS/SYMPTOMS TO CALL:  Please call us if you have a fever greater than 101.5, uncontrolled nausea/vomiting, uncontrolled pain, dizziness, unable to urinate, excessively  bloody urine, chest pain, shortness of breath, leg swelling, leg pain, or any other concerns or questions.   Urinary frequency, urgency, bladder spasm will be common as well as intermittent blood in the urine  You can reach [place] at [phone].   FOLLOW-UP:  1. You will be contacted regarding a postop follow-up appointment 2. You have a string attached to your stent, you may remove it on Monday, 07/30/2018. To do this, pull the string until the stent is completely removed. You may feel an odd sensation in your back. 3.  You should pass some small fragments today.  You will be given a strainer and please save these fragments so they may be sent for analysis.

## 2018-07-27 NOTE — Interval H&P Note (Signed)
History and Physical Interval Note: The procedure was again reviewed and all questions were answered.  CV: RRR, lungs: Clear  07/27/2018 9:33 AM  Cameron [name]  has presented today for surgery, with the diagnosis of right obstructing distal ureteral calculus.  The various methods of treatment have been discussed with the patient and family. After consideration of risks, benefits and other options for treatment, the patient has consented to  Procedure(s): CYSTOSCOPY/URETEROSCOPY/HOLMIUM LASER/STENT PLACEMENT (Right) as a surgical intervention.  The patient's history has been reviewed, patient examined, no change in status, stable for surgery.  I have reviewed the patient's chart and labs.  Questions were answered to the patient's satisfaction.     [place]

## 2018-07-27 NOTE — Anesthesia Post-op Follow-up Note (Signed)
Anesthesia QCDR form completed.        

## 2018-07-27 NOTE — Transfer of Care (Signed)
Immediate Anesthesia Transfer of Care Note  Patient: Cameron [name]  Procedure(s) Performed: CYSTOSCOPY/URETEROSCOPY/HOLMIUM LASER/STENT PLACEMENT (Right )  Patient Location: PACU  Anesthesia Type:General  Level of Consciousness: drowsy and patient cooperative  Airway & Oxygen Therapy: Patient Spontanous Breathing and Patient connected to face mask oxygen  Post-op Assessment: Report given to RN and Post -op Vital signs reviewed and stable  Post vital signs: Reviewed and stable  Last Vitals:  Vitals Value Taken Time  BP 91/65 07/27/2018 10:35 AM  Temp    Pulse 67 07/27/2018 10:35 AM  Resp 13 07/27/2018 10:35 AM  SpO2 99 % 07/27/2018 10:35 AM  Vitals shown include unvalidated device data.  Last Pain:  Vitals:   07/27/18 0854  TempSrc: Temporal  PainSc: 1          Complications: No apparent anesthesia complications

## 2018-07-27 NOTE — Anesthesia Procedure Notes (Signed)
Procedure Name: LMA Insertion Date/Time: 07/27/2018 9:43 AM Performed by: Jonna Clark, CRNA Pre-anesthesia Checklist: Patient identified, Patient being monitored, Timeout performed, Emergency Drugs available and Suction available Patient Re-evaluated:Patient Re-evaluated prior to induction Oxygen Delivery Method: Circle system utilized Preoxygenation: Pre-oxygenation with 100% oxygen Induction Type: IV induction Ventilation: Mask ventilation without difficulty LMA: LMA inserted LMA Size: 4.0 Tube type: Oral Number of attempts: 1 Placement Confirmation: positive ETCO2 and breath sounds checked- equal and bilateral Tube secured with: Tape Dental Injury: Teeth and Oropharynx as per pre-operative assessment

## 2018-07-27 NOTE — Anesthesia Preprocedure Evaluation (Signed)
Anesthesia Evaluation  Patient identified by MRN, date of birth, ID band Patient awake    Reviewed: Allergy & Precautions, NPO status , Patient's Chart, lab work & pertinent test results  History of Anesthesia Complications Negative for: history of anesthetic complications  Airway Mallampati: II  TM Distance: >3 FB Neck ROM: Full    Dental no notable dental hx.    Pulmonary neg pulmonary ROS, neg sleep apnea, neg COPD,    breath sounds clear to auscultation- rhonchi (-) wheezing      Cardiovascular Exercise Tolerance: Good (-) hypertension(-) CAD, (-) Past MI, (-) Cardiac Stents and (-) CABG  Rhythm:Regular Rate:Normal - Systolic murmurs and - Diastolic murmurs    Neuro/Psych  Headaches, neg Seizures Anxiety    GI/Hepatic Neg liver ROS, GERD  ,  Endo/Other  negative endocrine ROSneg diabetes  Renal/GU Renal disease: nephrolithiasis.     Musculoskeletal negative musculoskeletal ROS (+)   Abdominal (+) - obese,   Peds  Hematology negative hematology ROS (+)   Anesthesia Other Findings Past Medical History: 2007: Abnormal MRI     Comment:  nonspecific white matter abnormalities No date: Allergy No date: Anxiety No date: GERD (gastroesophageal reflux disease) No date: History of kidney stones No date: Hyperlipidemia No date: Migraines     Comment:  OCULAR   Reproductive/Obstetrics                             Anesthesia Physical Anesthesia Plan  ASA: II  Anesthesia Plan: General   Post-op Pain Management:    Induction: Intravenous  PONV Risk Score and Plan: 1 and Ondansetron and Midazolam  Airway Management Planned: LMA  Additional Equipment:   Intra-op Plan:   Post-operative Plan:   Informed Consent: I have reviewed the patients History and Physical, chart, labs and discussed the procedure including the risks, benefits and alternatives for the proposed anesthesia with the  patient or authorized representative who has indicated his/her understanding and acceptance.     Dental advisory given  Plan Discussed with: CRNA and Anesthesiologist  Anesthesia Plan Comments:         Anesthesia Quick Evaluation

## 2018-07-30 ENCOUNTER — Encounter: Payer: Self-pay | Admitting: Urology

## 2018-07-31 ENCOUNTER — Telehealth: Payer: Self-pay | Admitting: Urology

## 2018-07-31 NOTE — Telephone Encounter (Signed)
Pt LMOM and would like a call back for a f/u from surgery on 07/27/2018. Please advise.

## 2018-07-31 NOTE — Telephone Encounter (Signed)
Called pt no answer. LM for pt informing him that he should have removed stent yesterday and that receptionist will be calling him to set up post op appt.

## 2018-09-04 ENCOUNTER — Ambulatory Visit (INDEPENDENT_AMBULATORY_CARE_PROVIDER_SITE_OTHER): Payer: BC Managed Care – PPO

## 2018-09-04 ENCOUNTER — Other Ambulatory Visit: Payer: Self-pay

## 2018-09-04 ENCOUNTER — Encounter: Payer: Self-pay | Admitting: Urology

## 2018-09-04 ENCOUNTER — Ambulatory Visit: Payer: BC Managed Care – PPO | Admitting: Urology

## 2018-09-04 VITALS — BP 137/82 | HR 70 | Ht 71.0 in | Wt 181.0 lb

## 2018-09-04 DIAGNOSIS — Z87442 Personal history of urinary calculi: Secondary | ICD-10-CM | POA: Diagnosis not present

## 2018-09-04 DIAGNOSIS — Z9889 Other specified postprocedural states: Secondary | ICD-10-CM

## 2018-09-04 DIAGNOSIS — Z23 Encounter for immunization: Secondary | ICD-10-CM

## 2018-09-04 NOTE — Progress Notes (Signed)
09/04/2018 10:33 AM   Fayrene Helper 05/01/1955 [id number]  Referring provider: Venia Carbon, MD [address] [place], [place] [address]  Chief Complaint  Patient presents with  . Follow-up    Postop    HPI: Cameron [name] is a 63 year old male who presents for postop follow-up.  He initially presented in early April with a 7 mm right distal ureteral calculus and renal colic.  He underwent ureteroscopic stone removal on 07/27/2018.  He removed his stent on 4/13 and had no problems.  He had 1 previous stone 15 years ago.  His CT did not show additional calculi.   PMH: Past Medical History:  Diagnosis Date  . Abnormal MRI 2007   nonspecific white matter abnormalities  . Allergy   . Anxiety   . GERD (gastroesophageal reflux disease)   . History of kidney stones   . Hyperlipidemia   . Migraines    OCULAR    Surgical History: Past Surgical History:  Procedure Laterality Date  . CYSTOSCOPY/URETEROSCOPY/HOLMIUM LASER/STENT PLACEMENT Right 07/27/2018   Procedure: CYSTOSCOPY/URETEROSCOPY/HOLMIUM LASER/STENT PLACEMENT;  Surgeon: Abbie Sons, MD;  Location: [facility] ORS;  Service: Urology;  Laterality: Right;  . URETEROLITHOTOMY  11/2001  . US ECHOCARDIOGRAPHY  11/2004   normal, Carotids normal    Home Medications:  Allergies as of 09/04/2018      Reactions   Citalopram Hydrobromide    REACTION: unspecified   Penicillins Other (See Comments)   REACTION: questionable REACTION: questionable      Medication List       Accurate as of Sep 04, 2018 10:33 AM. If you have any questions, ask your nurse or doctor.        STOP taking these medications   hydrocortisone 2.5 % cream Stopped by:  Abbie Sons, MD   ketoconazole 2 % cream Commonly known as:  NIZORAL Stopped by:  Abbie Sons, MD   ondansetron 4 MG disintegrating tablet Commonly known as:  Zofran ODT Stopped by:  Abbie Sons, MD   oxybutynin 5 MG tablet Commonly known as:  DITROPAN  Stopped by:  Abbie Sons, MD   oxyCODONE-acetaminophen 5-325 MG tablet Commonly known as:  Percocet Stopped by:  Abbie Sons, MD   tamsulosin 0.4 MG Caps capsule Commonly known as:  Flomax Stopped by:  Abbie Sons, MD     TAKE these medications   Anucort-HC 25 MG suppository Generic drug:  hydrocortisone UNWRAP AND INSERT 1 SUPPOSITORY INTO THE RECTUM 3 TIMES DAILY AS NEEDED   busPIRone 10 MG tablet Commonly known as:  BUSPAR TAKE 1 TABLET BY MOUTH TWICE A DAY   esomeprazole 20 MG capsule Commonly known as:  NEXIUM Take 20 mg by mouth as needed.   fluticasone 50 MCG/ACT nasal spray Commonly known as:  FLONASE Place 2 sprays into both nostrils daily. In each nostril   ibuprofen 200 MG tablet Commonly known as:  ADVIL Take 200-400 mg by mouth every 6 (six) hours as needed.       Allergies:  Allergies  Allergen Reactions  . Citalopram Hydrobromide     REACTION: unspecified  . Penicillins Other (See Comments)    REACTION: questionable REACTION: questionable    Family History: Family History  Problem Relation Age of Onset  . Diabetes Father   . Heart disease Father   . Dementia Father   . Dementia Paternal Grandmother   . CAD Neg Hx   . Hypertension Neg Hx   .  Cancer Neg Hx     Social History:  reports that he has never smoked. He has never used smokeless tobacco. He reports current alcohol use. He reports that he does not use drugs.  ROS: UROLOGY Frequent Urination?: No Hard to postpone urination?: No Burning/pain with urination?: No Get up at night to urinate?: No Leakage of urine?: No Urine stream starts and stops?: No Trouble starting stream?: No Do you have to strain to urinate?: No Blood in urine?: No Urinary tract infection?: No Sexually transmitted disease?: No Injury to kidneys or bladder?: No Painful intercourse?: No Weak stream?: No Erection problems?: No Penile pain?: No  Gastrointestinal Nausea?: No Vomiting?: No  Indigestion/heartburn?: No Diarrhea?: No Constipation?: No  Constitutional Fever: No Night sweats?: No Weight loss?: No Fatigue?: No  Skin Skin rash/lesions?: No Itching?: No  Eyes Blurred vision?: No Double vision?: No  Ears/Nose/Throat Sore throat?: No Sinus problems?: No  Hematologic/Lymphatic Swollen glands?: No Easy bruising?: No  Cardiovascular Leg swelling?: No Chest pain?: No  Respiratory Cough?: No Shortness of breath?: No  Endocrine Excessive thirst?: No  Musculoskeletal Back pain?: No Joint pain?: No  Neurological Headaches?: No Dizziness?: No  Psychologic Depression?: No Anxiety?: No  Physical Exam: BP 137/82   Pulse 70   Ht 5\' 11"  (1.803 m)   Wt 181 lb (82.1 kg)   BMI 25.24 kg/m   Constitutional:  Alert and oriented, No acute distress. HEENT: [place] AT, moist mucus membranes.  Trachea midline, no masses.   Assessment & Plan:   Doing well status post right ureteroscopic stone removal.  Although he is a recurrent stone former his initial stone was 15 years ago.  We discussed a metabolic evaluation versus general stone prevention guidelines.  He has elected the latter.  It was recommended he increase his water intake to keep urine output greater than 2 L per day.  Ten 10 ounce glasses of water per day is generally enough to produce this output.  Oxalate moderation was discussed and she was provided literature on high oxalate foods and beverages.  Avoidance of salty foods and added salt was discussed as well as avoidance of excessive intake of animal protein.  Increased intake of potassium rich citrus products was recommended.  Return in about 6 months (around 03/07/2019) for Recheck, KUB.   Abbie Sons, [place] [address] [place], [place] [address] [phone]

## 2018-09-05 ENCOUNTER — Telehealth: Payer: BC Managed Care – PPO | Admitting: Urology

## 2018-09-07 ENCOUNTER — Other Ambulatory Visit: Payer: Self-pay | Admitting: Urology

## 2018-10-05 NOTE — Progress Notes (Signed)
Stone analysis was primarily calcium oxalate  Patient notified and voiced understanding.

## 2019-02-04 ENCOUNTER — Other Ambulatory Visit: Payer: Self-pay | Admitting: Internal Medicine

## 2019-03-07 ENCOUNTER — Ambulatory Visit
Admission: RE | Admit: 2019-03-07 | Discharge: 2019-03-07 | Disposition: A | Discharge status: Home / Self Care | Payer: BC Managed Care – PPO | Source: Ambulatory Visit | Attending: Urology | Admitting: Urology

## 2019-03-07 ENCOUNTER — Encounter: Payer: Self-pay | Admitting: Internal Medicine

## 2019-03-07 ENCOUNTER — Ambulatory Visit: Payer: BC Managed Care – PPO | Admitting: Urology

## 2019-03-07 ENCOUNTER — Encounter: Payer: Self-pay | Admitting: Urology

## 2019-03-07 ENCOUNTER — Other Ambulatory Visit: Payer: Self-pay

## 2019-03-07 ENCOUNTER — Ambulatory Visit: Payer: BC Managed Care – PPO | Admitting: Internal Medicine

## 2019-03-07 VITALS — BP 121/77 | HR 68 | Ht 71.0 in | Wt 165.0 lb

## 2019-03-07 DIAGNOSIS — Z87442 Personal history of urinary calculi: Secondary | ICD-10-CM

## 2019-03-07 DIAGNOSIS — K4090 Unilateral inguinal hernia, without obstruction or gangrene, not specified as recurrent: Secondary | ICD-10-CM | POA: Insufficient documentation

## 2019-03-07 NOTE — Assessment & Plan Note (Signed)
Small and no symptoms Discussed ways to minimize further damage Will refer to surgeon if worsens

## 2019-03-07 NOTE — Progress Notes (Signed)
03/07/2019 9:57 AM   Cameron [name] [birth year] [id number]  Referring provider: Venia Carbon, MD [address] [place],  [place] [address]  Chief Complaint  Patient presents with  . Follow-up    Urologic history: 1.  History stone disease -Ureteroscopic removal 7 mm right distal calculus 07/2018 -1 previous stone 15 years prior   HPI: 63 y.o. male presents for 25-month follow-up.  Since his last visit he has done well.  Denies flank, abdominal or pelvic pain.  No voiding symptoms or hematuria.   PMH: Past Medical History:  Diagnosis Date  . Abnormal MRI 2007   nonspecific white matter abnormalities  . Allergy   . Anxiety   . GERD (gastroesophageal reflux disease)   . History of kidney stones   . Hyperlipidemia   . Migraines    OCULAR    Surgical History: Past Surgical History:  Procedure Laterality Date  . CYSTOSCOPY/URETEROSCOPY/HOLMIUM LASER/STENT PLACEMENT Right 07/27/2018   Procedure: CYSTOSCOPY/URETEROSCOPY/HOLMIUM LASER/STENT PLACEMENT;  Surgeon: Abbie Sons, MD;  Location: [facility];  Service: Urology;  Laterality: Right;  . URETEROLITHOTOMY  11/2001  . US ECHOCARDIOGRAPHY  11/2004   normal, Carotids normal    Home Medications:  Allergies as of 03/07/2019      Reactions   Citalopram Hydrobromide    REACTION: unspecified   Penicillins Other (See Comments)   REACTION: questionable REACTION: questionable      Medication List       Accurate as of March 07, 2019  9:57 AM. If you have any questions, ask your nurse or doctor.        Anucort-HC 25 MG suppository Generic drug: hydrocortisone UNWRAP AND INSERT 1 SUPPOSITORY INTO THE RECTUM 3 TIMES DAILY AS NEEDED   busPIRone 10 MG tablet Commonly known as: BUSPAR TAKE 1 TABLET BY MOUTH TWICE A DAY   esomeprazole 20 MG capsule Commonly known as: NEXIUM Take 20 mg by mouth as needed.   fluticasone 50 MCG/ACT nasal spray Commonly known as: FLONASE Place 2 sprays into both  nostrils daily. In each nostril   ibuprofen 200 MG tablet Commonly known as: ADVIL Take 200-400 mg by mouth every 6 (six) hours as needed.       Allergies:  Allergies  Allergen Reactions  . Citalopram Hydrobromide     REACTION: unspecified  . Penicillins Other (See Comments)    REACTION: questionable REACTION: questionable    Family History: Family History  Problem Relation Age of Onset  . Diabetes Father   . Heart disease Father   . Dementia Father   . Dementia Paternal Grandmother   . CAD Neg Hx   . Hypertension Neg Hx   . Cancer Neg Hx     Social History:  reports that he has never smoked. He has never used smokeless tobacco. He reports current alcohol use. He reports that he does not use drugs.  ROS: UROLOGY Frequent Urination?: No Hard to postpone urination?: No Burning/pain with urination?: No Get up at night to urinate?: No Leakage of urine?: No Urine stream starts and stops?: No Trouble starting stream?: No Do you have to strain to urinate?: No Blood in urine?: No Urinary tract infection?: No Sexually transmitted disease?: No Injury to kidneys or bladder?: No Painful intercourse?: No Weak stream?: No Erection problems?: No Penile pain?: No  Gastrointestinal Nausea?: No Vomiting?: No Indigestion/heartburn?: No Diarrhea?: No Constipation?: No  Constitutional Fever: No Night sweats?: No Weight loss?: No Fatigue?: No  Skin Skin rash/lesions?: No Itching?:  No  Eyes Blurred vision?: No Double vision?: No  Ears/Nose/Throat Sore throat?: No Sinus problems?: No  Hematologic/Lymphatic Swollen glands?: No Easy bruising?: No  Cardiovascular Leg swelling?: No Chest pain?: No  Respiratory Cough?: No Shortness of breath?: No  Endocrine Excessive thirst?: No  Musculoskeletal Back pain?: No Joint pain?: No  Neurological Headaches?: No Dizziness?: No  Psychologic Depression?: No Anxiety?: No  Physical Exam: BP 121/77    Pulse 68   Ht 5\' 11"  (1.803 m)   Wt 165 lb (74.8 kg)   BMI 23.01 kg/m   Constitutional:  Alert and oriented, No acute distress. HEENT: [place] AT, moist mucus membranes.  Trachea midline, no masses. Cardiovascular: No clubbing, cyanosis, or edema. Respiratory: Normal respiratory effort, no increased work of breathing.   Assessment & Plan:    - History stone disease Doing well.  KUB today and if no evidence recurrent stone follow-up 1 year with KUB.   Abbie Sons, [place] [address] [place], [place] [address] [phone]

## 2019-03-07 NOTE — Progress Notes (Signed)
Subjective:    Patient ID: Cameron [name], male    DOB: [birth year], 63 y.o.   MRN: [id number]  HPI Here due to small bulge in his left groin Noticed it about 6 weeks ago Not painful but wanted to be sure it wasn't serious Hasn't changed  Has been walking a lot Monitoring sweets, etc  Current Outpatient Medications on File Prior to Visit  Medication Sig Dispense Refill  . ANUCORT-HC 25 MG suppository UNWRAP AND INSERT 1 SUPPOSITORY INTO THE RECTUM 3 TIMES DAILY AS NEEDED 30 suppository 0  . busPIRone (BUSPAR) 10 MG tablet TAKE 1 TABLET BY MOUTH TWICE A DAY 180 tablet 0  . esomeprazole (NEXIUM) 20 MG capsule Take 20 mg by mouth as needed.     . fluticasone (FLONASE) 50 MCG/ACT nasal spray Place 2 sprays into both nostrils daily. In each nostril 16 g 12  . ibuprofen (ADVIL,MOTRIN) 200 MG tablet Take 200-400 mg by mouth every 6 (six) hours as needed.     No current facility-administered medications on file prior to visit.     Allergies  Allergen Reactions  . Citalopram Hydrobromide     REACTION: unspecified  . Penicillins Other (See Comments)    REACTION: questionable REACTION: questionable    Past Medical History:  Diagnosis Date  . Abnormal MRI 2007   nonspecific white matter abnormalities  . Allergy   . Anxiety   . GERD (gastroesophageal reflux disease)   . History of kidney stones   . Hyperlipidemia   . Migraines    OCULAR    Past Surgical History:  Procedure Laterality Date  . CYSTOSCOPY/URETEROSCOPY/HOLMIUM LASER/STENT PLACEMENT Right 07/27/2018   Procedure: CYSTOSCOPY/URETEROSCOPY/HOLMIUM LASER/STENT PLACEMENT;  Surgeon: Abbie Sons, MD;  Location: [facility];  Service: Urology;  Laterality: Right;  . URETEROLITHOTOMY  11/2001  . US ECHOCARDIOGRAPHY  11/2004   normal, Carotids normal    Family History  Problem Relation Age of Onset  . Diabetes Father   . Heart disease Father   . Dementia Father   . Dementia Paternal Grandmother   . CAD Neg Hx   .  Hypertension Neg Hx   . Cancer Neg Hx     Social History   Socioeconomic History  . Marital status: Married    Spouse name: Not on file  . Number of children: Not on file  . Years of education: Not on file  . Highest education level: Not on file  Occupational History  . 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 Financial resource strain: Not on file  . Food insecurity    Worry: Not on file    Inability: Not on file  . Transportation needs    Medical: Not on file    Non-medical: Not on file  Tobacco Use  . Smoking status: Never Smoker  . Smokeless tobacco: Never Used  Substance and Sexual Activity  . Alcohol use: Yes    Alcohol/week: 0.0 standard drinks    Comment: rare  . Drug use: No  . Sexual activity: Not on file  Lifestyle  . Physical activity    Days per week: Not on file    Minutes per session: Not on file  . Stress: Not on file  Relationships  . Social Herbalist on phone: Not on file    Gets together: Not on file    Attends religious service: Not on file    Active member  of club or organization: Not on file    Attends meetings of clubs or organizations: Not on file    Relationship status: Not on file  . Intimate partner violence    Fear of current or ex partner: Not on file    Emotionally abused: Not on file    Physically abused: Not on file    Forced sexual activity: Not on file  Other Topics Concern  . Not on file  Social History Narrative   Long standing relationship with partner--finally able to marry    Review of Systems  Voids fine Rare constipation--generally doesn't strain No fever--not sick     Objective:   Physical Exam  Constitutional: He appears well-developed. No distress.  Genitourinary:    Genitourinary Comments: Small left inguinal hernia---easily reducible even when standing Not into scrotum            Assessment & Plan:

## 2019-03-29 ENCOUNTER — Other Ambulatory Visit: Payer: Self-pay | Admitting: Internal Medicine

## 2019-04-29 ENCOUNTER — Ambulatory Visit (INDEPENDENT_AMBULATORY_CARE_PROVIDER_SITE_OTHER): Payer: BC Managed Care – PPO | Admitting: Internal Medicine

## 2019-04-29 ENCOUNTER — Other Ambulatory Visit: Payer: Self-pay

## 2019-04-29 ENCOUNTER — Encounter: Payer: Self-pay | Admitting: Internal Medicine

## 2019-04-29 VITALS — BP 108/72 | HR 64 | Temp 97.1°F | Resp 18 | Ht 71.0 in | Wt 174.0 lb

## 2019-04-29 DIAGNOSIS — Z87442 Personal history of urinary calculi: Secondary | ICD-10-CM | POA: Diagnosis not present

## 2019-04-29 DIAGNOSIS — Z1211 Encounter for screening for malignant neoplasm of colon: Secondary | ICD-10-CM

## 2019-04-29 DIAGNOSIS — Z0000 Encounter for general adult medical examination without abnormal findings: Secondary | ICD-10-CM

## 2019-04-29 DIAGNOSIS — F419 Anxiety disorder, unspecified: Secondary | ICD-10-CM | POA: Diagnosis not present

## 2019-04-29 DIAGNOSIS — Z125 Encounter for screening for malignant neoplasm of prostate: Secondary | ICD-10-CM | POA: Diagnosis not present

## 2019-04-29 LAB — COMPREHENSIVE METABOLIC PANEL
ALT: 10 U/L (ref 0–53)
AST: 16 U/L (ref 0–37)
Albumin: 4.3 g/dL (ref 3.5–5.2)
Alkaline Phosphatase: 66 U/L (ref 39–117)
BUN: 11 mg/dL (ref 6–23)
CO2: 29 mEq/L (ref 19–32)
Calcium: 9.3 mg/dL (ref 8.4–10.5)
Chloride: 104 mEq/L (ref 96–112)
Creatinine, Ser: 0.98 mg/dL (ref 0.40–1.50)
GFR: 77.33 mL/min (ref >60.00)
Glucose, Bld: 72 mg/dL (ref 70–99)
Potassium: 4.6 mEq/L (ref 3.5–5.1)
Sodium: 138 mEq/L (ref 135–145)
Total Bilirubin: 0.5 mg/dL (ref 0.2–1.2)
Total Protein: 7.2 g/dL (ref 6.0–8.3)

## 2019-04-29 LAB — CBC
HCT: 44.6 % (ref 39.0–52.0)
Hemoglobin: 15.3 g/dL (ref 13.0–17.0)
MCHC: 34.3 g/dL (ref 30.0–36.0)
MCV: 91.3 fl (ref 78.0–100.0)
Platelets: 253 10*3/uL (ref 150.0–400.0)
RBC: 4.89 Mil/uL (ref 4.22–5.81)
RDW: 13 % (ref 11.5–15.5)
WBC: 5.8 10*3/uL (ref 4.0–10.5)

## 2019-04-29 LAB — PSA: PSA: 0.67 ng/mL (ref 0.10–4.00)

## 2019-04-29 NOTE — Assessment & Plan Note (Signed)
Continues on buspirone

## 2019-04-29 NOTE — Assessment & Plan Note (Signed)
Calcium stones Two significant spells Will recheck labs

## 2019-04-29 NOTE — Assessment & Plan Note (Signed)
Healthy Discussed fitness Discussed PSA---will check FIT Flu vaccine yearly COVID when available

## 2019-04-29 NOTE — Progress Notes (Signed)
Subjective:    Patient ID: Cameron [name], male    DOB: April 11, 1956, 64 y.o.   MRN: [id number]  HPI Here for physical  This visit occurred during the SARS-CoV-2 public health emergency.  Safety protocols were in place, including screening questions prior to the visit, additional usage of staff PPE, and extensive cleaning of exam room while observing appropriate contact time as indicated for disinfecting solutions.   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 busPIRone (BUSPAR) 10 MG tablet TAKE 1 TABLET BY MOUTH TWICE A DAY 180 tablet 0  . esomeprazole (NEXIUM) 20 MG capsule Take 20 mg by mouth as needed.     . fluticasone (FLONASE) 50 MCG/ACT nasal spray Place 2 sprays into both nostrils daily. In each nostril 16 g 12  . hydrocortisone (ANUSOL-HC) 25 MG suppository UNWRAP AND INSERT 1 SUPPOSITORY INTO THE RECTUM 3 TIMES DAILY AS NEEDED 30 suppository 0  . ibuprofen (ADVIL,MOTRIN) 200 MG tablet Take 200-400 mg by mouth every 6 (six) hours as needed.     No current facility-administered medications on file prior to visit.    Allergies  Allergen Reactions  . Citalopram Hydrobromide     REACTION: unspecified  . Penicillins Other (See Comments)    REACTION: questionable REACTION: questionable    Past Medical History:  Diagnosis Date  . Abnormal MRI 2007   nonspecific white matter abnormalities  . Allergy   . Anxiety   . GERD (gastroesophageal reflux disease)   . History of kidney stones   . Hyperlipidemia   . Migraines    OCULAR    Past Surgical History:  Procedure Laterality Date  . CYSTOSCOPY/URETEROSCOPY/HOLMIUM LASER/STENT PLACEMENT Right 07/27/2018   Procedure: CYSTOSCOPY/URETEROSCOPY/HOLMIUM LASER/STENT PLACEMENT;  Surgeon: Abbie Sons, MD;  Location:  [facility] OR[facility];  Service: Urology;  Laterality: Right;  . URETEROLITHOTOMY  11/2001  . US ECHOCARDIOGRAPHY  11/2004   normal, Carotids normal    Family History  Problem Relation Age of Onset  . Diabetes Father   . Heart disease Father   . Dementia Father   . Dementia Paternal Grandmother   . CAD Neg Hx   . Hypertension Neg Hx   . Cancer Neg Hx     Social History   Socioeconomic History  . Marital status: Married    Spouse name: Not on file  . Number of children: Not on file  . Years of education: Not on file  . Highest education level: Not on file  Occupational History  . 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 Smoking status: Never Smoker  . Smokeless tobacco: Never Used  Substance and Sexual Activity  . Alcohol use: Yes    Alcohol/week: 0.0 standard drinks    Comment: rare  . Drug use: No  . Sexual activity: Not on file  Other Topics Concern  . Not on file  Social History Narrative   Long standing relationship with partner--finally able to marry   Social Determinants of Health   Financial Resource Strain:   . Difficulty of Paying Living Expenses: Not on file  Food Insecurity:   . Worried About Charity fundraiser in the Last Year: Not on file  . Ran Out of Food  in the Last Year: Not on file  Transportation Needs:   . Lack of Transportation (Medical): Not on file  . Lack of Transportation (Non-Medical): Not on file  Physical Activity:   . Days of Exercise per Week: Not on file  . Minutes of Exercise per Session: Not on file  Stress:   . Feeling of Stress : Not on file  Social Connections:   . Frequency of Communication with Friends and Family: Not on file  . Frequency of Social Gatherings with Friends and Family: Not on file  . Attends Religious Services: Not on file  . Active Member of Clubs or Organizations: Not on file  . Attends Archivist Meetings: Not on file  . Marital Status: Not on  file  Intimate Partner Violence:   . Fear of Current or Ex-Partner: Not on file  . Emotionally Abused: Not on file  . Physically Abused: Not on file  . Sexually Abused: Not on file   Review of Systems  Constitutional: Negative for fatigue and unexpected weight change.       Wears seat belt  HENT: Negative for dental problem, hearing loss and trouble swallowing.        Slight tinnitus after the buspar--brief  Eyes: Negative for visual disturbance.       No diplopia or unilateral vision loss  Respiratory: Negative for cough, chest tightness and shortness of breath.   Cardiovascular: Negative for chest pain.       No palpitations since cutting out sugar  Gastrointestinal: Negative for blood in stool and constipation.       Occ reflux---controlled by nexium  Endocrine: Negative for polydipsia and polyuria.  Genitourinary: Positive for frequency. Negative for urgency.       Slight dribbling No sexual problems  Musculoskeletal: Positive for back pain. Negative for arthralgias and joint swelling.  Skin: Negative for rash.  Allergic/Immunologic: Positive for environmental allergies. Negative for immunocompromised state.       Minor symptoms--uses flonase at times  Neurological: Negative for dizziness, syncope and light-headedness.       Balance off at times--left leg slow to respond (sporadic) This may come on when exposed to bright fluorescent lighting Occasional ocular migraines---vision changes without headache (but will have fatigue)  Hematological: Negative for adenopathy. Does not bruise/bleed easily.  Psychiatric/Behavioral: Negative for dysphoric mood.       Sleeps well from 8-2---then up for a while. Then gets back to sleep Anxiety controlled with the buspirone--can tell if he misses dose       Objective:   Physical Exam  Constitutional: He is oriented to person, place, and time. He appears well-developed. No distress.  HENT:  Head: Normocephalic and atraumatic.  Right  Ear: External ear normal.  Left Ear: External ear normal.  Mouth/Throat: Oropharynx is clear and moist. No oropharyngeal exudate.  Eyes: Pupils are equal, round, and reactive to light. Conjunctivae are normal.  Neck: No thyromegaly present.  Cardiovascular: Normal rate, regular rhythm, normal heart sounds and intact distal pulses. Exam reveals no gallop.  No murmur heard. Respiratory: Effort normal and breath sounds normal. No respiratory distress. He has no wheezes. He has no rales.  GI: Soft. There is no abdominal tenderness.  Very slight left inguinal hernia  Musculoskeletal:        General: No tenderness or edema.  Lymphadenopathy:    He has no cervical adenopathy.  Neurological: He is alert and oriented to person, place, and time.  Skin: No rash noted. No  erythema.  Psychiatric: He has a normal mood and affect. His behavior is normal.           Assessment & Plan:

## 2019-05-02 ENCOUNTER — Other Ambulatory Visit: Payer: Self-pay | Admitting: Internal Medicine

## 2019-05-03 ENCOUNTER — Other Ambulatory Visit (INDEPENDENT_AMBULATORY_CARE_PROVIDER_SITE_OTHER): Payer: BC Managed Care – PPO

## 2019-05-03 DIAGNOSIS — Z1211 Encounter for screening for malignant neoplasm of colon: Secondary | ICD-10-CM

## 2019-05-06 LAB — FECAL OCCULT BLOOD, IMMUNOCHEMICAL: Fecal Occult Bld: NEGATIVE

## 2019-06-22 ENCOUNTER — Ambulatory Visit: Payer: Self-pay

## 2019-06-27 ENCOUNTER — Ambulatory Visit: Payer: BC Managed Care – PPO | Attending: Internal Medicine

## 2019-06-27 DIAGNOSIS — Z23 Encounter for immunization: Secondary | ICD-10-CM

## 2019-06-27 NOTE — Progress Notes (Signed)
   Covid-19 Vaccination Clinic  Name:  Cameron [name]    MRN: [id number] DOB: Apr 19, 1955  06/27/2019  Mr. Delconte was observed post Covid-19 immunization for 15 minutes without incident. He was provided with Vaccine Information Sheet and instruction to access the V-Safe system.   Mr. Bondi was instructed to call 911 with any severe reactions post vaccine: Marland Kitchen Difficulty breathing  . Swelling of face and throat  . A fast heartbeat  . A bad rash all over body  . Dizziness and weakness   Immunizations Administered    Name Date Dose VIS Date Route   Pfizer COVID-19 Vaccine 06/27/2019  1:08 PM 0.3 mL 03/29/2019 Intramuscular   Manufacturer: [place]   Lot: [id number]   [place]: [id number]

## 2019-07-22 ENCOUNTER — Ambulatory Visit: Payer: Self-pay

## 2019-07-22 ENCOUNTER — Ambulatory Visit: Payer: BC Managed Care – PPO | Attending: Internal Medicine

## 2019-07-22 DIAGNOSIS — Z23 Encounter for immunization: Secondary | ICD-10-CM

## 2019-07-22 NOTE — Progress Notes (Signed)
   Covid-19 Vaccination Clinic  Name:  Cameron [name]    MRN: [id number] DOB: 1955/12/16  07/22/2019  Mr. Seiden was observed post Covid-19 immunization for 15 minutes without incident. He was provided with Vaccine Information Sheet and instruction to access the V-Safe system.   Mr. Montee was instructed to call 911 with any severe reactions post vaccine: Marland Kitchen Difficulty breathing  . Swelling of face and throat  . A fast heartbeat  . A bad rash all over body  . Dizziness and weakness   Immunizations Administered    Name Date Dose VIS Date Route   Pfizer COVID-19 Vaccine 07/22/2019  2:56 PM 0.3 mL 03/29/2019 Intramuscular   Manufacturer: [place]   Lot: [id number]   [place]: [id number]

## 2019-09-24 ENCOUNTER — Ambulatory Visit: Payer: BC Managed Care – PPO | Admitting: Internal Medicine

## 2019-09-24 ENCOUNTER — Other Ambulatory Visit: Payer: Self-pay

## 2019-09-24 ENCOUNTER — Encounter: Payer: Self-pay | Admitting: Internal Medicine

## 2019-09-24 VITALS — BP 130/80 | HR 72 | Temp 97.7°F | Ht 71.0 in | Wt 173.0 lb

## 2019-09-24 DIAGNOSIS — R2689 Other abnormalities of gait and mobility: Secondary | ICD-10-CM | POA: Diagnosis not present

## 2019-09-24 DIAGNOSIS — R930 Abnormal findings on diagnostic imaging of skull and head, not elsewhere classified: Secondary | ICD-10-CM | POA: Insufficient documentation

## 2019-09-24 NOTE — Progress Notes (Signed)
Subjective:    Patient ID: Cameron [name], male    DOB: Mar 17, 1956, 64 y.o.   MRN: [id number]  HPI Here due to vision problems This visit occurred during the SARS-CoV-2 public health emergency.  Safety protocols were in place, including screening questions prior to the visit, additional usage of staff PPE, and extensive cleaning of exam room while observing appropriate contact time as indicated for disinfecting solutions.   Past MRI abnormality consistent with MS Now having some vision changes---then headache--then fatigue Goes back 15+ years but very rare (might be triggered by fluorescent lights) Now happening several times a week  Will go out for walk and notice problems with balance--feels unsteady Feels his vision affects his balance--but no loss or vision or aura Tends to lean to the left Then gets headache ("annoying")--then fatigue which is better after 1-2 hour rest Hard to concentrate when this happens Headache severity is variable--usually across forehead but rarely across occiput Feels like "band" or "tension" Ibuprofen will help--but then gets fatigued  Current Outpatient Medications on File Prior to Visit  Medication Sig Dispense Refill  . esomeprazole (NEXIUM) 20 MG capsule Take 20 mg by mouth as needed.     . fluticasone (FLONASE) 50 MCG/ACT nasal spray Place 2 sprays into both nostrils daily. In each nostril 16 g 12  . hydrocortisone (ANUSOL-HC) 25 MG suppository UNWRAP AND INSERT 1 SUPPOSITORY INTO THE RECTUM 3 TIMES DAILY AS NEEDED 30 suppository 0  . ibuprofen (ADVIL,MOTRIN) 200 MG tablet Take 200-400 mg by mouth every 6 (six) hours as needed.    . busPIRone (BUSPAR) 10 MG tablet TAKE 1 TABLET BY MOUTH TWICE A DAY (Patient not taking: Reported on 09/24/2019) 180 tablet 1   No current facility-administered medications on file prior to visit.    Allergies  Allergen Reactions  . Citalopram Hydrobromide     REACTION: unspecified  . Penicillins Other (See Comments)     REACTION: questionable REACTION: questionable    Past Medical History:  Diagnosis Date  . Abnormal MRI 2007   nonspecific white matter abnormalities  . Allergy   . Anxiety   . GERD (gastroesophageal reflux disease)   . History of kidney stones   . Hyperlipidemia   . Migraines    OCULAR    Past Surgical History:  Procedure Laterality Date  . CYSTOSCOPY/URETEROSCOPY/HOLMIUM LASER/STENT PLACEMENT Right 07/27/2018   Procedure: CYSTOSCOPY/URETEROSCOPY/HOLMIUM LASER/STENT PLACEMENT;  Surgeon: Abbie Sons, MD;  Location: [facility] ORS;  Service: Urology;  Laterality: Right;  . URETEROLITHOTOMY  11/2001  . US ECHOCARDIOGRAPHY  11/2004   normal, Carotids normal    Family History  Problem Relation Age of Onset  . Diabetes Father   . Heart disease Father   . Dementia Father   . Dementia Paternal Grandmother   . CAD Neg Hx   . Hypertension Neg Hx   . Cancer Neg Hx     Social History   Socioeconomic History  . Marital status: Married    Spouse name: Not on file  . Number of children: Not on file  . Years of education: Not on file  . Highest education level: Not on file  Occupational History  . 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 Smoking status: Never Smoker  . Smokeless tobacco: Never Used  Substance and Sexual Activity  . Alcohol use: Yes    Alcohol/week: 0.0 standard drinks    Comment: rare  . Drug use: No  .  Sexual activity: Not on file  Other Topics Concern  . Not on file  Social History Narrative   Long standing relationship with partner--finally able to marry   Social Determinants of Health   Financial Resource Strain:   . Difficulty of Paying Living Expenses:   Food Insecurity:   . Worried About Charity fundraiser in the Last Year:   . Arboriculturist in the Last Year:   Transportation Needs:   . Film/video editor (Medical):   Marland Kitchen Lack of Transportation (Non-Medical):   Physical  Activity:   . Days of Exercise per Week:   . Minutes of Exercise per Session:   Stress:   . Feeling of Stress :   Social Connections:   . Frequency of Communication with Friends and Family:   . Frequency of Social Gatherings with Friends and Family:   . Attends Religious Services:   . Active Member of Clubs or Organizations:   . Attends Archivist Meetings:   Marland Kitchen Marital Status:   Intimate Partner Violence:   . Fear of Current or Ex-Partner:   . Emotionally Abused:   Marland Kitchen Physically Abused:   . Sexually Abused:    Review of Systems No diplopia  No focal weakness Has numbness in left 5th finger--related to past fall and injury No cognitive issues--other than in the fatigue period Still works out--but not as regularly lately (mostly doing garden work)    Objective:   Physical Exam  Constitutional: He is oriented to person, place, and time. He appears well-developed. No distress.  Cardiovascular: Normal rate, regular rhythm and normal heart sounds. Exam reveals no gallop.  No murmur heard. Neurological: He is alert and oriented to person, place, and time. He has normal strength. He displays no tremor. No cranial nerve deficit. He exhibits normal muscle tone. He displays a negative Romberg sign. Coordination and gait normal.           Assessment & Plan:

## 2019-09-24 NOTE — Assessment & Plan Note (Addendum)
Known white matter abnormalities in 2007 Course is not consistent with vasculitis Could be MS Not typical for white matter abnormalities to be with migraines but still possible Will defer MRI to neurologist since I am not sure if they would want angiogram as well

## 2019-09-24 NOTE — Assessment & Plan Note (Signed)
Hard to tell if this could be migraine prodrome or other neurologic symptom Ibuprofen does help--he should use this for now Known abnormal MRI that could indicate MS--will need to follow up on this and with neurology

## 2019-10-09 ENCOUNTER — Encounter: Payer: Self-pay | Admitting: Neurology

## 2019-10-09 ENCOUNTER — Other Ambulatory Visit: Payer: Self-pay

## 2019-10-09 ENCOUNTER — Ambulatory Visit: Payer: BC Managed Care – PPO | Admitting: Neurology

## 2019-10-09 VITALS — BP 112/79 | HR 68 | Ht 71.0 in | Wt 178.4 lb

## 2019-10-09 DIAGNOSIS — G43109 Migraine with aura, not intractable, without status migrainosus: Secondary | ICD-10-CM

## 2019-10-09 DIAGNOSIS — R9082 White matter disease, unspecified: Secondary | ICD-10-CM | POA: Diagnosis not present

## 2019-10-09 DIAGNOSIS — R2689 Other abnormalities of gait and mobility: Secondary | ICD-10-CM

## 2019-10-09 DIAGNOSIS — M21372 Foot drop, left foot: Secondary | ICD-10-CM

## 2019-10-09 DIAGNOSIS — H539 Unspecified visual disturbance: Secondary | ICD-10-CM

## 2019-10-09 MED ORDER — IMIPRAMINE HCL 25 MG PO TABS: 25.0000 mg | ORAL_TABLET | Freq: Every day | ORAL | 5 refills | Status: DC

## 2019-10-09 MED ORDER — ALPRAZOLAM 0.5 MG PO TABS: ORAL_TABLET | ORAL | 0 refills | Status: DC

## 2019-10-09 NOTE — Progress Notes (Signed)
[facility]  PATIENT: PRESLEY SUMMERLIN DOB: [birth year]  REFERRING DOCTOR OR PCP: Viviana Simpler MD SOURCE: Patient, notes from primary care, imaging and laboratory reports, MRI images personally reviewed.  _________________________________   HISTORICAL  CHIEF COMPLAINT:  Chief Complaint  Patient presents with  . New Patient (Initial Visit)    RM 13. Internal referral from Viviana Simpler, MD (PCP) for balance disorder, abnormal MRI head.     HISTORY OF PRESENT ILLNESS:  I had the pleasure of seeing patient, Demarquez Ciolek, at [facility] for neurologic consultation regarding his fluctuating visual disturbance, reduced balance, foot drop and other symptoms.  Additionally, he has an abnormal brain MRI.  He is a 64 year old man who began to experience some neurologic symptoms in 2007.  That year, he had the onset of difficulties with eye movements.  Specifically, he was having difficulty tracking smoothly.   Over 2 monhs, his symptoms improved.   Since then he has noted that he feels disoriented, has trouble coming up with the right words and veers to the left intermittently, especially in bright fluorescent lights as in some stores.    In the past,  this would happen just once a month but now is occurring several times a week.   With the event he has a headache and feels very fatigued.    If able to, he tries to take a nap and feels better when he wakes up.   These spells can be triggered by light shining through trees while walking.     Currently, his gait is fine most of the time (when not having a spell).    He always feels his right side is stronger than his left and he can balance better when stanfding on his right foot --- all of his adult life.     However, since the 90's he was noted to occasioanl drag the left foot and this happens more now and leads to stumbles and had one fall due to this.  No current visual issues.   He notes some urinary  urgency but no incontinence.        I personally reviewed the MRIs of the brain from 10/11/2005 and 10/29/2012.  The MRI show multiple T2/FLAIR hyperintense foci, at least 20, with some in the periventricular white matter and some in the juxtacortical white matter.  There was U-fiber involvement of a couple foci in the posterior left frontal lobe.  Between 2007 and 2014, 2-3 more lesions developed (the new lesions were mostly deep white matter).  We do not have any images of the cervical or thoracic spine spine so the spinal cord could not be reviewed.  REVIEW OF SYSTEMS: Constitutional: No fevers, chills, sweats, or change in appetite Eyes: As above Ear, nose and throat: No hearing loss, ear pain, nasal congestion, sore throat Cardiovascular: No chest pain, palpitations Respiratory: No shortness of breath at rest or with exertion.   No wheezes GastrointestinaI: No nausea, vomiting, diarrhea, abdominal pain, fecal incontinence Genitourinary: No dysuria, urinary retention.  He has mild frequency and nocturia.  He has a history of kidney stones. Musculoskeletal: No neck pain, back pain Integumentary: No rash, pruritus, skin lesions Neurological: as above Psychiatric: No depression at this time.  No anxiety Endocrine: No palpitations, diaphoresis, change in appetite, change in weigh or increased thirst Hematologic/Lymphatic: No anemia, purpura, petechiae. Allergic/Immunologic: No itchy/runny eyes, nasal congestion, recent allergic reactions, rashes  ALLERGIES: Allergies  Allergen Reactions  . Citalopram Hydrobromide  REACTION: unspecified  . Penicillins Other (See Comments)    REACTION: questionable REACTION: questionable    HOME MEDICATIONS:  Current Outpatient Medications:  .  esomeprazole (NEXIUM) 20 MG capsule, Take 20 mg by mouth as needed. , Disp: , Rfl:  .  fluticasone (FLONASE) 50 MCG/ACT nasal spray, Place 2 sprays into both nostrils daily. In each nostril, Disp: 16 g,  Rfl: 12 .  hydrocortisone (ANUSOL-HC) 25 MG suppository, UNWRAP AND INSERT 1 SUPPOSITORY INTO THE RECTUM 3 TIMES DAILY AS NEEDED, Disp: 30 suppository, Rfl: 0 .  ibuprofen (ADVIL,MOTRIN) 200 MG tablet, Take 200-400 mg by mouth every 6 (six) hours as needed., Disp: , Rfl:  .  busPIRone (BUSPAR) 10 MG tablet, TAKE 1 TABLET BY MOUTH TWICE A DAY (Patient not taking: Reported on 10/09/2019), Disp: 180 tablet, Rfl: 1  PAST MEDICAL HISTORY: Past Medical History:  Diagnosis Date  . Abnormal MRI 2007   nonspecific white matter abnormalities  . Allergy   . Anxiety   . GERD (gastroesophageal reflux disease)   . History of kidney stones   . Hyperlipidemia   . Migraines    OCULAR    PAST SURGICAL HISTORY: Past Surgical History:  Procedure Laterality Date  . CYSTOSCOPY/URETEROSCOPY/HOLMIUM LASER/STENT PLACEMENT Right 07/27/2018   Procedure: CYSTOSCOPY/URETEROSCOPY/HOLMIUM LASER/STENT PLACEMENT;  Surgeon: Abbie Sons, MD;  Location: [facility] ORS;  Service: Urology;  Laterality: Right;  . URETEROLITHOTOMY  11/2001  . US ECHOCARDIOGRAPHY  11/2004   normal, Carotids normal    FAMILY HISTORY: Family History  Problem Relation Age of Onset  . Diabetes Father   . Heart disease Father   . Dementia Father   . Dementia Paternal Grandmother   . CAD Neg Hx   . Hypertension Neg Hx   . Cancer Neg Hx     SOCIAL HISTORY:  Social History   Socioeconomic History  . Marital status: Married    Spouse name: Not on file  . Number of children: Not on file  . Years of education: Not on file  . Highest education level: Not on file  Occupational History  . 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 Smoking status: Never Smoker  . Smokeless tobacco: Never Used  Vaping Use  . Vaping Use: Never used  Substance and Sexual Activity  . Alcohol use: Yes    Alcohol/week: 0.0 standard drinks    Comment: rare- once a month  . Drug use: No  . Sexual  activity: Not on file  Other Topics Concern  . Not on file  Social History Narrative   Lives   Caffeine use:    Long standing relationship with partner--finally able to marry   Social Determinants of Health   Financial Resource Strain:   . Difficulty of Paying Living Expenses:   Food Insecurity:   . Worried About Charity fundraiser in the Last Year:   . Arboriculturist in the Last Year:   Transportation Needs:   . Film/video editor (Medical):   Marland Kitchen Lack of Transportation (Non-Medical):   Physical Activity:   . Days of Exercise per Week:   . Minutes of Exercise per Session:   Stress:   . Feeling of Stress :   Social Connections:   . Frequency of Communication with Friends and Family:   . Frequency of Social Gatherings with Friends and Family:   . Attends Religious Services:   . Active Member of Clubs or Organizations:   .  Attends Archivist Meetings:   Marland Kitchen Marital Status:   Intimate Partner Violence:   . Fear of Current or Ex-Partner:   . Emotionally Abused:   Marland Kitchen Physically Abused:   . Sexually Abused:      PHYSICAL EXAM  Vitals:   10/09/19 0834  Height: 5\' 11"  (1.803 m)    Body mass index is 24.13 kg/m.   General: The patient is well-developed and well-nourished and in no acute distress  HEENT:  Head is [place]/AT.  Sclera are anicteric.  Funduscopic exam shows normal optic discs and retinal vessels.  Neck: No carotid bruits are noted.  The neck is nontender.  Cardiovascular: The heart has a regular rate and rhythm with a normal S1 and S2. There were no murmurs, gallops or rubs.    Skin: Extremities are without rash or  edema.  Musculoskeletal:  Back is nontender  Neurologic Exam  Mental status: The patient is alert and oriented x 3 at the time of the examination. The patient has apparent normal recent and remote memory, with an apparently normal attention span and concentration ability.   Speech is normal.  Cranial nerves: Extraocular movements  are full. Pupils are equal, round, and reactive to light and accomodation.  Visual fields are full.  Facial symmetry is present. There is good facial sensation to soft touch bilaterally.Facial strength is normal.  Trapezius and sternocleidomastoid strength is normal. No dysarthria is noted.  The tongue is midline, and the patient has symmetric elevation of the soft palate. No obvious hearing deficits are noted.  Motor:  Muscle bulk is normal.   Tone is normal. Strength is  5 / 5 in all 4 extremities.   Sensory: Sensory testing is intact to pinprick, soft touch and vibration sensation in all 4 extremities.  Coordination: Cerebellar testing reveals good finger-nose-finger and heel-to-shin bilaterally.  Gait and station: Station is normal.   Gait is normal. However with a longer distance and speed he has a slight left foot drop.  Tandem gait is very slightly wide. Romberg is negative.   Reflexes: Deep tendon reflexes are symmetric and normal in arms but increased at knees, left > right (with spread) and ankles (no clonus).   Plantar responses are flexor.    DIAGNOSTIC DATA (LABS, IMAGING, TESTING) - I reviewed patient records, labs, notes, testing and imaging myself where available.  Lab Results  Component Value Date   WBC 5.8 04/29/2019   HGB 15.3 04/29/2019   HCT 44.6 04/29/2019   MCV 91.3 04/29/2019   PLT 253.0 04/29/2019      Component Value Date/Time   NA 138 04/29/2019 1224   K 4.6 04/29/2019 1224   CL 104 04/29/2019 1224   CO2 29 04/29/2019 1224   GLUCOSE 72 04/29/2019 1224   BUN 11 04/29/2019 1224   CREATININE 0.98 04/29/2019 1224   CREATININE 1.13 11/21/2016 1224   CALCIUM 9.3 04/29/2019 1224   PROT 7.2 04/29/2019 1224   ALBUMIN 4.3 04/29/2019 1224   AST 16 04/29/2019 1224   ALT 10 04/29/2019 1224   ALKPHOS 66 04/29/2019 1224   BILITOT 0.5 04/29/2019 1224   GFRNONAA 56 (L) 07/24/2018 0723   GFRAA >60 07/24/2018 0723   Lab Results  Component Value Date   CHOL 248  (H) 11/21/2016   HDL 43 11/21/2016   LDLCALC 155 (H) 11/21/2016   LDLDIRECT 136.0 10/21/2014   TRIG 249 (H) 11/21/2016   CHOLHDL 5.8 (H) 11/21/2016   No results found for: HGBA1C Lab Results  Component Value Date   VITAMINB12 430 09/19/2012   Lab Results  Component Value Date   TSH 5.45 (H) 10/16/2013       ASSESSMENT AND PLAN  Ocular migraine - Plan: MR BRAIN W WO CONTRAST  Balance disorder - Plan: MR BRAIN W WO CONTRAST, MR CERVICAL SPINE WO CONTRAST, MR THORACIC SPINE WO CONTRAST  White matter abnormality on MRI of brain - Plan: MR BRAIN W WO CONTRAST, MR CERVICAL SPINE WO CONTRAST, MR THORACIC SPINE WO CONTRAST  Left foot drop - Plan: MR CERVICAL SPINE WO CONTRAST, MR THORACIC SPINE WO CONTRAST  Vision disturbance - Plan: MR BRAIN W WO CONTRAST   In summary, Mr. Pandya is a 64 year old man who has episodes of visual changes, often triggered by fluorescent light or light pattern within the trees, associated with headache and fatigue.  These could represent migraine variants.  Because the episodes are self-limiting, usually lasting hours at a time, they are unlikely to be related to the findings on the MRI.  We had a very long discussion about the possibility of multiple sclerosis.  He has about 20 T2/FLAIR hyperintense foci on the MRI.  I compared the MRI between 2007 2014 and during that interval 2 or 3 more lesions developed.  Of note, a few of the lesions are clearly periventricular and a few are in the juxtacortical white matter.  This distribution can be seen with MS.  Additionally, his first symptom in 2007 with eye movement abnormalities lasting about 6 weeks, could be due to a demyelinating plaque.  Additionally, he has a left foot drop.  The mild, this could also be due to demyelination.  Because of this we need to check an MRI of the brain and cervical spine and thoracic spine to determine if there has been change over time consistent with MS and to better explain the  foot drop and other symptoms..  I discussed with him that if we do see a spinal cord plaque or if we see significant changes over time in a pattern consistent with MS, that that would support the diagnosis and we would consider a disease modifying therapy.  If he does have MS, he is fortunate to be in a nonaggressive group and will likely continue to do well, though could be better on a disease modifying therapy.    I also started imipramine to see if that helps the migraine equivalents.  Because he has a history of kidney stones, he is not a candidate for Topamax.  We will call him after the MRIs are done with the results and if they are consistent with MS I will bring him back sooner.  Otherwise, he will return in about 4 months.  I also advised him to call after about 6 weeks if he gets no benefit on the imipramine.  If that occurs, we will try different medication. Thank you for asking me to see Mr. Schwan.  Please let me know if I can be of further assistance with him or the patients in the future.   Glorianne Proctor A. Felecia Shelling, MD, [facility] 0/12/2328, 0:76 AM Certified in Neurology, Clinical Neurophysiology, Sleep Medicine and Neuroimaging  [facility] [address], [place],  [address] [phone]

## 2019-10-14 ENCOUNTER — Telehealth: Payer: Self-pay | Admitting: Neurology

## 2019-10-14 NOTE — Telephone Encounter (Signed)
LVM for pt to call back about scheduling mri  BCBS Auth: [id number] (exp. 10/10/19 to 04/06/20)

## 2019-10-14 NOTE — Telephone Encounter (Signed)
Patient returned my call he is scheduled at [facility] for 10/15/19.

## 2019-10-15 ENCOUNTER — Ambulatory Visit: Payer: BC Managed Care – PPO

## 2019-10-15 ENCOUNTER — Other Ambulatory Visit: Payer: Self-pay

## 2019-10-15 ENCOUNTER — Telehealth: Payer: Self-pay | Admitting: Neurology

## 2019-10-15 DIAGNOSIS — H539 Unspecified visual disturbance: Secondary | ICD-10-CM

## 2019-10-15 DIAGNOSIS — R2689 Other abnormalities of gait and mobility: Secondary | ICD-10-CM

## 2019-10-15 DIAGNOSIS — R9082 White matter disease, unspecified: Secondary | ICD-10-CM | POA: Diagnosis not present

## 2019-10-15 DIAGNOSIS — M21372 Foot drop, left foot: Secondary | ICD-10-CM | POA: Diagnosis not present

## 2019-10-15 DIAGNOSIS — G43109 Migraine with aura, not intractable, without status migrainosus: Secondary | ICD-10-CM

## 2019-10-15 MED ORDER — GADOBENATE DIMEGLUMINE 529 MG/ML IV SOLN
15.0000 mL | Freq: Once | INTRAVENOUS | Status: AC | PRN
  Administered 2019-10-15: 15 mL via INTRAVENOUS

## 2019-10-15 NOTE — Telephone Encounter (Signed)
I was able to connect with the patient on the phone number listed and went over the scan results in some detail and also relayed Dr. Garth Bigness assessment as documented by him. Patient was appreciative of the call and states, he is in a way relieved, that there may be an explanation of his symptoms, which dates back to perhaps 2007 when he started having visual symptoms including disconjugate gaze.  Intermittently, he has had visual symptoms but also balance issues.  He is agreeable to making an appointment for follow-up. Megan: please call to arrange for an appointment with Dr. Felecia Shelling, I would assume that patient will need one of the new patient slots for MS evaluation to discuss testing and medication options.

## 2019-10-15 NOTE — Telephone Encounter (Signed)
I called to discuss the results of the MRI and got his voicemail.  I left a message without much details.  MRI of the brain showed that he does have a couple new lesions compared to 2014.  The pattern is very consistent with MS.  MRI of the cervical spine does show 2 plaques in the spinal cord that are consistent with MS.  The combination of the new lesions in the brain as well as the presence of 2 spots in the spinal cord consistent with MS can make [place] more sure of the diagnosis.  Therefore, I would like to start a disease modifying therapy.  We will see if we can get him in sometime over the next couple weeks.

## 2019-10-15 NOTE — Telephone Encounter (Signed)
My chart message sent asking pt if 11/04/2019 at 130 pm would work for f/u appointment.

## 2019-10-15 NOTE — Telephone Encounter (Signed)
Pt has called back in response to a message he received from Dr Felecia Shelling, please call pt to relay information Dr Felecia Shelling called to discuss

## 2019-10-16 NOTE — Telephone Encounter (Signed)
I called the pt. Pt reports since starting the Imipramine 25 mg he has been suffering from daily h/a. Pt wanted to know if he should continue this med or if he should change meds to better treat his migraines/h/a. I advised pt Dr. Felecia Shelling is out of the office until 10/21/2019 but I would fwd message to work in MD to review during his absence. Pt was agreeable and appreciative.   I was able to schedule f/u appt for 11/04/2019 at 130 pm.

## 2019-10-16 NOTE — Telephone Encounter (Signed)
Chart reviewed, patient was seen by Dr. Felecia Shelling for abnormal MRI scans, repeat MRI of the brain and cervical spine confirmed the diagnosis of multiple sclerosis,  He was given imipramine 25 mg every night as migraine prevention  I am not convinced that his current worsening headache is due to imipramine use,  The choices are: 1  Add on Imitrex 25 mg as needed for headache abortive treatment  2.  Stay on current imipramine, or change to  Effexor XR 37.5 mg daily as migraine prevention

## 2019-10-17 MED ORDER — SUMATRIPTAN SUCCINATE 100 MG PO TABS: 100.0000 mg | ORAL_TABLET | Freq: Once | ORAL | 2 refills | Status: DC

## 2019-10-17 NOTE — Addendum Note (Signed)
Addended by: Verlin Grills on: 10/17/2019 10:02 AM   Modules accepted: Orders

## 2019-10-17 NOTE — Telephone Encounter (Signed)
Pt called back in and we discussed Dr. Rhea Belton recommendations.  Pt is agreeable to trying Imitrex and verbalized understanding on dosage instructions.

## 2019-10-17 NOTE — Telephone Encounter (Signed)
I called pt. No answer, left a message asking pt to call me back.   

## 2019-10-23 ENCOUNTER — Other Ambulatory Visit: Payer: Self-pay | Admitting: Internal Medicine

## 2019-11-04 ENCOUNTER — Ambulatory Visit: Payer: BC Managed Care – PPO | Admitting: Neurology

## 2019-11-04 ENCOUNTER — Encounter: Payer: Self-pay | Admitting: Neurology

## 2019-11-04 ENCOUNTER — Other Ambulatory Visit: Payer: Self-pay

## 2019-11-04 VITALS — BP 117/80 | HR 70 | Ht 71.0 in | Wt 176.0 lb

## 2019-11-04 DIAGNOSIS — G35 Multiple sclerosis: Secondary | ICD-10-CM

## 2019-11-04 DIAGNOSIS — M21372 Foot drop, left foot: Secondary | ICD-10-CM

## 2019-11-04 DIAGNOSIS — R2689 Other abnormalities of gait and mobility: Secondary | ICD-10-CM

## 2019-11-04 DIAGNOSIS — H539 Unspecified visual disturbance: Secondary | ICD-10-CM | POA: Insufficient documentation

## 2019-11-04 DIAGNOSIS — Z79899 Other long term (current) drug therapy: Secondary | ICD-10-CM | POA: Diagnosis not present

## 2019-11-04 DIAGNOSIS — E559 Vitamin D deficiency, unspecified: Secondary | ICD-10-CM | POA: Diagnosis not present

## 2019-11-04 NOTE — Progress Notes (Signed)
[facility]  PATIENT: Cameron [name] DOB: 01-Aug-1955  REFERRING DOCTOR OR PCP: Viviana Simpler MD SOURCE: Patient, notes from primary care, imaging and laboratory reports, MRI images personally reviewed.  _________________________________   HISTORICAL  CHIEF COMPLAINT:  Chief Complaint  Patient presents with  . New Patient (Initial Visit)    RM 13, with spouse. Last seen 10/09/2019.     HISTORY OF PRESENT ILLNESS:  Cameron [name] is a 64 y.o. man with relapsing remitting MS diagnosed 09/2019 but likely present since 2007 when he had ON.    MRI of the brain 10/15/2019 showed scattered T2/FLAIR hyperintense foci, some in the periventricular white matter radially oriented to the ventricles and a couple in the juxtacortical white matter..  Compared to the MRI from 2014, there were 3 or 4 new lesions.  None of the foci enhance.  MRI of the cervical and thoracic spine 10/15/2019 showed 2 T2 hyperintense foci within the spinal cord, 1 laterally to the left at C4 and the other posteriorly to the left adjacent to C6-C7.  There are degenerative changes at C4-C5 and C5-C6 with potential for left C5 nerve root compression.  There is mild spinal stenosis.  The thoracic spine showed a normal spinal cord and minimal degenerative changes at a couple levels that did not lead to nerve root compression or spinal stenosis.  Currently, his gait is fine most of the time (when not having a spell).    He always feels his right side is stronger than his left and he can balance better when stanfding on his right foot --- all of his adult life.     However, since the 90's he was noted to occasioanl drag the left foot and this happens more now and leads to stumbles and had one fall due to this.  No current visual issues.   He notes some urinary urgency but no incontinence.      He also has had incomplete emptying and reduced stream.    He has episodes where he feels word-finding is difficult,  especially if in fluorescent lights or there are a lot of noises around.   He sleeps well and feels refreshed in the mornings but drained later in the afternoon.    He has had some depression  He was having headaches.   He took imipramine and had constipation and felt more depressed.   He feels headaches are a little better now off any treatment.   They occur more with lights flashing/fluorescnt, light shining through trees.    MS History:      He began to experience some neurologic symptoms in 2007.  That year, he had the onset of difficulties with eye movements.  Specifically, he was having difficulty tracking smoothly.   He also had some stumbling.  Over 2 monhs, his symptoms improved.   In 2013, he fell due to a left foot drop and continues to have mild left leg weakness. Since then he also has noted that he feels disoriented, has trouble coming up with the right words and veers to the left intermittently, especially in bright fluorescent lights as in some stores.  He also has had left hand numbness and reduced coordination for the last year.   He was diagnosed 11/05/2019 based on his history of optic neuritis, relapses with left-sided symptoms and additional changes in brain MRI and the presence of 2 foci in the cervical spine  Imaging studies: MRIs of the brain from 10/11/2005 and 10/29/2012:  The MRI show multiple T2/FLAIR hyperintense foci, at least 20, with some in the periventricular white matter and some in the juxtacortical white matter.  There was U-fiber involvement of a couple foci in the posterior left frontal lobe.  Between 2007 and 2014, 2-3 more lesions developed (the new lesions were mostly deep white matter).  We do not have any images of the cervical or thoracic spine spine so the spinal cord could not be reviewed.  MRI of the brain 10/15/2019 showed scattered T2/FLAIR hyperintense foci, some in the periventricular white matter radially oriented to the ventricles and a couple in the  juxtacortical white matter..  Compared to the MRI from 2014, there were 3 or 4 new lesions.  None of the foci enhance.  MRI of the cervical and thoracic spine 10/15/2019 showed 2 T2 hyperintense foci within the spinal cord, 1 laterally to the left at C4 and the other posteriorly to the left adjacent to C6-C7.  There are degenerative changes at C4-C5 and C5-C6 with potential for left C5 nerve root compression.  There is mild spinal stenosis.  The thoracic spine showed a normal spinal cord and minimal degenerative changes at a couple levels that did not lead to nerve root compression or spinal stenosis.  REVIEW OF SYSTEMS: Constitutional: No fevers, chills, sweats, or change in appetite Eyes: As above Ear, nose and throat: No hearing loss, ear pain, nasal congestion, sore throat Cardiovascular: No chest pain, palpitations Respiratory: No shortness of breath at rest or with exertion.   No wheezes GastrointestinaI: No nausea, vomiting, diarrhea, abdominal pain, fecal incontinence Genitourinary: No dysuria, urinary retention.  He has mild frequency and nocturia.  He has a history of kidney stones. Musculoskeletal: No neck pain, back pain Integumentary: No rash, pruritus, skin lesions Neurological: as above Psychiatric: No depression at this time.  No anxiety Endocrine: No palpitations, diaphoresis, change in appetite, change in weigh or increased thirst Hematologic/Lymphatic: No anemia, purpura, petechiae. Allergic/Immunologic: No itchy/runny eyes, nasal congestion, recent allergic reactions, rashes  ALLERGIES: Allergies  Allergen Reactions  . Citalopram Hydrobromide     REACTION: unspecified  . Penicillins Other (See Comments)    REACTION: questionable REACTION: questionable    HOME MEDICATIONS:  Current Outpatient Medications:  .  esomeprazole (NEXIUM) 20 MG capsule, Take 20 mg by mouth as needed. , Disp: , Rfl:  .  fluticasone (FLONASE) 50 MCG/ACT nasal spray, Place 2 sprays into  both nostrils daily. In each nostril, Disp: 16 g, Rfl: 12 .  hydrocortisone (ANUSOL-HC) 25 MG suppository, UNWRAP AND INSERT 1 SUPPOSITORY INTO THE RECTUM 3 TIMES DAILY AS NEEDED, Disp: 30 suppository, Rfl: 1 .  ibuprofen (ADVIL,MOTRIN) 200 MG tablet, Take 200-400 mg by mouth every 6 (six) hours as needed., Disp: , Rfl:   PAST MEDICAL HISTORY: Past Medical History:  Diagnosis Date  . Abnormal MRI 2007   nonspecific white matter abnormalities  . Allergy   . Anxiety   . GERD (gastroesophageal reflux disease)   . History of kidney stones   . Hyperlipidemia   . Migraines    OCULAR    PAST SURGICAL HISTORY: Past Surgical History:  Procedure Laterality Date  . CYSTOSCOPY/URETEROSCOPY/HOLMIUM LASER/STENT PLACEMENT Right 07/27/2018   Procedure: CYSTOSCOPY/URETEROSCOPY/HOLMIUM LASER/STENT PLACEMENT;  Surgeon: Abbie Sons, MD;  Location: [facility] ORS;  Service: Urology;  Laterality: Right;  . URETEROLITHOTOMY  11/2001  . US ECHOCARDIOGRAPHY  11/2004   normal, Carotids normal    FAMILY HISTORY: Family History  Problem Relation Age of Onset  .  Diabetes Father   . Heart disease Father   . Dementia Father   . Dementia Paternal Grandmother   . CAD Neg Hx   . Hypertension Neg Hx   . Cancer Neg Hx     SOCIAL HISTORY:  Social History   Socioeconomic History  . Marital status: Married    Spouse name: Not on file  . Number of children: Not on file  . Years of education: Not on file  . Highest education level: Not on file  Occupational History  . 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 Smoking status: Never Smoker  . Smokeless tobacco: Never Used  Vaping Use  . Vaping Use: Never used  Substance and Sexual Activity  . Alcohol use: Yes    Alcohol/week: 0.0 standard drinks    Comment: rare- once a month  . Drug use: No  . Sexual activity: Not on file  Other Topics Concern  . Not on file  Social History Narrative    Lives   Caffeine use:    Long standing relationship with partner--finally able to marry   Social Determinants of Health   Financial Resource Strain:   . Difficulty of Paying Living Expenses:   Food Insecurity:   . Worried About Charity fundraiser in the Last Year:   . Arboriculturist in the Last Year:   Transportation Needs:   . Film/video editor (Medical):   Marland Kitchen Lack of Transportation (Non-Medical):   Physical Activity:   . Days of Exercise per Week:   . Minutes of Exercise per Session:   Stress:   . Feeling of Stress :   Social Connections:   . Frequency of Communication with Friends and Family:   . Frequency of Social Gatherings with Friends and Family:   . Attends Religious Services:   . Active Member of Clubs or Organizations:   . Attends Archivist Meetings:   Marland Kitchen Marital Status:   Intimate Partner Violence:   . Fear of Current or Ex-Partner:   . Emotionally Abused:   Marland Kitchen Physically Abused:   . Sexually Abused:      PHYSICAL EXAM  Vitals:   11/04/19 1323  BP: 117/80  Pulse: 70  Weight: 176 lb (79.8 kg)  Height: 5\' 11"  (1.803 m)    Body mass index is 24.55 kg/m.   General: The patient is well-developed and well-nourished and in no acute distress  Neck:  The neck is nontender.  Skin: Extremities are without rash or  edema.  Neurologic Exam  Mental status: The patient is alert and oriented x 3 at the time of the examination. The patient has apparent normal recent and remote memory, with an apparently normal attention span and concentration ability.   Speech is normal.  Cranial nerves: Extraocular movements are full. Pupils are equal, round, and reactive to light and accomodation.  Color vision is symmetric.  Facial symmetry is present. There is good facial sensation to soft touch bilaterally.Facial strength is normal.  Trapezius and sternocleidomastoid strength is normal. No dysarthria is noted.No obvious hearing deficits are noted.  Motor:   Muscle bulk is normal.   Tone is normal. Strength is  5 / 5 in all 4 extremities.   Sensory: Sensory testing is intact to pinprick, soft touch and vibration sensation in all 4 extremities.  Coordination: Cerebellar testing reveals good finger-nose-finger and heel-to-shin bilaterally.  Gait and station: Station is normal.  Gait is normal. However with a longer distance and speed he has a slight left foot drop.  Tandem gait is slightly wide. Romberg is negative.   Reflexes: Deep tendon reflexes are symmetric and normal in arms but increased at knees, left > right (with spread) and ankles (no clonus).   Plantar responses are flexor.    DIAGNOSTIC DATA (LABS, IMAGING, TESTING) - I reviewed patient records, labs, notes, testing and imaging myself where available.  Lab Results  Component Value Date   WBC 5.8 04/29/2019   HGB 15.3 04/29/2019   HCT 44.6 04/29/2019   MCV 91.3 04/29/2019   PLT 253.0 04/29/2019      Component Value Date/Time   NA 138 04/29/2019 1224   K 4.6 04/29/2019 1224   CL 104 04/29/2019 1224   CO2 29 04/29/2019 1224   GLUCOSE 72 04/29/2019 1224   BUN 11 04/29/2019 1224   CREATININE 0.98 04/29/2019 1224   CREATININE 1.13 11/21/2016 1224   CALCIUM 9.3 04/29/2019 1224   PROT 7.2 04/29/2019 1224   ALBUMIN 4.3 04/29/2019 1224   AST 16 04/29/2019 1224   ALT 10 04/29/2019 1224   ALKPHOS 66 04/29/2019 1224   BILITOT 0.5 04/29/2019 1224   GFRNONAA 56 (L) 07/24/2018 0723   GFRAA >60 07/24/2018 0723   Lab Results  Component Value Date   CHOL 248 (H) 11/21/2016   HDL 43 11/21/2016   LDLCALC 155 (H) 11/21/2016   LDLDIRECT 136.0 10/21/2014   TRIG 249 (H) 11/21/2016   CHOLHDL 5.8 (H) 11/21/2016   No results found for: HGBA1C Lab Results  Component Value Date   VITAMINB12 430 09/19/2012   Lab Results  Component Value Date   TSH 5.45 (H) 10/16/2013       ASSESSMENT AND PLAN  Multiple sclerosis ([place]) - Plan: QuantiFERON-TB Gold Plus, Comprehensive  metabolic panel, CBC with Differential/Platelet  High risk medication use - Plan: QuantiFERON-TB Gold Plus, Comprehensive metabolic panel, CBC with Differential/Platelet  Vitamin D deficiency - Plan: VITAMIN D 25 Hydroxy (Vit-D Deficiency, Fractures)  1.  Mr. Spira meets the McDonald criteria for MS based on his symptoms with objective findings on exam and abnormal MRI of the brain and cervical spine with progression in the brain.  I discussed with him that he does not appear to have a highly aggressive MS but that I do recommend treatment based on definite changes in the MRI over time and his more recent left sided symptoms that are likely due to the plaques in the spinal cord.  However, I would recommend treatment with a disease modifying drug. 2.   I discussed some of the possible treatments for his MS.  As MS does not appear to be aggressive is reasonable to consider one of the oral agents.  After discussion of the several of the options he would like to start Aubagio.   3.   We will check blood work including CBC, CMP and QuantiFERON TB. 4.   Additionally I will check vitamin D supplement as needed 5.  Stay active and exercise as tolerated 6.   Return to see me in 3 months or sooner if there are new or worsening neurologic symptoms.  45-minute office visit with the majority of the time spent face-to-face for history and physical, discussion/counseling and decision-making.  Additional time with record review and documentation.   Trevaris Pennella A. Felecia Shelling, MD, [facility] 08/24/3265, 1:24 PM Certified in Neurology, Clinical Neurophysiology, Sleep Medicine and Neuroimaging  [facility] [address], [address]  [place], [place] [address] [phone]

## 2019-11-06 LAB — COMPREHENSIVE METABOLIC PANEL
ALT: 18 IU/L (ref 0–44)
AST: 25 IU/L (ref 0–40)
Albumin/Globulin Ratio: 1.6 (ref 1.2–2.2)
Albumin: 4.4 g/dL (ref 3.8–4.8)
Alkaline Phosphatase: 83 IU/L (ref 48–121)
BUN/Creatinine Ratio: 10 (ref 10–24)
BUN: 10 mg/dL (ref 8–27)
Bilirubin Total: 0.5 mg/dL (ref 0.0–1.2)
CO2: 23 mmol/L (ref 20–29)
Calcium: 9.4 mg/dL (ref 8.6–10.2)
Chloride: 100 mmol/L (ref 96–106)
Creatinine, Ser: 0.97 mg/dL (ref 0.76–1.27)
GFR calc Af Amer: 96 mL/min/{1.73_m2} (ref >59)
GFR calc non Af Amer: 83 mL/min/{1.73_m2} (ref >59)
Globulin, Total: 2.8 g/dL (ref 1.5–4.5)
Glucose: 84 mg/dL (ref 65–99)
Potassium: 4.7 mmol/L (ref 3.5–5.2)
Sodium: 136 mmol/L (ref 134–144)
Total Protein: 7.2 g/dL (ref 6.0–8.5)

## 2019-11-06 LAB — CBC WITH DIFFERENTIAL/PLATELET
Basophils Absolute: 0.1 10*3/uL (ref 0.0–0.2)
Basos: 1 %
EOS (ABSOLUTE): 0.1 10*3/uL (ref 0.0–0.4)
Eos: 1 %
Hematocrit: 42.9 % (ref 37.5–51.0)
Hemoglobin: 15.2 g/dL (ref 13.0–17.7)
Immature Grans (Abs): 0 10*3/uL (ref 0.0–0.1)
Immature Granulocytes: 0 %
Lymphocytes Absolute: 2.5 10*3/uL (ref 0.7–3.1)
Lymphs: 42 %
MCH: 31.1 pg (ref 26.6–33.0)
MCHC: 35.4 g/dL (ref 31.5–35.7)
MCV: 88 fL (ref 79–97)
Monocytes Absolute: 0.5 10*3/uL (ref 0.1–0.9)
Monocytes: 9 %
Neutrophils Absolute: 2.8 10*3/uL (ref 1.4–7.0)
Neutrophils: 47 %
Platelets: 253 10*3/uL (ref 150–450)
RBC: 4.89 x10E6/uL (ref 4.14–5.80)
RDW: 12.3 % (ref 11.6–15.4)
WBC: 6 10*3/uL (ref 3.4–10.8)

## 2019-11-06 LAB — QUANTIFERON-TB GOLD PLUS
QuantiFERON Mitogen Value: >10 IU/mL
QuantiFERON Nil Value: 0 IU/mL
QuantiFERON TB1 Ag Value: 0.03 IU/mL
QuantiFERON TB2 Ag Value: 0.03 IU/mL
QuantiFERON-TB Gold Plus: NEGATIVE

## 2019-11-06 LAB — VITAMIN D 25 HYDROXY (VIT D DEFICIENCY, FRACTURES): Vit D, 25-Hydroxy: 23 ng/mL — ABNORMAL LOW (ref 30.0–100.0)

## 2019-11-07 ENCOUNTER — Telehealth: Payer: Self-pay | Admitting: *Deleted

## 2019-11-07 DIAGNOSIS — R7989 Other specified abnormal findings of blood chemistry: Secondary | ICD-10-CM

## 2019-11-07 MED ORDER — VITAMIN D (ERGOCALCIFEROL) 1.25 MG (50000 UNIT) PO CAPS: ORAL_CAPSULE | ORAL | 1 refills | Status: DC

## 2019-11-07 NOTE — Telephone Encounter (Signed)
Called and LVM that Dr. Nathaneil Canary reviewed his lab work and said everything looks ok to get him initiated on Aubagio. Advised we will send in the start form and insurance may require a PA, which we will work on. Advised [facility] may reach out to him to try and get him set up on med as well, that they may call from an 800 number. Also relayed that his Vit D was low and Dr. Felecia Shelling wants to start him on a prescription strength Vit D of 50000U once a week for 24 weeks. I will send this into his pharmacy. Advised him to call back if he has any further questions/concerns.  Faxed completed/signed Aubagio 14mg  start form to [employer] one at [phone]. Received fax confirmation.

## 2019-11-11 NOTE — Telephone Encounter (Signed)
Completed PA via covermymeds and sent to [facility]. Key: [id number]. Should have a determination in 3 days.

## 2019-11-11 NOTE — Telephone Encounter (Signed)
Completed PA for aubagio via covermymeds. Sent to [employer]. Should have a determination in 3 business days. Key: [id number]

## 2019-11-11 NOTE — Telephone Encounter (Signed)
Received this notification from [place]: "Recvd request (11/11/2019 1:31:24 PM) provider requesting prior auth for rx (Aubagio 14MG  OR TABS) on a state ppo mbr...faxed provider back at on (11/11/2019....3:35PM) and advised:? Please note this patient has a State PPO policy (YPYW prefix), all pharmacy inquiries are handled directly through [facility] @ ph# [phone].Marland KitchenMarland Kitchen"

## 2019-11-12 NOTE — Telephone Encounter (Signed)
Received approval of coverage of aubagio from [facility] effective 11/12/2019-11/11/2020. PA# [place] [phone].

## 2019-11-14 ENCOUNTER — Ambulatory Visit: Payer: Self-pay | Admitting: Neurology

## 2019-11-14 ENCOUNTER — Other Ambulatory Visit: Payer: Self-pay

## 2019-11-14 DIAGNOSIS — Z79899 Other long term (current) drug therapy: Secondary | ICD-10-CM

## 2019-12-12 ENCOUNTER — Other Ambulatory Visit: Payer: Self-pay

## 2019-12-12 ENCOUNTER — Other Ambulatory Visit (INDEPENDENT_AMBULATORY_CARE_PROVIDER_SITE_OTHER): Payer: Self-pay

## 2019-12-12 DIAGNOSIS — Z79899 Other long term (current) drug therapy: Secondary | ICD-10-CM

## 2019-12-12 DIAGNOSIS — Z0289 Encounter for other administrative examinations: Secondary | ICD-10-CM

## 2019-12-13 LAB — HEPATIC FUNCTION PANEL
ALT: 33 IU/L (ref 0–44)
AST: 24 IU/L (ref 0–40)
Albumin: 4.4 g/dL (ref 3.8–4.8)
Alkaline Phosphatase: 85 IU/L (ref 48–121)
Bilirubin Total: 0.5 mg/dL (ref 0.0–1.2)
Bilirubin, Direct: 0.13 mg/dL (ref 0.00–0.40)
Total Protein: 7.1 g/dL (ref 6.0–8.5)

## 2019-12-16 ENCOUNTER — Telehealth: Payer: Self-pay | Admitting: *Deleted

## 2019-12-16 ENCOUNTER — Telehealth: Payer: Self-pay | Admitting: Neurology

## 2019-12-16 MED ORDER — MODAFINIL 200 MG PO TABS: 200.0000 mg | ORAL_TABLET | Freq: Every day | ORAL | 5 refills | Status: DC

## 2019-12-16 NOTE — Telephone Encounter (Signed)
Called and spoke with pt. Having severe fatigue since starting Aubagio about a month ago. He is wanting to know if there is any treatment Dr. Felecia Shelling would recommend for fatigue. Fatigue worsens around 2:30pm and worse than his normal fatigue he has reported in the past. He denies any signs/symptoms of infection. Advised I will discuss with MD and call back.  [id number] [place], [place]

## 2019-12-16 NOTE — Telephone Encounter (Addendum)
Spoke with Dr. Felecia Shelling. He recommends Modafinil 200mg  po qd #30, 5 refills. I called pt and relayed this. He is agreeable to this. Aware it may require PA via insurance. If so, we will work on this.

## 2019-12-16 NOTE — Telephone Encounter (Signed)
Patient left a voicemail stating he was diagnosed with MS about a month ago. Patient stated that he is on Aubagio. Patient stated that he is seeing Dr. Felecia Shelling neurologist. Patient stated that he is having a lot of fatigue. Patient stated that he wanted Dr. Silvio Pate to be aware of this.

## 2019-12-16 NOTE — Addendum Note (Signed)
Addended by: Britt Bottom on: 12/16/2019 05:12 PM   Modules accepted: Orders

## 2019-12-16 NOTE — Telephone Encounter (Signed)
Pt reporting feeling fatigue. Pt ask if there is anything he can do about it to be able to continue to do his job.

## 2019-12-16 NOTE — Addendum Note (Signed)
Addended by: Wyvonnia Lora on: 12/16/2019 04:15 PM   Modules accepted: Orders

## 2019-12-16 NOTE — Telephone Encounter (Signed)
Submitted PA modafinil on CMM. Key: [id number]. PA Case ID: [id number] - Rx #: [id number] Waiting on determination [employer].

## 2019-12-17 NOTE — Telephone Encounter (Signed)
LVM

## 2019-12-17 NOTE — Telephone Encounter (Signed)
He should probably check with Dr Felecia Shelling to make sure that this is not related to the new medication. If not, may need visit for evaluation

## 2019-12-17 NOTE — Telephone Encounter (Signed)
PA approved via [facility] effective 12/17/2019 - 12/16/2020. PA# Rosalia [phone] Non-Grandfathered [phone]

## 2019-12-18 NOTE — Telephone Encounter (Signed)
Pt has called to check on the status of the PA on his modafinil, he was made aware of the entry from [place], [place] on yesterday.  [facility] morning and is being told they have no record of the update.  Please call.

## 2019-12-18 NOTE — Telephone Encounter (Addendum)
[facility] [phone]. Spoke with [place]. They are getting paid claim for modafinil. They will get medication ready for pt to pick up. I called pt back and provided update. He verbalized understanding and appreciation.

## 2019-12-18 NOTE — Telephone Encounter (Signed)
Patient returned call. I let patient know Dr.Letvak's comments. Patient said he did contact Dr.Sater and he prescribed Monafidil 200 mg for fatigue.  Patient said he's waiting on a prior authorization to get the medication.  Patient wanted to make sure that Dr.Letvak was aware of the MRI and Labs done by Dr.Sater. I let patient know the results are in Epic.

## 2019-12-18 NOTE — Telephone Encounter (Signed)
Please let him know that Dr Felecia Shelling is a Yoe partner of mine---so I see everything that he does

## 2020-01-13 ENCOUNTER — Other Ambulatory Visit (INDEPENDENT_AMBULATORY_CARE_PROVIDER_SITE_OTHER): Payer: Self-pay

## 2020-01-13 DIAGNOSIS — Z0289 Encounter for other administrative examinations: Secondary | ICD-10-CM

## 2020-01-13 DIAGNOSIS — Z79899 Other long term (current) drug therapy: Secondary | ICD-10-CM

## 2020-01-14 ENCOUNTER — Telehealth: Payer: Self-pay | Admitting: *Deleted

## 2020-01-14 LAB — HEPATIC FUNCTION PANEL
ALT: 20 IU/L (ref 0–44)
AST: 22 IU/L (ref 0–40)
Albumin: 4.2 g/dL (ref 3.8–4.8)
Alkaline Phosphatase: 80 IU/L (ref 44–121)
Bilirubin Total: 0.3 mg/dL (ref 0.0–1.2)
Bilirubin, Direct: <0.1 mg/dL (ref 0.00–0.40)
Total Protein: 6.8 g/dL (ref 6.0–8.5)

## 2020-01-14 NOTE — Telephone Encounter (Signed)
-----   Message from Melvenia Beam, MD sent at 01/14/2020 11:59 AM EDT ----- Blood work is normal thanks

## 2020-02-10 ENCOUNTER — Other Ambulatory Visit: Payer: Self-pay | Admitting: *Deleted

## 2020-02-10 DIAGNOSIS — N201 Calculus of ureter: Secondary | ICD-10-CM

## 2020-02-12 ENCOUNTER — Other Ambulatory Visit: Payer: Self-pay

## 2020-02-12 ENCOUNTER — Other Ambulatory Visit (INDEPENDENT_AMBULATORY_CARE_PROVIDER_SITE_OTHER): Payer: Self-pay

## 2020-02-12 DIAGNOSIS — Z0289 Encounter for other administrative examinations: Secondary | ICD-10-CM

## 2020-02-12 DIAGNOSIS — Z79899 Other long term (current) drug therapy: Secondary | ICD-10-CM

## 2020-02-13 LAB — HEPATIC FUNCTION PANEL
ALT: 19 IU/L (ref 0–44)
AST: 20 IU/L (ref 0–40)
Albumin: 4.3 g/dL (ref 3.8–4.8)
Alkaline Phosphatase: 88 IU/L (ref 44–121)
Bilirubin Total: 0.5 mg/dL (ref 0.0–1.2)
Bilirubin, Direct: 0.12 mg/dL (ref 0.00–0.40)
Total Protein: 6.6 g/dL (ref 6.0–8.5)

## 2020-02-20 ENCOUNTER — Ambulatory Visit: Payer: BC Managed Care – PPO | Admitting: Neurology

## 2020-02-20 ENCOUNTER — Telehealth: Payer: Self-pay | Admitting: Neurology

## 2020-02-20 ENCOUNTER — Encounter: Payer: Self-pay | Admitting: Neurology

## 2020-02-20 VITALS — BP 105/70 | HR 73 | Ht 71.0 in | Wt 170.0 lb

## 2020-02-20 DIAGNOSIS — G35D Multiple sclerosis, unspecified: Secondary | ICD-10-CM

## 2020-02-20 DIAGNOSIS — R2689 Other abnormalities of gait and mobility: Secondary | ICD-10-CM | POA: Diagnosis not present

## 2020-02-20 DIAGNOSIS — R7989 Other specified abnormal findings of blood chemistry: Secondary | ICD-10-CM | POA: Diagnosis not present

## 2020-02-20 DIAGNOSIS — G35 Multiple sclerosis: Secondary | ICD-10-CM

## 2020-02-20 DIAGNOSIS — Z79899 Other long term (current) drug therapy: Secondary | ICD-10-CM

## 2020-02-20 DIAGNOSIS — M21372 Foot drop, left foot: Secondary | ICD-10-CM

## 2020-02-20 NOTE — Progress Notes (Signed)
[facility]  PATIENT: Cameron [name] DOB: 30-Apr-1955  REFERRING DOCTOR OR PCP: Viviana Simpler MD SOURCE: Patient, notes from primary care, imaging and laboratory reports, MRI images personally reviewed.  _________________________________   HISTORICAL  CHIEF COMPLAINT:  Chief Complaint  Patient presents with  . Follow-up    RM 12, alone. Last seen 11/04/2019. Feels covid-19 vaccine/booster has caused allergies to flare up.   . Multiple Sclerosis    On Aubagio. Feels temperature regulation has changed since starting this. Gets chilled more easily.     HISTORY OF PRESENT ILLNESS:  Cameron [name] is a 64 y.o. man with relapsing remitting MS diagnosed 09/2019 but likely present since 2007 when he had visual disturbances. Marland Kitchen    Update 02/20/2020 He has been on Aubagio for 3+ months and he tolerates it fairly well.   He did have more fatigue the first month and we started modafinil.    He feels it has helped him a lot.  He takes it at 1030 am as he has no fatigue in the early mornings.    He feels a dip in energy in the late afternoon and he feels he is able to get tasks done.     Currently, he has noted more muscle spasticity on the left which affects gait.   Hid left side has mild weakness.   He is doing more stretching to help with the spasms.   He feels the motor symptoms have slow;y progressed..  No current visual issues.   He notes some urinary urgency but no incontinence.      He also has had int. complete emptying and reduced stream.   He denies bowel issues while taking fiber plus Miralax.  He has episodes where he feels word-finding is difficult but most of the time speech is fine.   He notes doing worse especially if in fluorescent lights or there are a lot of noises around.   He sleeps well and feels refreshed in the mornings but drained later in the afternoon.    He has had some depression  He notes headaches are better.   MS History:      He began to  experience some neurologic symptoms in 2007.  That year, he had the onset of difficulties with eye movements.  Specifically, he was having difficulty tracking smoothly.   He also had some stumbling.  Over 2 monhs, his symptoms improved.   In 2013, he fell due to a left foot drop and continues to have mild left leg weakness. Since then he also has noted that he feels disoriented, has trouble coming up with the right words and veers to the left intermittently, especially in bright fluorescent lights as in some stores.  He also has had left hand numbness and reduced coordination for the last year.   He was diagnosed 11/05/2019 based on his history of optic neuritis, relapses with left-sided symptoms and additional changes in brain MRI and the presence of 2 foci in the cervical spine  Imaging studies: MRIs of the brain from 10/11/2005 and 10/29/2012:  The MRI show multiple T2/FLAIR hyperintense foci, at least 20, with some in the periventricular white matter and some in the juxtacortical white matter.  There was U-fiber involvement of a couple foci in the posterior left frontal lobe.  Between 2007 and 2014, 2-3 more lesions developed (the new lesions were mostly deep white matter).  We do not have any images of the cervical or thoracic spine spine so  the spinal cord could not be reviewed.  MRI of the brain 10/15/2019 showed scattered T2/FLAIR hyperintense foci, some in the periventricular white matter radially oriented to the ventricles and a couple in the juxtacortical white matter..  Compared to the MRI from 2014, there were 3 or 4 new lesions.  None of the foci enhance.  MRI of the cervical and thoracic spine 10/15/2019 showed 2 T2 hyperintense foci within the spinal cord, 1 laterally to the left at C4 and the other posteriorly to the left adjacent to C6-C7.  There are degenerative changes at C4-C5 and C5-C6 with potential for left C5 nerve root compression.  There is mild spinal stenosis.  The thoracic spine  showed a normal spinal cord and minimal degenerative changes at a couple levels that did not lead to nerve root compression or spinal stenosis.  REVIEW OF SYSTEMS: Constitutional: No fevers, chills, sweats, or change in appetite Eyes: As above Ear, nose and throat: No hearing loss, ear pain, nasal congestion, sore throat Cardiovascular: No chest pain, palpitations Respiratory: No shortness of breath at rest or with exertion.   No wheezes GastrointestinaI: No nausea, vomiting, diarrhea, abdominal pain, fecal incontinence Genitourinary: No dysuria, urinary retention.  He has mild frequency and nocturia.  He has a history of kidney stones. Musculoskeletal: No neck pain, back pain Integumentary: No rash, pruritus, skin lesions Neurological: as above Psychiatric: No depression at this time.  No anxiety Endocrine: No palpitations, diaphoresis, change in appetite, change in weigh or increased thirst Hematologic/Lymphatic: No anemia, purpura, petechiae. Allergic/Immunologic: No itchy/runny eyes, nasal congestion, recent allergic reactions, rashes  ALLERGIES: Allergies  Allergen Reactions  . Citalopram Hydrobromide     REACTION: unspecified  . Penicillins Other (See Comments)    REACTION: questionable REACTION: questionable    HOME MEDICATIONS:  Current Outpatient Medications:  .  esomeprazole (NEXIUM) 20 MG capsule, Take 20 mg by mouth as needed. , Disp: , Rfl:  .  hydrocortisone (ANUSOL-HC) 25 MG suppository, UNWRAP AND INSERT 1 SUPPOSITORY INTO THE RECTUM 3 TIMES DAILY AS NEEDED, Disp: 30 suppository, Rfl: 1 .  ibuprofen (ADVIL,MOTRIN) 200 MG tablet, Take 200-400 mg by mouth every 6 (six) hours as needed., Disp: , Rfl:  .  modafinil (PROVIGIL) 200 MG tablet, Take 1 tablet (200 mg total) by mouth daily., Disp: 30 tablet, Rfl: 5 .  Teriflunomide (AUBAGIO) 14 MG TABS, Take 1 tablet by mouth daily., Disp: , Rfl:  .  Vitamin D, Ergocalciferol, (DRISDOL) 1.25 MG (50000 UNIT) CAPS capsule,  Take 1 capsule by mouth once a week for 24 weeks, Disp: 12 capsule, Rfl: 1 .  fluticasone (FLONASE) 50 MCG/ACT nasal spray, Place 2 sprays into both nostrils daily. In each nostril (Patient not taking: Reported on 02/20/2020), Disp: 16 g, Rfl: 12  PAST MEDICAL HISTORY: Past Medical History:  Diagnosis Date  . Abnormal MRI 2007   nonspecific white matter abnormalities  . Allergy   . Anxiety   . GERD (gastroesophageal reflux disease)   . History of kidney stones   . Hyperlipidemia   . Migraines    OCULAR    PAST SURGICAL HISTORY: Past Surgical History:  Procedure Laterality Date  . CYSTOSCOPY/URETEROSCOPY/HOLMIUM LASER/STENT PLACEMENT Right 07/27/2018   Procedure: CYSTOSCOPY/URETEROSCOPY/HOLMIUM LASER/STENT PLACEMENT;  Surgeon: Abbie Sons, MD;  Location: [facility] ORS;  Service: Urology;  Laterality: Right;  . URETEROLITHOTOMY  11/2001  . US ECHOCARDIOGRAPHY  11/2004   normal, Carotids normal    FAMILY HISTORY: Family History  Problem Relation Age of Onset  .  Diabetes Father   . Heart disease Father   . Dementia Father   . Dementia Paternal Grandmother   . CAD Neg Hx   . Hypertension Neg Hx   . Cancer Neg Hx     SOCIAL HISTORY:  Social History   Socioeconomic History  . Marital status: Married    Spouse name: Not on file  . Number of children: Not on file  . Years of education: Not on file  . Highest education level: Not on file  Occupational History  . 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 Smoking status: Never Smoker  . Smokeless tobacco: Never Used  Vaping Use  . Vaping Use: Never used  Substance and Sexual Activity  . Alcohol use: Yes    Alcohol/week: 0.0 standard drinks    Comment: rare- once a month  . Drug use: No  . Sexual activity: Not on file  Other Topics Concern  . Not on file  Social History Narrative   Lives   Caffeine use:    Long standing relationship with partner--finally  able to marry   Social Determinants of Health   Financial Resource Strain:   . Difficulty of Paying Living Expenses: Not on file  Food Insecurity:   . Worried About Charity fundraiser in the Last Year: Not on file  . Ran Out of Food in the Last Year: Not on file  Transportation Needs:   . Lack of Transportation (Medical): Not on file  . Lack of Transportation (Non-Medical): Not on file  Physical Activity:   . Days of Exercise per Week: Not on file  . Minutes of Exercise per Session: Not on file  Stress:   . Feeling of Stress : Not on file  Social Connections:   . Frequency of Communication with Friends and Family: Not on file  . Frequency of Social Gatherings with Friends and Family: Not on file  . Attends Religious Services: Not on file  . Active Member of Clubs or Organizations: Not on file  . Attends Archivist Meetings: Not on file  . Marital Status: Not on file  Intimate Partner Violence:   . Fear of Current or Ex-Partner: Not on file  . Emotionally Abused: Not on file  . Physically Abused: Not on file  . Sexually Abused: Not on file     PHYSICAL EXAM  Vitals:   02/20/20 0822  BP: 105/70  Pulse: 73  SpO2: 96%  Weight: 170 lb (77.1 kg)  Height: 5\' 11"  (1.803 m)    Body mass index is 23.71 kg/m.   General: The patient is well-developed and well-nourished and in no acute distress  Neck:  The neck is nontender.  Skin: Extremities are without rash or  edema.  Neurologic Exam  Mental status: The patient is alert and oriented x 3 at the time of the examination. The patient has apparent normal recent and remote memory, with an apparently normal attention span and concentration ability.   Speech is normal.  Cranial nerves: Extraocular movements are full. Pupils are equal, round, and reactive to light and accomodation.  Color vision is symmetric.  Facial symmetry is present. There is good facial sensation to soft touch bilaterally.Facial strength is  normal.  Trapezius and sternocleidomastoid strength is normal. No dysarthria is noted.No obvious hearing deficits are noted.  Motor:  Muscle bulk is normal.   Tone is normal. Strength is  5 / 5 in  all 4 extremities.   Sensory: Sensory testing is intact to pinprick, soft touch and vibration sensation in all 4 extremities.  Coordination: Cerebellar testing reveals good finger-nose-finger and heel-to-shin bilaterally.  Gait and station: Station is normal.    Gait had a normal stride.  Slight left foot drop.  Tandem gait is slightly wide. Romberg is negative.   Reflexes: Deep tendon reflexes are symmetric and normal in arms but increased at knees, left > right (with spread) and ankles (no clonus).   Plantar responses are flexor.    DIAGNOSTIC DATA (LABS, IMAGING, TESTING) - I reviewed patient records, labs, notes, testing and imaging myself where available.  Lab Results  Component Value Date   WBC 6.0 11/04/2019   HGB 15.2 11/04/2019   HCT 42.9 11/04/2019   MCV 88 11/04/2019   PLT 253 11/04/2019      Component Value Date/Time   NA 136 11/04/2019 1433   K 4.7 11/04/2019 1433   CL 100 11/04/2019 1433   CO2 23 11/04/2019 1433   GLUCOSE 84 11/04/2019 1433   GLUCOSE 72 04/29/2019 1224   BUN 10 11/04/2019 1433   CREATININE 0.97 11/04/2019 1433   CREATININE 1.13 11/21/2016 1224   CALCIUM 9.4 11/04/2019 1433   PROT 6.6 02/12/2020 0802   ALBUMIN 4.3 02/12/2020 0802   AST 20 02/12/2020 0802   ALT 19 02/12/2020 0802   ALKPHOS 88 02/12/2020 0802   BILITOT 0.5 02/12/2020 0802   GFRNONAA 83 11/04/2019 1433   GFRAA 96 11/04/2019 1433   Lab Results  Component Value Date   CHOL 248 (H) 11/21/2016   HDL 43 11/21/2016   LDLCALC 155 (H) 11/21/2016   LDLDIRECT 136.0 10/21/2014   TRIG 249 (H) 11/21/2016   CHOLHDL 5.8 (H) 11/21/2016   No results found for: HGBA1C Lab Results  Component Value Date   VITAMINB12 430 09/19/2012   Lab Results  Component Value Date   TSH 5.45 (H)  10/16/2013       ASSESSMENT AND PLAN  Multiple sclerosis ([place]) - Plan: MR BRAIN W WO CONTRAST  High risk medication use  Low vitamin D level  Balance disorder  Left foot drop  1.  Continue Aubagio.   Will check monthly labs 2.  Check MRI brian in a few months to determine if any breakthrough while on Aubagio.  If occurring, we will switch to a stronger DMT     3.   Continue Vit D supps 4.   Stay active and exercise as tolerated 5.   Return to see me in 4 months or sooner if there are new or worsening neurologic symptoms.  45-minute office visit with the majority of the time spent face-to-face for history and physical, discussion/counseling and decision-making.  Additional time with record review and documentation.    Cameron [name] A. Felecia Shelling, MD, [facility] 40/06/7094, 4:38 PM Certified in Neurology, Clinical Neurophysiology, Sleep Medicine and Neuroimaging  [facility] [address], [place],  [address] [phone]

## 2020-02-20 NOTE — Telephone Encounter (Signed)
scheduled in Feb 2022  LVM for pt to call back to schedule mri

## 2020-03-06 ENCOUNTER — Ambulatory Visit: Payer: BC Managed Care – PPO | Admitting: Urology

## 2020-03-11 ENCOUNTER — Ambulatory Visit: Payer: BC Managed Care – PPO

## 2020-03-16 ENCOUNTER — Other Ambulatory Visit: Payer: Self-pay

## 2020-03-19 ENCOUNTER — Ambulatory Visit: Payer: BC Managed Care – PPO | Admitting: Urology

## 2020-03-23 ENCOUNTER — Other Ambulatory Visit (INDEPENDENT_AMBULATORY_CARE_PROVIDER_SITE_OTHER): Payer: Self-pay

## 2020-03-23 DIAGNOSIS — Z0289 Encounter for other administrative examinations: Secondary | ICD-10-CM

## 2020-03-23 DIAGNOSIS — Z79899 Other long term (current) drug therapy: Secondary | ICD-10-CM

## 2020-03-24 LAB — HEPATIC FUNCTION PANEL
ALT: 15 [IU]/L (ref 0–44)
AST: 19 [IU]/L (ref 0–40)
Albumin: 4 g/dL (ref 3.8–4.8)
Alkaline Phosphatase: 84 [IU]/L (ref 44–121)
Bilirubin Total: 0.5 mg/dL (ref 0.0–1.2)
Bilirubin, Direct: 0.12 mg/dL (ref 0.00–0.40)
Total Protein: 6.6 g/dL (ref 6.0–8.5)

## 2020-03-31 ENCOUNTER — Other Ambulatory Visit: Payer: Self-pay | Admitting: Neurology

## 2020-03-31 DIAGNOSIS — R7989 Other specified abnormal findings of blood chemistry: Secondary | ICD-10-CM

## 2020-04-01 ENCOUNTER — Encounter: Payer: Self-pay | Admitting: Urology

## 2020-04-01 ENCOUNTER — Ambulatory Visit
Admission: RE | Admit: 2020-04-01 | Discharge: 2020-04-01 | Disposition: A | Discharge status: Home / Self Care | Payer: BC Managed Care – PPO | Source: Ambulatory Visit | Attending: Urology | Admitting: Urology

## 2020-04-01 ENCOUNTER — Ambulatory Visit: Payer: BC Managed Care – PPO | Admitting: Urology

## 2020-04-01 ENCOUNTER — Encounter: Payer: Self-pay | Admitting: Family Medicine

## 2020-04-01 ENCOUNTER — Ambulatory Visit: Payer: BC Managed Care – PPO | Admitting: Family Medicine

## 2020-04-01 ENCOUNTER — Other Ambulatory Visit: Payer: Self-pay

## 2020-04-01 ENCOUNTER — Ambulatory Visit
Admission: RE | Admit: 2020-04-01 | Discharge: 2020-04-01 | Disposition: A | Discharge status: Home / Self Care | Payer: BC Managed Care – PPO | Attending: Urology | Admitting: Urology

## 2020-04-01 VITALS — BP 111/79 | HR 75 | Ht 71.0 in | Wt 164.0 lb

## 2020-04-01 VITALS — BP 111/79 | HR 76 | Temp 97.4°F | Ht 71.0 in | Wt 164.0 lb

## 2020-04-01 DIAGNOSIS — N201 Calculus of ureter: Secondary | ICD-10-CM | POA: Insufficient documentation

## 2020-04-01 DIAGNOSIS — M5116 Intervertebral disc disorders with radiculopathy, lumbar region: Secondary | ICD-10-CM

## 2020-04-01 DIAGNOSIS — G35 Multiple sclerosis: Secondary | ICD-10-CM

## 2020-04-01 DIAGNOSIS — K4090 Unilateral inguinal hernia, without obstruction or gangrene, not specified as recurrent: Secondary | ICD-10-CM

## 2020-04-01 DIAGNOSIS — Z87442 Personal history of urinary calculi: Secondary | ICD-10-CM

## 2020-04-01 DIAGNOSIS — R399 Unspecified symptoms and signs involving the genitourinary system: Secondary | ICD-10-CM | POA: Diagnosis not present

## 2020-04-01 MED ORDER — CYCLOBENZAPRINE HCL 10 MG PO TABS: 5.0000 mg | ORAL_TABLET | Freq: Every day | ORAL | 2 refills | Status: DC

## 2020-04-01 MED ORDER — DEXAMETHASONE SODIUM PHOSPHATE 10 MG/ML IJ SOLN
10.0000 mg | Freq: Once | INTRAMUSCULAR | Status: AC
  Administered 2020-04-01: 10 mg via INTRAMUSCULAR

## 2020-04-01 MED ORDER — PREDNISONE 20 MG PO TABS: ORAL_TABLET | ORAL | 0 refills | Status: DC

## 2020-04-01 MED ORDER — HYDROCODONE-ACETAMINOPHEN 5-325 MG PO TABS: 1.0000 | ORAL_TABLET | Freq: Four times a day (QID) | ORAL | 0 refills | Status: AC | PRN

## 2020-04-01 MED ORDER — KETOROLAC TROMETHAMINE 60 MG/2ML IM SOLN
60.0000 mg | Freq: Once | INTRAMUSCULAR | Status: AC
  Administered 2020-04-01: 60 mg via INTRAMUSCULAR

## 2020-04-01 NOTE — Progress Notes (Signed)
Cameron Kistler T. Lynnette Pote, MD, [place] at [facility] [place], [address]  Phone: [phone]  FAX: [phone]  Cameron [name] - 64 y.o. male  MRN [id number]  Date of Birth: 15-Aug-1955  Date: 04/01/2020  PCP: Venia Carbon, MD  Referral: Venia Carbon, MD  Chief Complaint  Patient presents with  . Low Back Pain    Did stretching exercises yesterday and pain started soon after. Recent relapse of MS. Tried lidocaine patches, ibuprofen, and deep heat cream. Tried back brace.    This visit occurred during the SARS-CoV-2 public health emergency.  Safety protocols were in place, including screening questions prior to the visit, additional usage of staff PPE, and extensive cleaning of exam room while observing appropriate contact time as indicated for disinfecting solutions.   Subjective:   Cameron [name] is a 64 y.o. very pleasant male patient with Body mass index is 22.87 kg/m. who presents with the following:  Recent back pain: 64 year old gentleman who is Cameron [name] for age who presents with some acute onset severe back pain that radiates down the left side.  He is having tingling and significant and severe pain going down the left.  This is entirely new for the patient and if started today.  Roughly at 3:00 he started to have some severe pain and he describes this is 12 out of 10.  He does have multiple sclerosis, and he recently had a flareup.  He denies any numbness or tingling.  He denies any focal weakness.  Prior MRI of the spine from 2014, lumbar, does show multiple levels of minimal disc bulging.  At that point there is no significant canal or foraminal stenosis.  MRI of the thoracic spine, and there there was some minimal degenerative changes at multi level without nerve compression or spinal stenosis per report.  He denies any bowel or bladder incontinence, and he denies any  saddle anesthesia.  Last evening he was doing some work and he did develop some tenderness.  This was nothing like the acute severe pain that he experienced this afternoon. Has some ms.  In the last few months has been doing some ms stretches and about three o'clock Had both knees up stretching and then felt sore.  This morning was not so back.    Felt a little bit better.  Got home and tried a brace.   Back and radiating back down the aspect of his back.    CHECK WITH [facility] ABOUT PT --- SEND A MESSAGE TO ASK DR. SATER  Patient Active Problem List   Diagnosis Date Noted  . Lower urinary tract symptoms (LUTS) 04/01/2020  . Low vitamin D level 02/20/2020  . High risk medication use 11/04/2019  . Vitamin D deficiency 11/04/2019  . Left foot drop 11/04/2019  . Vision disturbance 11/04/2019  . Balance disorder 09/24/2019  . Abnormal MRI of head 09/24/2019  . History of nephrolithiasis 03/07/2019  . Left inguinal hernia 03/07/2019  . Ureteral calculus 07/25/2018  . Hydronephrosis with urinary obstruction due to ureteral calculus 07/25/2018  . Anxiety 12/22/2017  . Low back pain 05/29/2014  . Routine general medical examination at a health care facility 02/23/2012  . GERD (gastroesophageal reflux disease)   . Hyperlipemia 10/03/2007  . Seasonal allergic rhinitis due to pollen 07/19/2006  . RENAL CALCULUS 07/19/2006  . Multiple sclerosis ([place]) 10/16/2005    Past Medical History:  Diagnosis  Date  . Abnormal MRI 2007   nonspecific white matter abnormalities  . Allergy   . Anxiety   . GERD (gastroesophageal reflux disease)   . History of kidney stones   . Hyperlipidemia   . Migraines    OCULAR    Past Surgical History:  Procedure Laterality Date  . CYSTOSCOPY/URETEROSCOPY/HOLMIUM LASER/STENT PLACEMENT Right 07/27/2018   Procedure: CYSTOSCOPY/URETEROSCOPY/HOLMIUM LASER/STENT PLACEMENT;  Surgeon: Abbie Sons, MD;  Location: [facility] ORS;  Service: Urology;  Laterality:  Right;  . URETEROLITHOTOMY  11/2001  . US ECHOCARDIOGRAPHY  11/2004   normal, Carotids normal    Family History  Problem Relation Age of Onset  . Diabetes Father   . Heart disease Father   . Dementia Father   . Dementia Paternal Grandmother   . CAD Neg Hx   . Hypertension Neg Hx   . Cancer Neg Hx      Review of Systems is noted in the HPI, as appropriate   Objective:   BP 111/79 Comment: this morning  Pulse 76   Temp (!) 97.4 F (36.3 C)   Ht 5\' 11"  (1.803 m)   Wt 164 lb (74.4 kg)   SpO2 98%   BMI 22.87 kg/m    Range of motion at  the waist: Flexion, extension, lateral bending and rotation: He is severely limited in his ability to move at the waist, with severe pain so I did not move him to any sort of extent.  No echymosis or edema Rises to examination table with mild difficulty Gait: Minimally able to stand and walk  Inspection/Deformity: N Paraspinus Tenderness: Diffuse from L2-S1 bilaterally  B Ankle Dorsiflexion (L5,4): 5/5 B Great Toe Dorsiflexion (L5,4): 5/5 Ankle flexion and extension tested while seated, 5/5  SENSORY B Medial Foot (L4): WNL B Dorsum (L5): WNL B Lateral (S1): WNL Light Touch: WNL Pinprick: WNL  B SLR, seated: neg B Greater Troch: NT B Log Roll: neg B Sciatic Notch: NT  Much of the exam that I would typically do in this case and was not able to do secondary to pain.  I also examined the patient's left inguinal hernia, and this was not acutely tender to palpation, but a left inguinal hernia was palpable.  Radiology:  Assessment and Plan:     ICD-10-CM   1. Lumbar disc herniation with radiculopathy  M51.16 ketorolac (TORADOL) injection 60 mg    dexamethasone (DECADRON) injection 10 mg  2. Left inguinal hernia  K40.90   3. Multiple sclerosis ([place])  G35    Acute lightninglike strike of pain consistent with acute disc herniation on the left.  IM Toradol and Decadron in the office.  Longer course of some steroids.  Flexeril at  nighttime if needed.  Norco as needed pain.  I told the patient that if he is still having significant pain with his Norco 5 the I could alter his pain regiment.  I reassured him about his inguinal hernia.  Does not appear to be incarcerated or any form of acute issue that needs to be addressed.  When all of his symptoms have calm down then he is going to think about getting a surgical evaluation.  He also wondered about some assistance with working out with any limitations secondary to his multiple sclerosis.  Meds ordered this encounter  Medications  . predniSONE (DELTASONE) 20 MG tablet    Sig: 2 tabs po for 7 days, then 1 tab po for 7 days    Dispense:  21 tablet  Refill:  0  . cyclobenzaprine (FLEXERIL) 10 MG tablet    Sig: Take 0.5-1 tablets (5-10 mg total) by mouth at bedtime.    Dispense:  30 tablet    Refill:  2  . HYDROcodone-acetaminophen (NORCO/VICODIN) 5-325 MG tablet    Sig: Take 1 tablet by mouth every 6 (six) hours as needed for up to 5 days for moderate pain or severe pain.    Dispense:  20 tablet    Refill:  0  . ketorolac (TORADOL) injection 60 mg  . dexamethasone (DECADRON) injection 10 mg   Medications Discontinued During This Encounter  Medication Reason  . Vitamin D, Ergocalciferol, (DRISDOL) 1.25 MG (50000 UNIT) CAPS capsule Completed Course   Follow-up: 3 weeks  Signed,  Sherley Mckenney T. Carr Shartzer, MD   Outpatient Encounter Medications as of 04/01/2020  Medication Sig  . esomeprazole (NEXIUM) 20 MG capsule Take 20 mg by mouth as needed.   . fluticasone (FLONASE) 50 MCG/ACT nasal spray Place 2 sprays into both nostrils daily. In each nostril  . hydrocortisone (ANUSOL-HC) 25 MG suppository UNWRAP AND INSERT 1 SUPPOSITORY INTO THE RECTUM 3 TIMES DAILY AS NEEDED  . ibuprofen (ADVIL,MOTRIN) 200 MG tablet Take 200-400 mg by mouth every 6 (six) hours as needed.  . modafinil (PROVIGIL) 200 MG tablet Take 1 tablet (200 mg total) by mouth daily.  . Teriflunomide  (AUBAGIO) 14 MG TABS Take 1 tablet by mouth daily.  . cyclobenzaprine (FLEXERIL) 10 MG tablet Take 0.5-1 tablets (5-10 mg total) by mouth at bedtime.  Marland Kitchen HYDROcodone-acetaminophen (NORCO/VICODIN) 5-325 MG tablet Take 1 tablet by mouth every 6 (six) hours as needed for up to 5 days for moderate pain or severe pain.  . predniSONE (DELTASONE) 20 MG tablet 2 tabs po for 7 days, then 1 tab po for 7 days  . [DISCONTINUED] Vitamin D, Ergocalciferol, (DRISDOL) 1.25 MG (50000 UNIT) CAPS capsule Take 1 capsule by mouth once a week for 24 weeks  . [EXPIRED] dexamethasone (DECADRON) injection 10 mg   . [EXPIRED] ketorolac (TORADOL) injection 60 mg    No facility-administered encounter medications on file as of 04/01/2020.

## 2020-04-01 NOTE — Progress Notes (Signed)
04/01/2020 8:07 AM   Cameron [name] 1955-12-28 [id number]  Referring provider: Venia Carbon, MD [address] [place],  [place] [address]  Chief Complaint  Patient presents with  . Nephrolithiasis    Urologic history: 1.  History stone disease -Ureteroscopic removal 7 mm right distal calculus 07/2018 -1 previous stone 15 years prior  HPI: 64 y.o. male presents for annual follow-up.   Denies recurrent stone symptoms  Since his visit last year he has been diagnosed with multiple sclerosis  Has noted some postvoid dribbling which is not bothersome  Occasional low back pain  Denies dysuria, gross hematuria  PSA with Dr. Silvio [name] 04/2019 0.67; scheduled for annual follow-up next month   PMH: Past Medical History:  Diagnosis Date  . Abnormal MRI 2007   nonspecific white matter abnormalities  . Allergy   . Anxiety   . GERD (gastroesophageal reflux disease)   . History of kidney stones   . Hyperlipidemia   . Migraines    OCULAR    Surgical History: Past Surgical History:  Procedure Laterality Date  . CYSTOSCOPY/URETEROSCOPY/HOLMIUM LASER/STENT PLACEMENT Right 07/27/2018   Procedure: CYSTOSCOPY/URETEROSCOPY/HOLMIUM LASER/STENT PLACEMENT;  Surgeon: Cameron Sons, MD;  Location: [facility] ORS;  Service: Urology;  Laterality: Right;  . URETEROLITHOTOMY  11/2001  . US ECHOCARDIOGRAPHY  11/2004   normal, Carotids normal    Home Medications:  Allergies as of 04/01/2020      Reactions   Citalopram Hydrobromide    REACTION: unspecified   Penicillins Other (See Comments)   REACTION: questionable REACTION: questionable      Medication List       Accurate as of April 01, 2020  8:07 AM. If you have any questions, ask your nurse or doctor.        Aubagio 14 MG Tabs Generic drug: Teriflunomide Take 1 tablet by mouth daily.   esomeprazole 20 MG capsule Commonly known as: NEXIUM Take 20 mg by mouth as needed.   fluticasone 50 MCG/ACT nasal  spray Commonly known as: FLONASE Place 2 sprays into both nostrils daily. In each nostril   hydrocortisone 25 MG suppository Commonly known as: ANUSOL-HC UNWRAP AND INSERT 1 SUPPOSITORY INTO THE RECTUM 3 TIMES DAILY AS NEEDED   ibuprofen 200 MG tablet Commonly known as: ADVIL Take 200-400 mg by mouth every 6 (six) hours as needed.   modafinil 200 MG tablet Commonly known as: PROVIGIL Take 1 tablet (200 mg total) by mouth daily.   Vitamin D (Ergocalciferol) 1.25 MG (50000 UNIT) Caps capsule Commonly known as: DRISDOL Take 1 capsule by mouth once a week for 24 weeks       Allergies:  Allergies  Allergen Reactions  . Citalopram Hydrobromide     REACTION: unspecified  . Penicillins Other (See Comments)    REACTION: questionable REACTION: questionable    Family History: Family History  Problem Relation Age of Onset  . Diabetes Father   . Heart disease Father   . Dementia Father   . Dementia Paternal Grandmother   . CAD Neg Hx   . Hypertension Neg Hx   . Cancer Neg Hx     Social History:  reports that he has never smoked. He has never used smokeless tobacco. He reports current alcohol use. He reports that he does not use drugs.   Physical Exam: BP 111/79   Pulse 75   Ht 5\' 11"  (1.803 m)   Wt 164 lb (74.4 kg)   BMI 22.87 kg/m   Constitutional:  Alert, No acute distress. HEENT: [place] AT, moist mucus membranes.  Trachea midline, no masses. Cardiovascular: No clubbing, cyanosis, or edema. Respiratory: Normal respiratory effort, no increased work of breathing. GI: Abdomen is soft, nontender, nondistended, no abdominal masses Neurologic: Grossly intact, no focal deficits, moving all 4 extremities. Psychiatric: Normal mood and affect.  Pertinent imaging: KUB performed today was personally reviewed and interpreted.  Pelvic phleboliths present.  No calcifications suspicious for recurrent urinary tract stones are identified  Assessment & Plan:    1.  History  nephrolithiasis  No evidence recurrent stone disease  Follow-up with KUB 2 years  Call earlier for recurrent stone symptoms  2.  Lower urinary tract symptoms  Mild postvoid dribbling which is not bothersome   Cameron Sons, MD  [facility] [address] [place], [place] [address] [phone]

## 2020-04-06 ENCOUNTER — Telehealth: Payer: Self-pay

## 2020-04-06 NOTE — Telephone Encounter (Signed)
[facility] - Client TELEPHONE ADVICE RECORD [facility] Patient Name: Cameron [name] Gender: Male DOB: 04-27-1955 Age: 64 Y 80 M 13 D Return Phone Number: [phone] (Primary) Address: City/State/Zip: [place] [address] Client  [facility] - Client Client Site [facility] - Day Physician Viviana Simpler- MD Contact Type Call Who Is Calling Patient / Member / Family / Caregiver Call Type Triage / Clinical Relationship To Patient Self Return Phone Number [phone] (Primary) Chief Complaint Leg Pain Reason for Call Symptomatic / Request for [place] states he was in the office last week for back pain and now he says the pain has radiated form his back to his left leg pain with numbness. Caller states the pain feels like pounding in his quadrant then tingling. Caller states he does have an appointment on Tuesday. Translation No Nurse Assessment Nurse: Cameron Sa, RN, Cathy Date/Time (Eastern Time): 04/06/2020 8:37:54 AM Confirm and document reason for call. If symptomatic, describe symptoms. ---[name] states that he was seen last week for low back pain that has becomes worse with radiation of pain/numbness into his left leg about 2 nights ago (current pain rated as a 2-9 on the 1 to 10 scale). No known injury. No fever. Alert and responsive. Does the patient have any new or worsening symptoms? ---Yes Will a triage be completed? ---Yes Related visit to physician within the last 2 weeks? ---Yes Does the PT have any chronic conditions? (i.e. diabetes, asthma, this includes High risk factors for pregnancy, etc.) ---Yes List chronic conditions. ---MS Is this a behavioral health or substance abuse call? ---No Guidelines Guideline Title Affirmed Question Affirmed Notes Nurse Date/Time (Eastern Time) Back Pain Numbness in a leg or foot (i.e., loss of sensation) Tr[place]ull, RN,  Cameron [name] 04/06/2020 8:41:29 AM Disp. Time Eilene Ghazi Time) Disposition Final User 04/06/2020 8:43:56 AM See PCP within 24 Hours Yes Trumbull, RN, Tye [place] PLEASE NOTE: All timestamps contained within this report are represented as [place] Standard Time. CONFIDENTIALTY NOTICE: This fax transmission is intended only for the addressee. It contains information that is legally privileged, confidential or otherwise protected from use or disclosure. If you are not the intended recipient, you are strictly prohibited from reviewing, disclosing, copying using or disseminating any of this information or taking any action in reliance on or regarding this information. If you have received this fax in error, please notify us immediately by telephone so that we can arrange for its return to [place]. Phone: [phone], Toll-Free: [phone], Fax: [phone] Page: 2 of 2 Call Id: [id number] Riley Disagree/Comply Comply Caller Understands Yes PreDisposition Call Doctor Care Advice Given Per Guideline SEE PCP WITHIN 24 HOURS: * IF OFFICE WILL BE OPEN: You need to be examined within the next 24 hours. Call your doctor (or NP/PA) when the office opens and make an appointment. PAIN MEDICINES: * ACETAMINOPHEN - REGULAR STRENGTH TYLENOL: Take 650 mg (two 325 mg pills) by mouth every 4 to 6 hours as needed. Each Regular Strength Tylenol pill has 325 mg of acetaminophen. The most you should take each day is 3,250 mg (10 pills a day). CALL BACK IF: * You become worse CARE ADVICE given per Back Pain (Adult) guideline. Comments User: Berton Mount, RN Date/Time Eilene Ghazi Time): 04/06/2020 8:45:21 AM [name] shares he has an 8:30 appointment scheduled for tomorrow morning. Referrals REFERRED TO PCP OFFICE

## 2020-04-06 NOTE — Telephone Encounter (Signed)
Per chart review tab pt already has scheduled appt with Dr Lorelei Pont 04/07/20 at 8:40.

## 2020-04-07 ENCOUNTER — Encounter: Payer: Self-pay | Admitting: Family Medicine

## 2020-04-07 ENCOUNTER — Ambulatory Visit: Payer: BC Managed Care – PPO | Admitting: Family Medicine

## 2020-04-07 ENCOUNTER — Other Ambulatory Visit: Payer: Self-pay

## 2020-04-07 VITALS — BP 112/80 | HR 103 | Temp 97.6°F | Ht 71.0 in | Wt 169.5 lb

## 2020-04-07 DIAGNOSIS — M5116 Intervertebral disc disorders with radiculopathy, lumbar region: Secondary | ICD-10-CM

## 2020-04-07 DIAGNOSIS — G35 Multiple sclerosis: Secondary | ICD-10-CM | POA: Diagnosis not present

## 2020-04-07 NOTE — Progress Notes (Signed)
Cameron [name] T. Adiah Guereca, MD, [place] at [facility] [place], [address]  Phone: [phone]  FAX: [phone]  Cameron [name] - 64 y.o. male  MRN [id number]  Date of Birth: 05-Apr-1956  Date: 04/07/2020  PCP: Venia Carbon, MD  Referral: Venia Carbon, MD  Chief Complaint  Patient presents with  . Follow-up    Back Pain-Now having left thigh numbness      This visit occurred during the SARS-CoV-2 public health emergency.  Safety protocols were in place, including screening questions prior to the visit, additional usage of staff PPE, and extensive cleaning of exam room while observing appropriate contact time as indicated for disinfecting solutions.   Subjective:   Cameron [name] is a 64 y.o. very pleasant male patient with Body mass index is 23.64 kg/m. who presents with the following:  F/u lumbar radiculopathy: I initially saw him on April 01, 2020, and at that point he was accompanied by his husband and he was in severe 11 out of 10 pain and with essentially not able to ambulate.  At that point I saw him wheelchair.  I did place him on a burst of some prednisone.  At this point he wanted to follow-up, get some follow-up regarding the anatomy, and some things to work on at home.  LBP has been getting better.  Anterior thigh will have some tingling and will feel like hot water on it.   Can happen in th AM.    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.,  And the right now things are calm down quite a bit  Did have some old thigh and ankle flexion  Review of Systems is noted in the HPI, as appropriate   Objective:   BP 112/80   Pulse (!) 103   Temp 97.6 F (36.4 C) (Temporal)   Ht 5\' 11"  (1.803 m)   Wt 169 lb 8 oz (76.9 kg)   SpO2 99%   BMI 23.64 kg/m    No echymosis or edema Rises to examination table with mild difficulty Gait: minimally  antalgic  Inspection/Deformity: N Paraspinus Tenderness: Mild from L4-S1, predominantly on the left  B Ankle Dorsiflexion (L5,4): 5/5 B Great Toe Dorsiflexion (L5,4): 5/5 Heel Walk (L5): WNL Toe Walk (S1): WNL Rise/Squat (L4): WNL, mild pain  SENSORY He does have some very modest numbness to light touch in the lateral thigh  B SLR, seated: neg B FABER: neg B Greater Troch: NT B Log Roll: neg B Sciatic Notch: NT   Radiology:  Assessment and Plan:     ICD-10-CM   1. Lumbar disc herniation with radiculopathy  M51.16 Ambulatory referral to Physical Therapy  2. Multiple sclerosis ([place])  [place] Ambulatory referral to Physical Therapy   He is significantly improved from the last time I saw him.  At this point he is walking upright and his pain has dramatically improved.  He continues to have some radicular symptoms down his leg.  This is all on the left side, he does have some anterior thigh numbness as well.  At baseline since he started having some multiple sclerosis he has had some weakness in the left ankle as well.  We also reviewed his anatomy in regards to spine as well as a disc herniation in general and radiculopathy in general.  He wanted to also see neuro rehab due to long-term help with his MS.  Orders Placed This Encounter  Procedures  . Ambulatory referral to Physical Therapy    Follow-up: No follow-ups on file.  Signed,  Maud Deed. Threasa Kinch, MD   Outpatient Encounter Medications as of 04/07/2020  Medication Sig  . cyclobenzaprine (FLEXERIL) 10 MG tablet Take 0.5-1 tablets (5-10 mg total) by mouth at bedtime.  Marland Kitchen esomeprazole (NEXIUM) 20 MG capsule Take 20 mg by mouth as needed.   . fluticasone (FLONASE) 50 MCG/ACT nasal spray Place 2 sprays into both nostrils daily. In each nostril  . hydrocortisone (ANUSOL-HC) 25 MG suppository UNWRAP AND INSERT 1 SUPPOSITORY INTO THE RECTUM 3 TIMES DAILY AS NEEDED  . ibuprofen (ADVIL,MOTRIN) 200 MG tablet Take 200-400 mg  by mouth every 6 (six) hours as needed.  . modafinil (PROVIGIL) 200 MG tablet Take 1 tablet (200 mg total) by mouth daily.  . predniSONE (DELTASONE) 20 MG tablet 2 tabs po for 7 days, then 1 tab po for 7 days  . Teriflunomide (AUBAGIO) 14 MG TABS Take 1 tablet by mouth daily.   No facility-administered encounter medications on file as of 04/07/2020.

## 2020-04-07 NOTE — Patient Instructions (Signed)
Low back pain and lumbar radiculopathy  Youtube; "Mikki Santee and Brad"

## 2020-04-13 ENCOUNTER — Other Ambulatory Visit: Payer: Self-pay | Admitting: Neurology

## 2020-04-13 ENCOUNTER — Encounter: Payer: Self-pay | Admitting: Family Medicine

## 2020-04-13 DIAGNOSIS — M79605 Pain in left leg: Secondary | ICD-10-CM

## 2020-04-13 DIAGNOSIS — G35 Multiple sclerosis: Secondary | ICD-10-CM

## 2020-04-15 ENCOUNTER — Other Ambulatory Visit (INDEPENDENT_AMBULATORY_CARE_PROVIDER_SITE_OTHER): Payer: BC Managed Care – PPO

## 2020-04-15 DIAGNOSIS — Z79899 Other long term (current) drug therapy: Secondary | ICD-10-CM

## 2020-04-15 DIAGNOSIS — Z0289 Encounter for other administrative examinations: Secondary | ICD-10-CM

## 2020-04-16 LAB — HEPATIC FUNCTION PANEL
ALT: 16 IU/L (ref 0–44)
AST: 15 IU/L (ref 0–40)
Albumin: 4.5 g/dL (ref 3.8–4.8)
Alkaline Phosphatase: 84 IU/L (ref 44–121)
Bilirubin Total: 0.5 mg/dL (ref 0.0–1.2)
Bilirubin, Direct: 0.14 mg/dL (ref 0.00–0.40)
Total Protein: 7.1 g/dL (ref 6.0–8.5)

## 2020-04-20 ENCOUNTER — Telehealth: Payer: Self-pay | Admitting: Neurology

## 2020-04-20 DIAGNOSIS — M5416 Radiculopathy, lumbar region: Secondary | ICD-10-CM

## 2020-04-20 DIAGNOSIS — G35 Multiple sclerosis: Secondary | ICD-10-CM

## 2020-04-20 NOTE — Telephone Encounter (Signed)
I spoke with Mr. Kimberling to get additional information about his new leg issues.  Around Thanksgiving, he had severe back pain with back stiffness and had pain running down the left leg.  Over the next few days the pain improved quite a bit but he continues to have numbness in the left leg.  I personally reviewed the MRI of the lumbar spine from October 29, 2012.  There is multilevel degenerative change.  He has mild to moderate spinal stenosis at L3-L4 and L4-L5.  Additionally there was moderate foraminal narrowing at L4-L5 towards the right and lateral recess stenosis left greater than right at L3-L4 and right greater than left at L4-L5.  I believe that the more recent numbness in the left leg is related to the pain he had that immediately proceeded it most likely from worsening of his degenerative change.  Additionally, we need to do an MRI of the brain to monitor his.  If he is having any breakthrough activity we would need to switch to a different disease modifying therapy

## 2020-04-20 NOTE — Telephone Encounter (Signed)
04/20/2020 12:15 PM I called but got voicemail and left a message.  I will try to call again later today or tomorrow.

## 2020-04-22 ENCOUNTER — Ambulatory Visit: Payer: BC Managed Care – PPO | Attending: Neurology | Admitting: Physical Therapy

## 2020-04-22 ENCOUNTER — Encounter: Payer: Self-pay | Admitting: Physical Therapy

## 2020-04-22 ENCOUNTER — Other Ambulatory Visit: Payer: Self-pay

## 2020-04-22 DIAGNOSIS — R2689 Other abnormalities of gait and mobility: Secondary | ICD-10-CM | POA: Diagnosis not present

## 2020-04-22 DIAGNOSIS — M6281 Muscle weakness (generalized): Secondary | ICD-10-CM | POA: Diagnosis present

## 2020-04-22 DIAGNOSIS — M5442 Lumbago with sciatica, left side: Secondary | ICD-10-CM | POA: Diagnosis present

## 2020-04-22 DIAGNOSIS — R2681 Unsteadiness on feet: Secondary | ICD-10-CM | POA: Diagnosis present

## 2020-04-22 DIAGNOSIS — R293 Abnormal posture: Secondary | ICD-10-CM | POA: Diagnosis present

## 2020-04-22 NOTE — Therapy (Signed)
[facility] [address] [place], [place], [address] Phone: [phone]   Fax:  [phone]  Physical Therapy Evaluation  Patient Details  Name: Cameron [name] MRN: [id number] Date of Birth: March 30, 1956 Referring Provider (PT): Despina Arias   Encounter Date: 04/22/2020   PT End of Session - 04/22/20 1800    Visit Number 1    Number of Visits 10    Authorization Type BCBS    PT Start Time 1450    PT Stop Time 1537    PT Time Calculation (min) 47 min    Activity Tolerance Patient tolerated treatment well    Behavior During Therapy [facility] for tasks assessed/performed           Past Medical History:  Diagnosis Date  . Abnormal MRI 2007   nonspecific white matter abnormalities  . Allergy   . Anxiety   . GERD (gastroesophageal reflux disease)   . History of kidney stones   . Hyperlipidemia   . Migraines    OCULAR    Past Surgical History:  Procedure Laterality Date  . CYSTOSCOPY/URETEROSCOPY/HOLMIUM LASER/STENT PLACEMENT Right 07/27/2018   Procedure: CYSTOSCOPY/URETEROSCOPY/HOLMIUM LASER/STENT PLACEMENT;  Surgeon: Riki Altes, MD;  Location: [facility] ORS;  Service: Urology;  Laterality: Right;  . URETEROLITHOTOMY  11/2001  . US ECHOCARDIOGRAPHY  11/2004   normal, Carotids normal    There were no vitals filed for this visit.    Subjective Assessment - 04/22/20 1453    Subjective Confirmed MS diagnosis since July 2021.  Had herniated disc on December 15.  Reached out to my doctors and asked for PT.  The left side is the MS-affected side and that is what is affected with the herniated disc.  Since January 2021, have had exacerbation with vision issues and balance.  Since dx and medication I'm on, I have not been exercising.  I'm concerned about losing muscle mass and staying active.  Was on the course of prednisone, and finished that; the pain is better.  Right now, the pain is a little stiff, but overall, better.   Numbness in medial and lateral aspect of L leg.  Unsure of the cause of the herniated disc; had to do alot between Thanksgiving and 12/15 (with father passing away and job duties, excess driving)    Pertinent History MS dx 2021    Patient Stated Goals Improved flexibility, improved muscle mass and stamina; core strength; balance    Currently in Pain? Yes    Pain Score 1    At worst-6 to 7/10   Pain Location Back    Pain Orientation Left;Right   L more than R   Pain Descriptors / Indicators Dull   stiffness   Pain Type Acute pain    Pain Radiating Towards Numbness and pain radiate into L thigh    Pain Onset 1 to 4 weeks ago    Pain Frequency Intermittent    Aggravating Factors  prolonged standing    Pain Relieving Factors Prednisone helped, massage helps, change of positions, stretching              [facility] PT Assessment - 04/22/20 1506      Assessment   Medical Diagnosis MS, herniated disc with L leg pain    Referring Provider (PT) Epimenio Foot, Richard    Onset Date/Surgical Date 04/01/20    Hand Dominance Right      Precautions   Precautions Fall    Precaution Comments Recent herniated disc; relieved significantly  with Prednisone course; pt to have MRI      Balance Screen   Has the patient fallen in the past 6 months No    Has the patient had a decrease in activity level because of a fear of falling?  Yes   Primary concern is fatigue and pain avoidance   Is the patient reluctant to leave their home because of a fear of falling?  No      Home Social worker Private residence    Living Arrangements Spouse/significant other    Available Help at Discharge Family    Type of [place] Access Level entry    [place] Two level;Able to live on main level with bedroom/bathroom      Prior Function   Level of Independence Independent    Vocation Full time employment    Vocation Requirements Professor of music; rehearsals; requires standing and  walking; rehearsals can be about 2-3 hours    Leisure Enjoyed working out and reading, Landscape architect      Observation/Other Assessments   Focus on Therapeutic Outcomes (FOTO)  NA    Other Surveys  Oswestry Disability Index    Oswestry Disability Index  24% disability rating; Lifting:  3, standing 2 (pain prevents from standing >1 hr), social life 2; pain intensity, walking, sleeping, sex life, traveling all score of 1      Sensation   Light Touch Appears Intact    Additional Comments Describes L lateral and medial thigh as sometimes burning, cramping      Posture/Postural Control   Posture/Postural Control Postural limitations    Postural Limitations Forward head;Decreased lumbar lordosis   R shoulder lower     ROM / Strength   AROM / PROM / Strength AROM;Strength      AROM   Overall AROM  Within functional limits for tasks performed   for BLEs; decreased flexibility lumbar   Overall AROM Comments Decreased lumbar flexion due to hamstring tightness; lumbar rotation to L 20 degrees, to R 25 degrees;  With lumbar extension [facility]), he notes pain in R lumbar area    AROM Assessment Site Other (comment)   SLR passive:  LLE 45 degrees  (pain in posterior thigh and in low back, RLE 50 degrees, pain in posterior thigh, no LB pain)     Strength   Overall Strength Deficits    Strength Assessment Site Hip;Knee;Ankle    Right/Left Hip Right;Left    Right Hip Flexion 5/5    Left Hip Flexion 3+/5   pain in lumbar spine   Right/Left Knee Right;Left    Right Knee Flexion 5/5    Right Knee Extension 5/5    Left Knee Flexion 4/5    Left Knee Extension 3+/5   pain in lumbar spine   Right/Left Ankle Left;Right    Right Ankle Dorsiflexion 5/5    Left Ankle Dorsiflexion 4/5      Palpation   Palpation comment No tenderness to palpation along paraspinal muscles R or L      Transfers   Transfers Sit to Stand;Stand to Sit    Sit to Stand 7: Independent;Without upper extremity assist;From  chair/3-in-1    Five time sit to stand comments  9.69    Stand to Sit 7: Independent;Without upper extremity assist;To chair/3-in-1      Ambulation/Gait   Ambulation/Gait Yes    Ambulation/Gait Assistance 6: Modified independent (Device/Increase time)    Ambulation  Distance (Feet) 80 Feet    Assistive device None    Gait Pattern Step-through pattern;Wide base of support   Pt reports guarded gait pattern due to decreased balance   Ambulation Surface Level;Indoor    Gait velocity 7.65 sec =4.29 ft/sec      Standardized Balance Assessment   Standardized Balance Assessment Timed Up and Go Test      Timed Up and Go Test   Normal TUG (seconds) 8.97           With attempts at R hip flexion (bringing into SLR position passively and actively, to bend R hip and knee,) pt notes pain in R low back.  Pt notes relief with single knee to chest motion (and demo doing this correctly at home already)  With standing lumbar extension:  Pt notes pain R lumbar musculature.  With supine pelvic tilts, pt notes relief with posterior pelvic tilt and notes pain in R lumbar musculature with anterior pelvic tilt.           Objective measurements completed on examination: See above findings.               PT Education - 04/22/20 1759    Education Details PT eval results, POC    Person(s) Educated Patient    Methods Explanation    Comprehension Verbalized understanding               PT Long Term Goals - 04/22/20 1812      PT LONG TERM GOAL #1   Title Pt will be independent with HEP for improved functional mobility, strength, balance, and decreased pain.  TARGET 05/22/2020    Time 5    Period Weeks    Status New      PT LONG TERM GOAL #2   Title Pt will verbalize at least 3 means to decrease low back pain for improved functional mobility.    Time 5    Period Weeks    Status New      PT LONG TERM GOAL #3   Title Pt will rate improvement in Oswestry Disability Index to less  than or equal to 20% disability, to demonstrate less pain with functional activities.    Time 5    Period Weeks    Status New      PT LONG TERM GOAL #4   Title FGA to be assessed, wit goal to be written as appropriate.    Time 5    Period Weeks    Status New      PT LONG TERM GOAL #5   Title Pt will verbalize understanding of energy conservation and fall prevention education.    Time 5    Period Weeks    Status New                  Plan - 04/22/20 1801    Clinical Impression Statement Pt is a 65 year old male who presents with recent confirmed dx of MS (June 2021), with reported weakness, decreased sensation, and pain in LLE, with recent history of herniated disc (03/2020).  With course of prednisone, pt's pain has resolved, though he does note continued stiffness in low back and occasional pain and sensation changes into L thigh area.  Pt presents today with decreased strength in LLE, decreased flexiibilty in hamstrings and in low back, unsteadiness on feet, abnormality of gait.  Prior to 1 year ago, when MS exaccerbation started, he was independent, working full-time, and working  out at the gym regularly.  Back pain and LLE weakness has been a limiting factor with prolonged standing and walking and pt has not resumed gym activities since the summer.  Oswestry Disability Index score is 24%, rating as minimal disability.  Time limiations prevented balance testing today, but pt would benefit from further balance testing, as he reports changes in balance with varied lighting and on varied surfaces.  He would benefit from skilled PT to address the above stated deficits for improved overall functional mobiltiy, decreased pain, and decreased fall risk.    Personal Factors and Comorbidities Comorbidity 3+    Comorbidities Anxiety, GERD, hx of ocular migraines, hx of kidney stones    Examination-Activity Limitations Locomotion Level;Transfers;Sit;Stand;Stairs    Examination-Participation  Restrictions Occupation;Community Activity;Other   Community fitness at gym   Stability/Clinical Decision Making Evolving/Moderate complexity    Clinical Decision Making Moderate    Rehab Potential Good    PT Frequency 2x / week   1x/wk after eval, then   PT Duration Other (comment)   for 4 weeks; total POC = 5 weeks   PT Treatment/Interventions ADLs/Self Care Home Management;Electrical Stimulation;Neuromuscular re-education;Balance training;Therapeutic exercise;Therapeutic activities;Functional mobility training;Ultrasound;Moist Heat;Traction;Gait training;Stair training;Patient/family education;Manual techniques;Energy conservation;Passive range of motion    PT Next Visit Plan Further assess balance-FGA and vestibular system testing; low back flexibility, trunk strengthening, work in pain-free ranges for low back stability; initiate HEP    Consulted and Agree with Plan of Care Patient           Patient will benefit from skilled therapeutic intervention in order to improve the following deficits and impairments:  Abnormal gait,Decreased range of motion,Difficulty walking,Pain,Decreased balance,Impaired flexibility,Decreased mobility,Decreased strength,Postural dysfunction  Visit Diagnosis: Other abnormalities of gait and mobility  Acute bilateral low back pain with left-sided sciatica  Muscle weakness (generalized)  Unsteadiness on feet  Abnormal posture    Problem List Patient Active Problem List   Diagnosis Date Noted  . Lower urinary tract symptoms (LUTS) 04/01/2020  . Low vitamin D level 02/20/2020  . High risk medication use 11/04/2019  . Vitamin D deficiency 11/04/2019  . Left foot drop 11/04/2019  . Vision disturbance 11/04/2019  . Balance disorder 09/24/2019  . Abnormal MRI of head 09/24/2019  . History of nephrolithiasis 03/07/2019  . Left inguinal hernia 03/07/2019  . Ureteral calculus 07/25/2018  . Hydronephrosis with urinary obstruction due to ureteral calculus  07/25/2018  . Anxiety 12/22/2017  . Low back pain 05/29/2014  . Routine general medical examination at a health care facility 02/23/2012  . GERD (gastroesophageal reflux disease)   . Hyperlipemia 10/03/2007  . Seasonal allergic rhinitis due to pollen 07/19/2006  . RENAL CALCULUS 07/19/2006  . Multiple sclerosis ([place]) 10/16/2005    MARRIOTT,AMY W. 04/22/2020, 6:16 PM  Frazier Butt., PT   [place] [address] [place], [place], [address] Phone: [phone]   Fax:  [phone]  Name: OTHOR VINES MRN: [id number] Date of Birth: October 20, 1955

## 2020-04-23 ENCOUNTER — Telehealth: Payer: Self-pay | Admitting: Neurology

## 2020-04-23 MED ORDER — ALPRAZOLAM 0.5 MG PO TABS: ORAL_TABLET | ORAL | 0 refills | Status: DC

## 2020-04-23 NOTE — Telephone Encounter (Signed)
no to the covid questions MR Brain w/wo contrast & MR Lumbar spine wo contrast Dr. Gershon Mussel Berkley Harvey: [phone] (exp. 04/23/20 to 10/19/20). Patient is scheduled at [facility] for 04/29/20.   Patient informed me he is claustrophobic and would need something to help him. He is aware to have a driver.

## 2020-04-23 NOTE — Addendum Note (Signed)
Addended by: Despina Arias A on: 04/23/2020 12:50 PM   Modules accepted: Orders

## 2020-04-23 NOTE — Addendum Note (Signed)
Addended by: Arther Abbott on: 04/23/2020 12:06 PM   Modules accepted: Orders

## 2020-04-24 ENCOUNTER — Ambulatory Visit: Payer: BC Managed Care – PPO | Admitting: Physical Therapy

## 2020-04-24 ENCOUNTER — Encounter: Payer: Self-pay | Admitting: Physical Therapy

## 2020-04-24 ENCOUNTER — Other Ambulatory Visit: Payer: Self-pay

## 2020-04-24 DIAGNOSIS — M5442 Lumbago with sciatica, left side: Secondary | ICD-10-CM

## 2020-04-24 DIAGNOSIS — R293 Abnormal posture: Secondary | ICD-10-CM

## 2020-04-24 DIAGNOSIS — M6281 Muscle weakness (generalized): Secondary | ICD-10-CM

## 2020-04-24 DIAGNOSIS — R2681 Unsteadiness on feet: Secondary | ICD-10-CM

## 2020-04-24 DIAGNOSIS — R2689 Other abnormalities of gait and mobility: Secondary | ICD-10-CM | POA: Diagnosis not present

## 2020-04-24 NOTE — Therapy (Signed)
[facility] [address] [place], [place], [address] Phone: [phone]   Fax:  [phone]  Physical Therapy Treatment  Patient Details  Name: Cameron [name] MRN: [id number] Date of Birth: 26-Nov-1955 Referring Provider (PT): Arlice Colt   Encounter Date: 04/24/2020   PT End of Session - 04/24/20 0904    Visit Number 2    Number of Visits 10    Authorization Type BCBS    PT Start Time 0802    PT Stop Time 0848    PT Time Calculation (min) 46 min    Activity Tolerance Patient tolerated treatment well;No increased pain    Behavior During Therapy WFL for tasks assessed/performed           Past Medical History:  Diagnosis Date  . Abnormal MRI 2007   nonspecific white matter abnormalities  . Allergy   . Anxiety   . GERD (gastroesophageal reflux disease)   . History of kidney stones   . Hyperlipidemia   . Migraines    OCULAR    Past Surgical History:  Procedure Laterality Date  . CYSTOSCOPY/URETEROSCOPY/HOLMIUM LASER/STENT PLACEMENT Right 07/27/2018   Procedure: CYSTOSCOPY/URETEROSCOPY/HOLMIUM LASER/STENT PLACEMENT;  Surgeon: Abbie Sons, MD;  Location: [facility] ORS;  Service: Urology;  Laterality: Right;  . URETEROLITHOTOMY  11/2001  . US ECHOCARDIOGRAPHY  11/2004   normal, Carotids normal    There were no vitals filed for this visit.   Subjective Assessment - 04/24/20 0802    Subjective Nothing new this morning.  Have done my normal stretching this morning already.    Pertinent History MS dx 2021    Patient Stated Goals Improved flexibility, improved muscle mass and stamina; core strength; balance    Currently in Pain? Yes    Pain Score 3     Pain Location Leg   L thigh   Pain Orientation Left    Pain Descriptors / Indicators Dull   Aggravating   Pain Type Acute pain    Pain Onset 1 to 4 weeks ago    Pain Frequency Intermittent    Aggravating Factors  worse in the mornings    Pain Relieving  Factors stretching, massage helps              [facility] PT Assessment - 04/24/20 0001      High Level Balance   High Level Balance Comments Stands EO/EC 30 seconds solid surface;      Functional Gait  Assessment   Gait assessed  Yes    Gait Level Surface Walks 20 ft in less than 5.5 sec, no assistive devices, good speed, no evidence for imbalance, normal gait pattern, deviates no more than 6 in outside of the 12 in walkway width.    Change in Gait Speed Able to smoothly change walking speed without loss of balance or gait deviation. Deviate no more than 6 in outside of the 12 in walkway width.    Gait with Horizontal Head Turns Performs head turns smoothly with no change in gait. Deviates no more than 6 in outside 12 in walkway width    Gait with Vertical Head Turns Performs head turns with no change in gait. Deviates no more than 6 in outside 12 in walkway width.    Gait and Pivot Turn Pivot turns safely within 3 sec and stops quickly with no loss of balance.    Step Over Obstacle Is able to step over 2 stacked shoe boxes taped together (9 in total height)  without changing gait speed. No evidence of imbalance.    Gait with Narrow Base of Support Is able to ambulate for 10 steps heel to toe with no staggering.    Gait with Eyes Closed Cannot walk 20 ft without assistance, severe gait deviations or imbalance, deviates greater than 15 in outside 12 in walkway width or will not attempt task.   stopped early, veered to R   Ambulating Backwards Walks 20 ft, no assistive devices, good speed, no evidence for imbalance, normal gait    Steps Alternating feet, no rail.    Total Score 27                         [facility] Adult PT Treatment/Exercise - 04/24/20 0001      Posture/Postural Control   Posture/Postural Control Postural limitations    Postural Limitations Forward head;Rounded Shoulders;Anterior pelvic tilt    Posture Comments Increased lumbar lordosis today, with shoulders  posterior to hips      Exercises   Exercises Lumbar;Other Exercises    Other Exercises  Supine posture decompression exercises:  shoulder press x 5 reps, leg press x 5 reps, leg lengthener x 5 reps bilaterally (pt reports slightly decreased pain in R low back with RLE exercises).  Seated postural correction exercises:  chin tucks x 5 reps with neck retraction; posterior pelvic tilts>neutral pelvis with facilitation x 8 reps.  Cues in neutral lumbar spine position for abdominal activation.  Attempted postural education in standing with neutral lumbar spine, scapular squeeze and neck retraction (pt felt like this was too much, and so we went into postural correction in supine)      Lumbar Exercises: Seated   Other Seated Lumbar Exercises With neutral spine and cues for abdominal activation, scapular squeezes x 5 reps          Seated and standing postural correction to neutral lumbar spine, bringing shoulders more anterior (as they initially were posterior to hips) and scapular retraction, neck retraction/chin tuck (to relax these positions and not overdo it).  Discussed recalibration/re-education to muscles for improved posture and how this may be adding to strain in low back area.        PT Education - 04/24/20 0903    Education Details results of FGA and balance testing; postural education, initiated HEP and educated pt NOT to overdo exercises    Person(s) Educated Patient    Methods Explanation;Demonstration;Handout    Comprehension Returned demonstration;Verbal cues required;Verbalized understanding               PT Long Term Goals - 04/24/20 0909      PT LONG TERM GOAL #1   Title Pt will be independent with HEP for improved functional mobility, strength, balance, and decreased pain.  TARGET 05/22/2020    Time 5    Period Weeks    Status New      PT LONG TERM GOAL #2   Title Pt will verbalize at least 3 means to decrease low back pain for improved functional mobility.     Time 5    Period Weeks    Status New      PT LONG TERM GOAL #3   Title Pt will rate improvement in Oswestry Disability Index to less than or equal to 20% disability, to demonstrate less pain with functional activities.    Time 5    Period Weeks    Status New      PT LONG TERM  GOAL #4   Title FGA score to improve to 29/30, to indicate improved balance in setting with eyes closed or decreased visual input.    Time 5    Period Weeks    Status Revised      PT LONG TERM GOAL #5   Title Pt will verbalize understanding of energy conservation and fall prevention education.    Time 5    Period Weeks    Status New                 Plan - 04/24/20 0905    Clinical Impression Statement Assessed FGA today, with score ot 27/30 (only item unable to complete was EC with gait-pt veers to R and unable to complete).  Assessed balance on solid and foam surface EO and EC; able to hold with minimal sway on last condition.  Pt does report throughout both tests clawing of L foot and toes to help with balance.  Explained results of these tests and that we will address balance through HEP on varied surfaces and conditions.  Focused remainder of session on postural re-education and exercises, initiating HEP to address this.    Personal Factors and Comorbidities Comorbidity 3+    Comorbidities Anxiety, GERD, hx of ocular migraines, hx of kidney stones    Examination-Activity Limitations Locomotion Level;Transfers;Sit;Stand;Stairs    Examination-Participation Restrictions Occupation;Community Activity;Other   Community fitness at gym   Stability/Clinical Decision Making Evolving/Moderate complexity    Rehab Potential Good    PT Frequency 2x / week   1x/wk after eval, then   PT Duration Other (comment)   for 4 weeks; total POC = 5 weeks   PT Treatment/Interventions ADLs/Self Care Home Management;Electrical Stimulation;Neuromuscular re-education;Balance training;Therapeutic exercise;Therapeutic  activities;Functional mobility training;Ultrasound;Moist Heat;Traction;Gait training;Stair training;Patient/family education;Manual techniques;Energy conservation;Passive range of motion    PT Next Visit Plan Review HEP; have pt demo his current stretching exercise program; low back flexibility, trunk strengthening, follow up on postural re-education; work in pain-free ranges for low back stability, try distraction as needed; work on balance exercises    Consulted and Agree with Plan of Care Patient           Patient will benefit from skilled therapeutic intervention in order to improve the following deficits and impairments:  Abnormal gait,Decreased range of motion,Difficulty walking,Pain,Decreased balance,Impaired flexibility,Decreased mobility,Decreased strength,Postural dysfunction  Visit Diagnosis: Muscle weakness (generalized)  Unsteadiness on feet  Acute bilateral low back pain with left-sided sciatica  Abnormal posture     Problem List Patient Active Problem List   Diagnosis Date Noted  . Lower urinary tract symptoms (LUTS) 04/01/2020  . Low vitamin D level 02/20/2020  . High risk medication use 11/04/2019  . Vitamin D deficiency 11/04/2019  . Left foot drop 11/04/2019  . Vision disturbance 11/04/2019  . Balance disorder 09/24/2019  . Abnormal MRI of head 09/24/2019  . History of nephrolithiasis 03/07/2019  . Left inguinal hernia 03/07/2019  . Ureteral calculus 07/25/2018  . Hydronephrosis with urinary obstruction due to ureteral calculus 07/25/2018  . Anxiety 12/22/2017  . Low back pain 05/29/2014  . Routine general medical examination at a health care facility 02/23/2012  . GERD (gastroesophageal reflux disease)   . Hyperlipemia 10/03/2007  . Seasonal allergic rhinitis due to pollen 07/19/2006  . RENAL CALCULUS 07/19/2006  . Multiple sclerosis ([place]) 10/16/2005    Faye Sanfilippo W. 04/24/2020, 9:11 AM Frazier Butt., PT  [place] [address] [place], [place], [address] Phone: [phone]  Fax:  [phone]  Name: Cameron [name] MRN: [id number] Date of Birth: Mar 20, 1956

## 2020-04-24 NOTE — Addendum Note (Signed)
Addended by: Frazier Butt on: 04/24/2020 09:18 AM   Modules accepted: Orders

## 2020-04-24 NOTE — Patient Instructions (Signed)
Provided patient with postural correction/decompression exercises in supine:  -shoulder press x 5 reps  -neck press x 5 reps (verbalized understanding)  -leg press x 5 reps  -leg lengthener x 5 reps

## 2020-04-27 ENCOUNTER — Encounter: Payer: Self-pay | Admitting: Physical Therapy

## 2020-04-27 ENCOUNTER — Ambulatory Visit: Payer: BC Managed Care – PPO | Admitting: Physical Therapy

## 2020-04-27 ENCOUNTER — Other Ambulatory Visit: Payer: Self-pay

## 2020-04-27 DIAGNOSIS — M5442 Lumbago with sciatica, left side: Secondary | ICD-10-CM

## 2020-04-27 DIAGNOSIS — R2689 Other abnormalities of gait and mobility: Secondary | ICD-10-CM | POA: Diagnosis not present

## 2020-04-27 DIAGNOSIS — M6281 Muscle weakness (generalized): Secondary | ICD-10-CM

## 2020-04-27 NOTE — Patient Instructions (Signed)
Access Code: [email or web address] URL: [email or web address] Date: 04/27/2020 Prepared by: Mady Haagensen  Exercises Supine Bridge - 1 x daily - 7 x weekly - 1-2 sets - 10 reps - 3 sec hold Small Range Straight Leg Raise - 1 x daily - 7 x weekly - 1-2 sets - 10 reps Seated Hamstring Stretch - 1 x daily - 7 x weekly - 1 sets - 3 reps - 30 sec hold

## 2020-04-27 NOTE — Therapy (Signed)
[facility] [address] [place], [place], [address] Phone: [phone]   Fax:  [phone]  Physical Therapy Treatment  Patient Details  Name: Cameron [name] MRN: [id number] Date of Birth: [birth year] Referring Provider (PT): Arlice Colt   Encounter Date: 04/27/2020   PT End of Session - 04/27/20 0901    Visit Number 3    Number of Visits 10    Authorization Type BCBS    PT Start Time 0354    PT Stop Time 0852    PT Time Calculation (min) 54 min    Activity Tolerance Patient tolerated treatment well;No increased pain    Behavior During Therapy WFL for tasks assessed/performed           Past Medical History:  Diagnosis Date  . Abnormal MRI 2007   nonspecific white matter abnormalities  . Allergy   . Anxiety   . GERD (gastroesophageal reflux disease)   . History of kidney stones   . Hyperlipidemia   . Migraines    OCULAR    Past Surgical History:  Procedure Laterality Date  . CYSTOSCOPY/URETEROSCOPY/HOLMIUM LASER/STENT PLACEMENT Right 07/27/2018   Procedure: CYSTOSCOPY/URETEROSCOPY/HOLMIUM LASER/STENT PLACEMENT;  Surgeon: Abbie Sons, MD;  Location: [facility];  Service: Urology;  Laterality: Right;  . URETEROLITHOTOMY  11/2001  . US ECHOCARDIOGRAPHY  11/2004   normal, Carotids normal    There were no vitals filed for this visit.   Subjective Assessment - 04/27/20 0759    Subjective Having a little pain in R side of low back, from standing 5 hrs on Saturday in the kitchen.  Did try the exercises, and think I may have overdone it.    Pertinent History MS dx 2021    Patient Stated Goals Improved flexibility, improved muscle mass and stamina; core strength; balance    Currently in Pain? Yes    Pain Score 3     Pain Location Back    Pain Orientation Right;Lower    Pain Descriptors / Indicators Dull    Pain Type Acute pain    Pain Onset 1 to 4 weeks ago    Pain Frequency Constant    Aggravating  Factors  prolonged standing    Pain Relieving Factors lidocaine patch; ibruprofen    Multiple Pain Sites Yes    Pain Score 2    Pain Location Leg   thigh   Pain Orientation Left    Pain Descriptors / Indicators Aching;Dull    Pain Type Acute pain    Pain Onset 1 to 4 weeks ago    Pain Frequency Constant    Aggravating Factors  unsure about some of the exercises may be aggravating    Pain Relieving Factors unsure                   Review of pt's current exercise routine:  Full body stretch, reaching arms above head SKTC Sidelying Hip abduction-cues provided for L side to keep hips stacked to avoid hip external rotation and to keep foot pointed straight for neutral hip position; when performing on R side, pt c/o pain in R PSIS area.  Attempted clamshell on R side, with pt c/o no pain.  MMT R hip abduction 5/5, L hip abduction 4/5.  Advised that pt could just not perform on R side, with good strength and to avoid R SI pain. SLR-see cues below Abdominal crunches in supine  Bridging in ankle dorsiflexion-see cues below Supine trunk rotation rocking Hip abduction/external  rotation stretch bilateral, then with one leg extended in long sitting  Standing SLS-10-15 seconds, UE support as needed           [facility] Adult PT Treatment/Exercise - 04/27/20 0804      Self-Care   Self-Care Posture    Posture Provided handouts for posture education in workplace sitting positions and for standing with changes of standing positions in times of prolonged standing (ie-rehearsals, lectures at work)      Exercises   Exercises Lumbar      Lumbar Exercises: Stretches   Active Hamstring Stretch Right;Left;2 reps;30 seconds   Seated, foot propped on floor, cues for technique   Active Hamstring Stretch Limitations Attempted supine with belt, but pt strains too much with upper body.  Also attempted standing hamstring stretch at counter, wide BOS, hands on counter, posterior lean for hip  flexion.  Cues for technique to achieve hamstring stretch but avoid R low back pain.    Other Lumbar Stretch Exercise PT provides passive/manual distraction to lumbar spine, with leg extended, 5 reps 5-10 seconds each, bilaterally      Lumbar Exercises: Supine   Bridge 5 reps;3 seconds    Bridge Limitations Cues for abdominal activation    Straight Leg Raise 5 reps    Straight Leg Raises Limitations Cues for abdominal activation                  PT Education - 04/27/20 0901    Education Details Updates to HEP; education on correct way to perform some of the exercises he is already doing at home; posture education for workplace    Person(s) Educated Patient    Methods Explanation;Demonstration;Handout    Comprehension Verbalized understanding;Returned demonstration               PT Long Term Goals - 04/24/20 0909      PT LONG TERM GOAL #1   Title Pt will be independent with HEP for improved functional mobility, strength, balance, and decreased pain.  TARGET 05/22/2020    Time 5    Period Weeks    Status New      PT LONG TERM GOAL #2   Title Pt will verbalize at least 3 means to decrease low back pain for improved functional mobility.    Time 5    Period Weeks    Status New      PT LONG TERM GOAL #3   Title Pt will rate improvement in Oswestry Disability Index to less than or equal to 20% disability, to demonstrate less pain with functional activities.    Time 5    Period Weeks    Status New      PT LONG TERM GOAL #4   Title FGA score to improve to 29/30, to indicate improved balance in setting with eyes closed or decreased visual input.    Time 5    Period Weeks    Status Revised      PT LONG TERM GOAL #5   Title Pt will verbalize understanding of energy conservation and fall prevention education.    Time 5    Period Weeks    Status New                 Plan - 04/27/20 0902    Clinical Impression Statement Focus today on lumbar stabilization, as  pt demo his current HEP.  PT makes recommendations for lumbar stabilization with bridging and with SLR; also added seated hamstring stretch to  HEP.  Advised pt to hold on sidelying hip abduction, especially on R side, as he demo good strength, but that also reproduces pain in R PSIS area.  With exercises in session today, pt reports slightly less pain in R lumbar spine/SI area.  Pt may be having some SI pain on R side, as it bothers him with motions like bringing RLE forward out of standing hamstring stretch position; may need to investigate this further.  He will continue to benefit from skilled PT to reduce back and referred pain as well as work on trunk and lower extremity strength and balance.    Personal Factors and Comorbidities Comorbidity 3+    Comorbidities Anxiety, GERD, hx of ocular migraines, hx of kidney stones    Examination-Activity Limitations Locomotion Level;Transfers;Sit;Stand;Stairs    Examination-Participation Restrictions Occupation;Community Activity;Other   Community fitness at gym   Stability/Clinical Decision Making Evolving/Moderate complexity    Rehab Potential Good    PT Frequency 2x / week   1x/wk after eval, then   PT Duration Other (comment)   for 4 weeks; total POC = 5 weeks   PT Treatment/Interventions ADLs/Self Care Home Management;Electrical Stimulation;Neuromuscular re-education;Balance training;Therapeutic exercise;Therapeutic activities;Functional mobility training;Ultrasound;Moist Heat;Traction;Gait training;Stair training;Patient/family education;Manual techniques;Energy conservation;Passive range of motion    PT Next Visit Plan Review updates to HEP; trunk and core stability/strengthening, follow up on postural re-education, especially for standing prolonged periods; work in pain-free ranges for low back stability, try distraction as needed;  work on balance exercises    Consulted and Agree with Plan of Care Patient           Patient will benefit from  skilled therapeutic intervention in order to improve the following deficits and impairments:  Abnormal gait,Decreased range of motion,Difficulty walking,Pain,Decreased balance,Impaired flexibility,Decreased mobility,Decreased strength,Postural dysfunction  Visit Diagnosis: Muscle weakness (generalized)  Acute bilateral low back pain with left-sided sciatica     Problem List Patient Active Problem List   Diagnosis Date Noted  . Lower urinary tract symptoms (LUTS) 04/01/2020  . Low vitamin D level 02/20/2020  . High risk medication use 11/04/2019  . Vitamin D deficiency 11/04/2019  . Left foot drop 11/04/2019  . Vision disturbance 11/04/2019  . Balance disorder 09/24/2019  . Abnormal MRI of head 09/24/2019  . History of nephrolithiasis 03/07/2019  . Left inguinal hernia 03/07/2019  . Ureteral calculus 07/25/2018  . Hydronephrosis with urinary obstruction due to ureteral calculus 07/25/2018  . Anxiety 12/22/2017  . Low back pain 05/29/2014  . Routine general medical examination at a health care facility 02/23/2012  . GERD (gastroesophageal reflux disease)   . Hyperlipemia 10/03/2007  . Seasonal allergic rhinitis due to pollen 07/19/2006  . RENAL CALCULUS 07/19/2006  . Multiple sclerosis ([place]) 10/16/2005    Raeanna Soberanes W. 04/27/2020, 9:10 AM Frazier Butt., PT  [facility] [address] [place], [place], [address] Phone: [phone]   Fax:  [phone]  Name: SHARBEL SAHAGUN MRN: [id number] Date of Birth: [birth year]

## 2020-04-29 ENCOUNTER — Ambulatory Visit: Payer: BC Managed Care – PPO

## 2020-04-29 ENCOUNTER — Ambulatory Visit: Payer: BC Managed Care – PPO | Admitting: Physical Therapy

## 2020-04-29 ENCOUNTER — Other Ambulatory Visit: Payer: Self-pay

## 2020-04-29 DIAGNOSIS — M5416 Radiculopathy, lumbar region: Secondary | ICD-10-CM | POA: Diagnosis not present

## 2020-04-29 DIAGNOSIS — G35 Multiple sclerosis: Secondary | ICD-10-CM | POA: Diagnosis not present

## 2020-04-29 MED ORDER — GADOBENATE DIMEGLUMINE 529 MG/ML IV SOLN
15.0000 mL | Freq: Once | INTRAVENOUS | Status: AC | PRN
  Administered 2020-04-29: 15 mL via INTRAVENOUS

## 2020-04-30 ENCOUNTER — Telehealth: Payer: Self-pay | Admitting: Neurology

## 2020-04-30 DIAGNOSIS — M5126 Other intervertebral disc displacement, lumbar region: Secondary | ICD-10-CM

## 2020-04-30 DIAGNOSIS — M5417 Radiculopathy, lumbosacral region: Secondary | ICD-10-CM

## 2020-04-30 NOTE — Telephone Encounter (Signed)
I called to go over the results of the MRI and got voicemail.  I left a message that we will try to reach him again later today.  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  Additionally there is multilevel degenerative change elsewhere but with less potential for nerve root compression.  Due to the size of the disc herniation and his symptoms, I would like him to see neurosurgery.  We will try to reach him again later today.

## 2020-04-30 NOTE — Telephone Encounter (Signed)
I spoke to Cameron [name] about the lumbar spine results.  He continues to have some weakness in the left leg and numbness in the thigh consistent with a left L3 radiculopathy.  Pain has improved some over the last week  Because of the size of the herniated disc and his weakness I would like him to see neurosurgery.   I also discussed that the MRI of the brain did not show any new MS lesions.

## 2020-04-30 NOTE — Telephone Encounter (Signed)
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 He did receive the vm & will not be available until after 1:45pm. Please call back.  Best contact: [phone]

## 2020-05-01 ENCOUNTER — Encounter: Payer: Self-pay | Admitting: Internal Medicine

## 2020-05-01 ENCOUNTER — Ambulatory Visit (INDEPENDENT_AMBULATORY_CARE_PROVIDER_SITE_OTHER): Payer: BC Managed Care – PPO | Admitting: Internal Medicine

## 2020-05-01 ENCOUNTER — Other Ambulatory Visit: Payer: Self-pay

## 2020-05-01 VITALS — BP 110/78 | HR 96 | Temp 97.1°F | Ht 70.25 in | Wt 163.0 lb

## 2020-05-01 DIAGNOSIS — Z1211 Encounter for screening for malignant neoplasm of colon: Secondary | ICD-10-CM | POA: Diagnosis not present

## 2020-05-01 DIAGNOSIS — M5126 Other intervertebral disc displacement, lumbar region: Secondary | ICD-10-CM | POA: Diagnosis not present

## 2020-05-01 DIAGNOSIS — Z0000 Encounter for general adult medical examination without abnormal findings: Secondary | ICD-10-CM | POA: Diagnosis not present

## 2020-05-01 DIAGNOSIS — G35 Multiple sclerosis: Secondary | ICD-10-CM | POA: Diagnosis not present

## 2020-05-01 NOTE — Progress Notes (Signed)
Subjective:    Patient ID: Cameron [name], male    DOB: [birth year], 65 y.o.   MRN: [id number]  HPI Here for physical This visit occurred during the SARS-CoV-2 public health emergency.  Safety protocols were in place, including screening questions prior to the visit, additional usage of staff PPE, and extensive cleaning of exam room while observing appropriate contact time as indicated for disinfecting solutions.   Still having a lot of back and leg pain---MRI showed L2-3 nerve compression Dr Felecia Shelling has referred to a neurosurgeon--consider minimally invasive procedure  MS is controlled fairly well No new lesions on MRI Tolerating the medication Trying to find the right amount of physical activity---has to be careful not to overdo it (not much aerobic work lately) Fatigue is much better since starting the modafinil  Current Outpatient Medications on File Prior to Visit  Medication Sig Dispense Refill  . esomeprazole (NEXIUM) 20 MG capsule Take 20 mg by mouth as needed.     . fluticasone (FLONASE) 50 MCG/ACT nasal spray Place 2 sprays into both nostrils daily. In each nostril 16 g 12  . hydrocortisone (ANUSOL-HC) 25 MG suppository UNWRAP AND INSERT 1 SUPPOSITORY INTO THE RECTUM 3 TIMES DAILY AS NEEDED 30 suppository 1  . ibuprofen (ADVIL,MOTRIN) 200 MG tablet Take 200-400 mg by mouth every 6 (six) hours as needed.    . modafinil (PROVIGIL) 200 MG tablet Take 1 tablet (200 mg total) by mouth daily. 30 tablet 5  . Teriflunomide (AUBAGIO) 14 MG TABS Take 1 tablet by mouth daily.    . cyclobenzaprine (FLEXERIL) 10 MG tablet Take 0.5-1 tablets (5-10 mg total) by mouth at bedtime. (Patient not taking: Reported on 05/01/2020) 30 tablet 2   No current facility-administered medications on file prior to visit.    Allergies  Allergen Reactions  . Citalopram Hydrobromide     REACTION: unspecified  . Penicillins Other (See Comments)    REACTION: questionable REACTION: questionable    Past  Medical History:  Diagnosis Date  . Abnormal MRI 2007   nonspecific white matter abnormalities  . Allergy   . Anxiety   . GERD (gastroesophageal reflux disease)   . History of kidney stones   . Hyperlipidemia   . Migraines    OCULAR    Past Surgical History:  Procedure Laterality Date  . CYSTOSCOPY/URETEROSCOPY/HOLMIUM LASER/STENT PLACEMENT Right 07/27/2018   Procedure: CYSTOSCOPY/URETEROSCOPY/HOLMIUM LASER/STENT PLACEMENT;  Surgeon: Abbie Sons, MD;  Location: [facility];  Service: Urology;  Laterality: Right;  . URETEROLITHOTOMY  11/2001  . US ECHOCARDIOGRAPHY  11/2004   normal, Carotids normal    Family History  Problem Relation Age of Onset  . Diabetes Father   . Heart disease Father   . Dementia Father   . Dementia Paternal Grandmother   . CAD Neg Hx   . Hypertension Neg Hx   . Cancer Neg Hx     Social History   Socioeconomic History  . Marital status: Married    Spouse name: Not on file  . Number of children: Not on file  . Years of education: Not on file  . Highest education level: Not on file  Occupational History  . 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 Smoking status: Never Smoker  . Smokeless tobacco: Never Used  Vaping Use  . Vaping Use: Never used  Substance and Sexual Activity  . Alcohol use: Yes    Alcohol/week: 0.0 standard drinks  Comment: rare- once a month  . Drug use: No  . Sexual activity: Not on file  Other Topics Concern  . Not on file  Social History Narrative   Lives   Caffeine use:    Long standing relationship with partner--finally able to marry   Social Determinants of Health   Financial Resource Strain: Not on file  Food Insecurity: Not on file  Transportation Needs: Not on file  Physical Activity: Not on file  Stress: Not on file  Social Connections: Not on file  Intimate Partner Violence: Not on file   Review of Systems  Constitutional: Positive for  fatigue.       Wears seat belt Appetite down some and lost a few pounds  HENT: Negative for hearing loss.        Some sense of hearing blood flow in head Keeps up with dentist  Eyes: Negative for visual disturbance.       No unilateral vision loss or diplopia  Respiratory: Negative for cough, chest tightness and shortness of breath.   Cardiovascular: Negative for chest pain, palpitations and leg swelling.  Gastrointestinal: Negative for blood in stool and constipation.       Hernia somewhat more apparent---not ready to deal with this Bowels better with bran buds and miralax No heartburn  Endocrine: Negative for polydipsia and polyuria.  Genitourinary: Negative for difficulty urinating and urgency.       Nocturia is actually better No sexual problems  Musculoskeletal: Positive for myalgias. Negative for arthralgias, back pain and joint swelling.  Skin: Negative for rash.  Allergic/Immunologic: Positive for environmental allergies. Negative for immunocompromised state.       More sensitive to pollen since COVID vaccines  Neurological: Negative for dizziness, syncope and headaches.       Some neuropathy in feet---massager and better shoes have helped  Hematological: Negative for adenopathy. Does not bruise/bleed easily.  Psychiatric/Behavioral: Negative for dysphoric mood. The patient is not nervous/anxious.        Sleep is variable---affected by back and leg pain       Objective:   Physical Exam Constitutional:      Appearance: Normal appearance.  HENT:     Right Ear: Tympanic membrane, ear canal and external ear normal.     Left Ear: Tympanic membrane, ear canal and external ear normal.     Mouth/Throat:     Pharynx: No oropharyngeal exudate or posterior oropharyngeal erythema.  Eyes:     Conjunctiva/sclera: Conjunctivae normal.     Pupils: Pupils are equal, round, and reactive to light.  Cardiovascular:     Rate and Rhythm: Normal rate and regular rhythm.     Pulses:  Normal pulses.     Heart sounds: No murmur heard. No gallop.   Pulmonary:     Effort: Pulmonary effort is normal.     Breath sounds: Normal breath sounds. No wheezing or rales.  Abdominal:     Palpations: Abdomen is soft.     Tenderness: There is no abdominal tenderness.     Comments: Small left inguinal hernia--not into groin  Musculoskeletal:     Cervical back: Neck supple.     Left lower leg: No edema.  Lymphadenopathy:     Cervical: No cervical adenopathy.  Skin:    General: Skin is warm.     Findings: No rash.  Neurological:     Mental Status: He is alert and oriented to person, place, and time.     Comments: Left leg weakness  Psychiatric:        Mood and Affect: Mood normal.        Behavior: Behavior normal.            Assessment & Plan:

## 2020-05-01 NOTE — Assessment & Plan Note (Signed)
Is going to neurosurgeon

## 2020-05-01 NOTE — Assessment & Plan Note (Signed)
Doing okay Had COVID booster and flu vaccine Pneumonia vaccines when 66 Trying to exercise Defer PSA this year FIT

## 2020-05-01 NOTE — Assessment & Plan Note (Signed)
Doing fairly well with his Rx and the modafinil for fatigue

## 2020-05-06 ENCOUNTER — Encounter: Payer: Self-pay | Admitting: Physical Therapy

## 2020-05-06 ENCOUNTER — Other Ambulatory Visit: Payer: Self-pay

## 2020-05-06 ENCOUNTER — Ambulatory Visit: Payer: BC Managed Care – PPO | Admitting: Physical Therapy

## 2020-05-06 DIAGNOSIS — M5442 Lumbago with sciatica, left side: Secondary | ICD-10-CM

## 2020-05-06 DIAGNOSIS — R2689 Other abnormalities of gait and mobility: Secondary | ICD-10-CM

## 2020-05-06 DIAGNOSIS — R2681 Unsteadiness on feet: Secondary | ICD-10-CM

## 2020-05-06 DIAGNOSIS — M6281 Muscle weakness (generalized): Secondary | ICD-10-CM

## 2020-05-06 DIAGNOSIS — R293 Abnormal posture: Secondary | ICD-10-CM

## 2020-05-06 NOTE — Therapy (Signed)
[facility] [address] [place], [place], [address] Phone: [phone]   Fax:  [phone]  Physical Therapy Treatment  Patient Details  Name: Cameron [name] MRN: [id number] Date of Birth: 1955-07-27 Referring Provider (PT): Arlice Colt   Encounter Date: 05/06/2020   PT End of Session - 05/06/20 0935    Visit Number 4    Number of Visits 10    Authorization Type BCBS    PT Start Time 5102    PT Stop Time 5852    PT Time Calculation (min) 44 min    Activity Tolerance Patient tolerated treatment well;No increased pain    Behavior During Therapy [facility]FL for tasks assessed/performed           Past Medical History:  Diagnosis Date  . Abnormal MRI 2007   nonspecific white matter abnormalities  . Allergy   . Anxiety   . GERD (gastroesophageal reflux disease)   . History of kidney stones   . Hyperlipidemia   . Migraines    OCULAR    Past Surgical History:  Procedure Laterality Date  . CYSTOSCOPY/URETEROSCOPY/HOLMIUM LASER/STENT PLACEMENT Right 07/27/2018   Procedure: CYSTOSCOPY/URETEROSCOPY/HOLMIUM LASER/STENT PLACEMENT;  Surgeon: Abbie Sons, MD;  Location: [facility] ORS;  Service: Urology;  Laterality: Right;  . URETEROLITHOTOMY  11/2001  . US ECHOCARDIOGRAPHY  11/2004   normal, Carotids normal    There were no vitals filed for this visit.   Subjective Assessment - 05/06/20 0932    Subjective Had an MRI done which showed a significant disc buldge. Dr. Felecia Shelling is referring him to a surgeon. Has made the modifications at work and home to assist with better posture/positioning. Modified ex's are going well with no issues.    Pertinent History MS dx 2021    Patient Stated Goals Improved flexibility, improved muscle mass and stamina; core strength; balance    Currently in Pain? Yes    Pain Score 4     Pain Location Back    Pain Orientation Right;Lower    Pain Descriptors / Indicators Dull;Aching    Pain Type  Acute pain    Pain Radiating Towards now radiating down to just past his left knee    Pain Onset 1 to 4 weeks ago    Pain Frequency Constant    Aggravating Factors  prolonged standing    Pain Relieving Factors lidocaine patch, ibruprofen, massage gun                   [facility] Adult PT Treatment/Exercise - 05/06/20 0952      Exercises   Exercises Other Exercises    Other Exercises  seated on pball: bouncing x1 minute, pelvic rocking ant/post, then laterally for 10 reps each. Pelvic circles for 10 reps in pain free ranges both ways. all ex's needed cues for abd bracing/posture and ex form. then with red theraband- 3 way pulls, then rows, then shoulder extension all for 10 reps each. cues on posutre and ex form, along with cues to maintain abd bracing.      Lumbar Exercises: Stretches   Active Hamstring Stretch Right;Left;3 reps;30 seconds;Limitations    Active Hamstring Stretch Limitations seated at edge of mat with cues on technique/hold times.    Other Lumbar Stretch Exercise passive stetching of lumbar spine via distraction through LE's for ~15 sec holds for 5 reps, then with sheet at pelvis with manual pull for lumbar stretching for 30 sec's x 5 reps.    Other Lumbar Stretch  Exercise supine left single knee to chest stretch for 30 sec's x 3 reps with cues for hold times.      Lumbar Exercises: Supine   Pelvic Tilt 10 reps;5 seconds;Limitations    Bridge 10 reps;Non-compliant;5 seconds;Limitations    Bridge Limitations on mat with cues for abd bracing and hold time.                       PT Long Term Goals - 04/24/20 0909      PT LONG TERM GOAL #1   Title Pt will be independent with HEP for improved functional mobility, strength, balance, and decreased pain.  TARGET 05/22/2020    Time 5    Period Weeks    Status New      PT LONG TERM GOAL #2   Title Pt will verbalize at least 3 means to decrease low back pain for improved functional mobility.    Time 5     Period Weeks    Status New      PT LONG TERM GOAL #3   Title Pt will rate improvement in Oswestry Disability Index to less than or equal to 20% disability, to demonstrate less pain with functional activities.    Time 5    Period Weeks    Status New      PT LONG TERM GOAL #4   Title FGA score to improve to 29/30, to indicate improved balance in setting with eyes closed or decreased visual input.    Time 5    Period Weeks    Status Revised      PT LONG TERM GOAL #5   Title Pt will verbalize understanding of energy conservation and fall prevention education.    Time 5    Period Weeks    Status New                 Plan - 05/06/20 0935    Clinical Impression Statement Today's skilled session continued to foucs on lumbar stretching and core stabilizaiton/strengthening with no issues noted or reported in session. The pt is progressing and should benefit from continued PT to progress toward unmet goals pending what Neurosurgeon says at his consult.    Personal Factors and Comorbidities Comorbidity 3+    Comorbidities Anxiety, GERD, hx of ocular migraines, hx of kidney stones    Examination-Activity Limitations Locomotion Level;Transfers;Sit;Stand;Stairs    Examination-Participation Restrictions Occupation;Community Activity;Other   Community fitness at gym   Stability/Clinical Decision Making Evolving/Moderate complexity    Rehab Potential Good    PT Frequency 2x / week   1x/wk after eval, then   PT Duration Other (comment)   for 4 weeks; total POC = 5 weeks   PT Treatment/Interventions ADLs/Self Care Home Management;Electrical Stimulation;Neuromuscular re-education;Balance training;Therapeutic exercise;Therapeutic activities;Functional mobility training;Ultrasound;Moist Heat;Traction;Gait training;Stair training;Patient/family education;Manual techniques;Energy conservation;Passive range of motion    PT Next Visit Plan trunk and core stability/strengthening, follow up on postural  re-education, especially for standing prolonged periods; work in pain-free ranges for low back stability, try distraction as needed;  work on balance exercises    Consulted and Agree with Plan of Care Patient           Patient will benefit from skilled therapeutic intervention in order to improve the following deficits and impairments:  Abnormal gait,Decreased range of motion,Difficulty walking,Pain,Decreased balance,Impaired flexibility,Decreased mobility,Decreased strength,Postural dysfunction  Visit Diagnosis: Muscle weakness (generalized)  Acute bilateral low back pain with left-sided sciatica  Unsteadiness on feet  Abnormal  posture  Other abnormalities of gait and mobility     Problem List Patient Active Problem List   Diagnosis Date Noted  . 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 Lower urinary tract symptoms (LUTS) 04/01/2020  . Low vitamin D level 02/20/2020  . High risk medication use 11/04/2019  . Vitamin D deficiency 11/04/2019  . Left foot drop 11/04/2019  . Vision disturbance 11/04/2019  . Balance disorder 09/24/2019  . Abnormal MRI of head 09/24/2019  . History of nephrolithiasis 03/07/2019  . Left inguinal hernia 03/07/2019  . Ureteral calculus 07/25/2018  . Hydronephrosis with urinary obstruction due to ureteral calculus 07/25/2018  . Anxiety 12/22/2017  . Low back pain 05/29/2014  . Routine general medical examination at a health care facility 02/23/2012  . GERD (gastroesophageal reflux disease)   . Hyperlipemia 10/03/2007  . Seasonal allergic rhinitis due to pollen 07/19/2006  . RENAL CALCULUS 07/19/2006  . Multiple sclerosis ([place]) 10/16/2005    Willow Ora, PTA, [facility] [address] [place], [place] [address] [phone] 05/06/20, 4:56 PM   Name: Cameron [name] MRN: [id number] Date of Birth: 1955-12-29

## 2020-05-08 ENCOUNTER — Ambulatory Visit: Payer: BC Managed Care – PPO | Admitting: Physical Therapy

## 2020-05-11 ENCOUNTER — Other Ambulatory Visit: Payer: Self-pay

## 2020-05-11 ENCOUNTER — Ambulatory Visit: Payer: BC Managed Care – PPO | Admitting: Physical Therapy

## 2020-05-11 DIAGNOSIS — M6281 Muscle weakness (generalized): Secondary | ICD-10-CM

## 2020-05-11 DIAGNOSIS — M5442 Lumbago with sciatica, left side: Secondary | ICD-10-CM

## 2020-05-11 DIAGNOSIS — R2689 Other abnormalities of gait and mobility: Secondary | ICD-10-CM | POA: Diagnosis not present

## 2020-05-11 NOTE — Therapy (Signed)
[facility] [address] [place], [place], [address] Phone: [phone]   Fax:  [phone]  Physical Therapy Treatment  Patient Details  Name: Cameron [name] MRN: [id number] Date of Birth: 11/18/1955 Referring Provider (PT): Arlice Colt   Encounter Date: 05/11/2020   PT End of Session - 05/11/20 1506    Visit Number 5    Number of Visits 10    Authorization Type BCBS    PT Start Time 0802    PT Stop Time 0846    PT Time Calculation (min) 44 min    Activity Tolerance Patient tolerated treatment well;No increased pain   Pt reports less numbness in L thigh and no groin pain at end of session   Behavior During Therapy [facility] for tasks assessed/performed           Past Medical History:  Diagnosis Date  . Abnormal MRI 2007   nonspecific white matter abnormalities  . Allergy   . Anxiety   . GERD (gastroesophageal reflux disease)   . History of kidney stones   . Hyperlipidemia   . Migraines    OCULAR    Past Surgical History:  Procedure Laterality Date  . CYSTOSCOPY/URETEROSCOPY/HOLMIUM LASER/STENT PLACEMENT Right 07/27/2018   Procedure: CYSTOSCOPY/URETEROSCOPY/HOLMIUM LASER/STENT PLACEMENT;  Surgeon: Abbie Sons, MD;  Location: [facility] ORS;  Service: Urology;  Laterality: Right;  . URETEROLITHOTOMY  11/2001  . US ECHOCARDIOGRAPHY  11/2004   normal, Carotids normal    There were no vitals filed for this visit.   Subjective Assessment - 05/11/20 0805    Subjective Met with neurosurgeon Friday and he's recommending injection as a means to treat the disc bulge.  Also with the R hip, he may do a separate injection after the initial for the back.    Pertinent History MS dx 2021    Patient Stated Goals Improved flexibility, improved muscle mass and stamina; core strength; balance    Currently in Pain? Yes    Pain Score 3     Pain Location Back    Pain Orientation Right;Lower;Left    Pain Descriptors /  Indicators Aching;Dull    Pain Type Acute pain    Pain Radiating Towards numbness in L anterior thigh; pain and numbness L groin area    Pain Onset 1 to 4 weeks ago    Pain Frequency Constant    Aggravating Factors  prolonged standing    Pain Relieving Factors lidocaine patch, ibuprofen, massage gun                             [facility] Adult PT Treatment/Exercise - 05/11/20 0001      Exercises   Exercises Other Exercises    Other Exercises  seated on large blue physioball: pelvic rocking ant/post, then laterally for 10 reps each. all ex's needed cues for abd bracing/posture and ex form. Then with abominal bracing, using red theraband- 3 way pulls, then rows, all for 10 reps each. cues on posutre and ex form, along with cues to maintain abd bracing.      Lumbar Exercises: Stretches   Passive Hamstring Stretch Left;3 reps;30 seconds    Passive Hamstring Stretch Limitations Also performed passive SLR stretch, LLE in supine, 3 reps 10 seconds, with increased numbness noted L thigh on 3rd rep with increased SLR height    Single Knee to Chest Stretch Right;Left;3 reps;30 seconds    Other Lumbar Stretch Exercise passive stetching  of lumbar spine via distraction through LE's for ~15 sec holds for 5 reps, then 3 additional reps LLE, 15 sec at end of session.  Additional manual distraction with sheet at pelvis with manual pull for lumbar stretching for 30 sec's x 5 reps.      Lumbar Exercises: Supine   Pelvic Tilt 10 reps;5 seconds;Limitations    Bridge 10 reps;Non-compliant;5 seconds;Limitations    Bridge Limitations on mat with cues for abd bracing and hold time.      Lumbar Exercises: Quadruped   Madcat/Old Horse 5 reps    Madcat/Old Horse Limitations Tactile cues for technique    Single Arm Raise Left;Right;5 reps;3 seconds    Single Arm Raises Limitations Cues for abdominal activation and neutral spine                  PT Education - 05/11/20 1505    Education  Details Verbal/demo instruction of cat/camel exercise in quadruped (pt has done this before and feel comfortable to add back to routine)    Person(s) Educated Patient    Methods Explanation;Demonstration    Comprehension Verbalized understanding;Returned demonstration               PT Long Term Goals - 04/24/20 0909      PT LONG TERM GOAL #1   Title Pt will be independent with HEP for improved functional mobility, strength, balance, and decreased pain.  TARGET 05/22/2020    Time 5    Period Weeks    Status New      PT LONG TERM GOAL #2   Title Pt will verbalize at least 3 means to decrease low back pain for improved functional mobility.    Time 5    Period Weeks    Status New      PT LONG TERM GOAL #3   Title Pt will rate improvement in Oswestry Disability Index to less than or equal to 20% disability, to demonstrate less pain with functional activities.    Time 5    Period Weeks    Status New      PT LONG TERM GOAL #4   Title FGA score to improve to 29/30, to indicate improved balance in setting with eyes closed or decreased visual input.    Time 5    Period Weeks    Status Revised      PT LONG TERM GOAL #5   Title Pt will verbalize understanding of energy conservation and fall prevention education.    Time 5    Period Weeks    Status New                 Plan - 05/11/20 1507    Clinical Impression Statement Skilled PT session continue to address lumbar stretch and manual distraction techniques as well as core stabilization.  Pt reports decreased pain and decreased numbness in thigh area at end of session.  Plan to continue to address per POC, as neurosurgeon will be planning injection to lumbar area to address herniate disc.  Plan to continue towards LTGs.    Personal Factors and Comorbidities Comorbidity 3+    Comorbidities Anxiety, GERD, hx of ocular migraines, hx of kidney stones    Examination-Activity Limitations Locomotion Level;Transfers;Sit;Stand;Stairs     Examination-Participation Restrictions Occupation;Community Activity;Other   Community fitness at gym   Stability/Clinical Decision Making Evolving/Moderate complexity    Rehab Potential Good    PT Frequency 2x / week   1x/wk after eval, then  PT Duration Other (comment)   for 4 weeks; total POC = 5 weeks   PT Treatment/Interventions ADLs/Self Care Home Management;Electrical Stimulation;Neuromuscular re-education;Balance training;Therapeutic exercise;Therapeutic activities;Functional mobility training;Ultrasound;Moist Heat;Traction;Gait training;Stair training;Patient/family education;Manual techniques;Energy conservation;Passive range of motion    PT Next Visit Plan trunk and core stability/strengthening, manual distraction (try to use belt/strap to assist), lumbar extension stretch with towel; continue to work in pan-free ranges for low back stability; balance exercises as needed.  *Pt's POC ends next week-I will discuss with him at the beginning of next week (1/31) about extended POC if needed-    Consulted and Agree with Plan of Care Patient           Patient will benefit from skilled therapeutic intervention in order to improve the following deficits and impairments:  Abnormal gait,Decreased range of motion,Difficulty walking,Pain,Decreased balance,Impaired flexibility,Decreased mobility,Decreased strength,Postural dysfunction  Visit Diagnosis: Acute bilateral low back pain with left-sided sciatica  Muscle weakness (generalized)     Problem List Patient Active Problem List   Diagnosis Date Noted  . 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 Lower urinary tract symptoms (LUTS) 04/01/2020  . Low vitamin D level 02/20/2020  . High risk medication use 11/04/2019  . Vitamin D deficiency 11/04/2019  . Left foot drop 11/04/2019  . Vision disturbance 11/04/2019  . Balance disorder 09/24/2019  . Abnormal MRI of head 09/24/2019  . History of nephrolithiasis 03/07/2019  . Left inguinal  hernia 03/07/2019  . Ureteral calculus 07/25/2018  . Hydronephrosis with urinary obstruction due to ureteral calculus 07/25/2018  . Anxiety 12/22/2017  . Low back pain 05/29/2014  . Routine general medical examination at a health care facility 02/23/2012  . GERD (gastroesophageal reflux disease)   . Hyperlipemia 10/03/2007  . Seasonal allergic rhinitis due to pollen 07/19/2006  . RENAL CALCULUS 07/19/2006  . Multiple sclerosis ([place]) 10/16/2005    Eria Lozoya W. 05/11/2020, 3:13 PM Frazier Butt., PT  [facility] [address] [place], [place], [address] Phone: [phone]   Fax:  [phone]  Name: Cameron [name] MRN: [id number] Date of Birth: [birth year]

## 2020-05-13 ENCOUNTER — Other Ambulatory Visit: Payer: Self-pay | Admitting: Neurology

## 2020-05-13 ENCOUNTER — Ambulatory Visit: Payer: BC Managed Care – PPO | Admitting: Physical Therapy

## 2020-05-13 ENCOUNTER — Encounter: Payer: Self-pay | Admitting: Physical Therapy

## 2020-05-13 ENCOUNTER — Other Ambulatory Visit: Payer: Self-pay

## 2020-05-13 DIAGNOSIS — R293 Abnormal posture: Secondary | ICD-10-CM

## 2020-05-13 DIAGNOSIS — M5442 Lumbago with sciatica, left side: Secondary | ICD-10-CM

## 2020-05-13 DIAGNOSIS — R2689 Other abnormalities of gait and mobility: Secondary | ICD-10-CM | POA: Diagnosis not present

## 2020-05-13 DIAGNOSIS — M6281 Muscle weakness (generalized): Secondary | ICD-10-CM

## 2020-05-13 MED ORDER — GABAPENTIN 100 MG PO CAPS: ORAL_CAPSULE | ORAL | 3 refills | Status: DC

## 2020-05-13 NOTE — Therapy (Signed)
[facility] [address] [place], [place], [address] Phone: [phone]   Fax:  [phone]  Physical Therapy Treatment  Patient Details  Name: Cameron [name] MRN: [id number] Date of Birth: 1956-02-11 Referring Provider (PT): Arlice Colt   Encounter Date: 05/13/2020   PT End of Session - 05/13/20 0855    Visit Number 6    Number of Visits 10    Authorization Type BCBS    PT Start Time 0850    PT Stop Time 0930    PT Time Calculation (min) 40 min    Activity Tolerance Patient tolerated treatment well;No increased pain   Pt reports less numbness in L thigh and no groin pain at end of session   Behavior During Therapy [facility] for tasks assessed/performed           Past Medical History:  Diagnosis Date  . Abnormal MRI 2007   nonspecific white matter abnormalities  . Allergy   . Anxiety   . GERD (gastroesophageal reflux disease)   . History of kidney stones   . Hyperlipidemia   . Migraines    OCULAR    Past Surgical History:  Procedure Laterality Date  . CYSTOSCOPY/URETEROSCOPY/HOLMIUM LASER/STENT PLACEMENT Right 07/27/2018   Procedure: CYSTOSCOPY/URETEROSCOPY/HOLMIUM LASER/STENT PLACEMENT;  Surgeon: Abbie Sons, MD;  Location: [facility] ORS;  Service: Urology;  Laterality: Right;  . URETEROLITHOTOMY  11/2001  . US ECHOCARDIOGRAPHY  11/2004   normal, Carotids normal    There were no vitals filed for this visit.   Subjective Assessment - 05/13/20 0851    Subjective Messaged neurologist about neuropathy in feet that has been getting progressively worse. Now keeping him up all night for the last month. His spouse takes Gabapentin for this type of pain. So he asked MD about this and it has been prescribed, has not picked it up as yet. No other changes. Missed the call from Neurosurgeon to set up injections, plans to call back today. Did not have time to stretch this am, nor has he been able to do ex's for past two  days due to long 9-12 hour working days.    Pertinent History MS dx 2021    Patient Stated Goals Improved flexibility, improved muscle mass and stamina; core [name]; balance    Currently in Pain? Yes    Pain Score 3     Pain Location Back    Pain Orientation Right;Left;Lower    Pain Descriptors / Indicators Aching;Dull    Pain Type Acute pain    Pain Onset More than a month ago    Pain Frequency Constant    Aggravating Factors  prolonged standing    Pain Relieving Factors lidocain patch, Ibuprofen, massage gun, will be starting Gabapentin for pain as well                  [facility] Adult PT Treatment/Exercise - 05/13/20 0857      Transfers   Transfers Sit to Stand;Stand to Sit    Sit to Stand 7: Independent;Without upper extremity assist;From chair/3-in-1    Stand to Sit 7: Independent;Without upper extremity assist;To chair/3-in-1      Ambulation/Gait   Ambulation/Gait Yes    Ambulation/Gait Assistance 6: Modified independent (Device/Increase time)    Assistive device None    Gait Pattern Step-through pattern;Wide base of support    Ambulation Surface Level;Indoor      Exercises   Exercises Other Exercises    Other Exercises  seated on large  blue p-ball: bouncing for ~1 minute with emphasis on tall posture and abdominal bracing; then pt performed pelvic rocks ant/post, then laterally for ~10 reps each, followed by small pelvic circles both ways for ~10 reps each way. continued cues needed for posture and abdominal bracing. with red theraband- 3 way pulls for 10 reps each way with continued cues for abdominal bracing and posture.      Lumbar Exercises: Stretches   Passive Hamstring Stretch Right;Left;3 reps;30 seconds;Limitations    Passive Hamstring Stretch Limitations pt supine on mat table with increased range noted with each rep.    Single Knee to Chest Stretch Right;Left;3 reps;30 seconds    Single Knee to Chest Stretch Limitations 3 reps on each side.    Other Lumbar  Stretch Exercise passive stetching of lumbar spine via distraction through LE's for ~30 sec holds for 5 reps,  Additional manual stretching with sheet at pelvis with manual pull for lumbar stretching for 30 sec's x 5 reps.    Other Lumbar Stretch Exercise cat<>camel with pt arching for cat to neutral spine only for 10 reps. childs pose stretching for 30 sec holds for 3 reps.      Lumbar Exercises: Supine   Pelvic Tilt 10 reps;5 seconds;Limitations    Pelvic Tilt Limitations cues for hold times                PT Long Term Goals - 04/24/20 0909      PT LONG TERM GOAL #1   Title Pt will be independent with HEP for improved functional mobility, [name], balance, and decreased pain.  TARGET 05/22/2020    Time 5    Period Weeks    Status New      PT LONG TERM GOAL #2   Title Pt will verbalize at least 3 means to decrease low back pain for improved functional mobility.    Time 5    Period Weeks    Status New      PT LONG TERM GOAL #3   Title Pt will rate improvement in Oswestry Disability Index to less than or equal to 20% disability, to demonstrate less pain with functional activities.    Time 5    Period Weeks    Status New      PT LONG TERM GOAL #4   Title FGA score to improve to 29/30, to indicate improved balance in setting with eyes closed or decreased visual input.    Time 5    Period Weeks    Status Revised      PT LONG TERM GOAL #5   Title Pt will verbalize understanding of energy conservation and fall prevention education.    Time 5    Period Weeks    Status New                 Plan - 05/13/20 0855    Clinical Impression Statement Today's skilled session continued to focus on core stabilization, lumbar stretching with exercises and manual stretching. Decreased pain/radicular symptoms reproted after session. The pt is progressing toward goals and should benefit from continued PT to progress toward unmet goals.    Personal Factors and Comorbidities  Comorbidity 3+    Comorbidities Anxiety, GERD, hx of ocular migraines, hx of kidney stones    Examination-Activity Limitations Locomotion Level;Transfers;Sit;Stand;Stairs    Examination-Participation Restrictions Occupation;Community Activity;Other   Community fitness at gym   Stability/Clinical Decision Making Evolving/Moderate complexity    Rehab Potential Good    PT Frequency  2x / week   1x/wk after eval, then   PT Duration Other (comment)   for 4 weeks; total POC = 5 weeks   PT Treatment/Interventions ADLs/Self Care Home Management;Electrical Stimulation;Neuromuscular re-education;Balance training;Therapeutic exercise;Therapeutic activities;Functional mobility training;Ultrasound;Moist Heat;Traction;Gait training;Stair training;Patient/family education;Manual techniques;Energy conservation;Passive range of motion    PT Next Visit Plan trunk and core stability/strengthening, manual distraction (pt perfers sheet over belt/straps) lumbar extension stretch with towel; continue to work in pan-free ranges for low back stability; balance exercises as needed.  *Pt's POC ends next week-I will discuss with him at the beginning of next week (1/31) about extended POC if needed-    Consulted and Agree with Plan of Care Patient           Patient will benefit from skilled therapeutic intervention in order to improve the following deficits and impairments:  Abnormal gait,Decreased range of motion,Difficulty walking,Pain,Decreased balance,Impaired flexibility,Decreased mobility,Decreased [name],Postural dysfunction  Visit Diagnosis: Muscle weakness (generalized)  Abnormal posture  Acute bilateral low back pain with left-sided sciatica     Problem List Patient Active Problem List   Diagnosis Date Noted  . 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 Lower urinary tract symptoms (LUTS) 04/01/2020  . Low vitamin D level 02/20/2020  . High risk medication use 11/04/2019  . Vitamin D deficiency 11/04/2019   . Left foot drop 11/04/2019  . Vision disturbance 11/04/2019  . Balance disorder 09/24/2019  . Abnormal MRI of head 09/24/2019  . History of nephrolithiasis 03/07/2019  . Left inguinal hernia 03/07/2019  . Ureteral calculus 07/25/2018  . Hydronephrosis with urinary obstruction due to ureteral calculus 07/25/2018  . Anxiety 12/22/2017  . Low back pain 05/29/2014  . Routine general medical examination at a health care facility 02/23/2012  . GERD (gastroesophageal reflux disease)   . Hyperlipemia 10/03/2007  . Seasonal allergic rhinitis due to pollen 07/19/2006  . RENAL CALCULUS 07/19/2006  . Multiple sclerosis ([place]) 10/16/2005    Willow Ora, PTA, [facility] [address] [place], [place] [address] [phone] 05/13/20, 9:05 PM   Name: Cameron [name] MRN: [id number] Date of Birth: March 02, 1956

## 2020-05-15 ENCOUNTER — Ambulatory Visit: Payer: BC Managed Care – PPO | Admitting: Physical Therapy

## 2020-05-18 ENCOUNTER — Telehealth: Payer: Self-pay | Admitting: Neurology

## 2020-05-18 ENCOUNTER — Encounter: Payer: Self-pay | Admitting: Physical Therapy

## 2020-05-18 ENCOUNTER — Other Ambulatory Visit: Payer: Self-pay | Admitting: *Deleted

## 2020-05-18 ENCOUNTER — Other Ambulatory Visit (INDEPENDENT_AMBULATORY_CARE_PROVIDER_SITE_OTHER): Payer: Self-pay

## 2020-05-18 ENCOUNTER — Other Ambulatory Visit: Payer: Self-pay

## 2020-05-18 ENCOUNTER — Ambulatory Visit: Payer: BC Managed Care – PPO | Admitting: Physical Therapy

## 2020-05-18 DIAGNOSIS — R293 Abnormal posture: Secondary | ICD-10-CM

## 2020-05-18 DIAGNOSIS — G35 Multiple sclerosis: Secondary | ICD-10-CM

## 2020-05-18 DIAGNOSIS — R2689 Other abnormalities of gait and mobility: Secondary | ICD-10-CM | POA: Diagnosis not present

## 2020-05-18 DIAGNOSIS — M6281 Muscle weakness (generalized): Secondary | ICD-10-CM

## 2020-05-18 DIAGNOSIS — Z79899 Other long term (current) drug therapy: Secondary | ICD-10-CM

## 2020-05-18 DIAGNOSIS — R2681 Unsteadiness on feet: Secondary | ICD-10-CM

## 2020-05-18 DIAGNOSIS — M5442 Lumbago with sciatica, left side: Secondary | ICD-10-CM

## 2020-05-18 DIAGNOSIS — Z0289 Encounter for other administrative examinations: Secondary | ICD-10-CM

## 2020-05-18 NOTE — Patient Instructions (Signed)
Access Code: [email or web address] URL: [email or web address] Date: 05/18/2020 Prepared by: Misty Stanley  Exercises Supine Bridge - 1 x daily - 7 x weekly - 1-2 sets - 10 reps - 3 sec hold Small Range Straight Leg Raise - 1 x daily - 7 x weekly - 1-2 sets - 10 reps Seated Hamstring Stretch - 1 x daily - 7 x weekly - 1 sets - 3 reps - 30 sec hold Slow Controlled Bounce on Swiss Ball - 1 x daily - 5 x weekly - 2 sets - 1 minute hold Pelvic Tilt on Swiss Ball - 1 x daily - 7 x weekly - 2 sets - 10 reps Seated Lateral Pelvic Tilt on Swiss Ball - 1 x daily - 7 x weekly - 2 sets - 10 reps Pelvic Circles on [employer] - 1 x daily - 7 x weekly - 2 sets - 10 reps   Provided patient with postural correction/decompression exercises in supine:  -shoulder press x 5 reps  -neck press x 5 reps (verbalized understanding)  -leg press x 5 reps  -leg lengthener x 5 reps  Physioball: 85 CM

## 2020-05-18 NOTE — Telephone Encounter (Signed)
Pt came in for blood work today and requested cb from nurse going over how much longer he will need to come in to have blood drawn. Please advise.

## 2020-05-18 NOTE — Telephone Encounter (Signed)
Called pt back. Advised today should be his last monthly lab drawn since starting on Aubagio (only needs monthlyx6 months). As long as thses results come back normal, we can cx appt he made in Feb. He verbalized understanding.

## 2020-05-18 NOTE — Therapy (Signed)
[facility] [address] [place], [place], [address] Phone: [phone]   Fax:  [phone]  Physical Therapy Treatment  Patient Details  Name: Cameron [name] MRN: [id number] Date of Birth: Oct 27, 1955 Referring Provider (PT): Arlice Colt   Encounter Date: 05/18/2020   PT End of Session - 05/18/20 0932    Visit Number 7    Number of Visits 10    Date for PT Re-Evaluation 05/22/20    Authorization Type BCBS    PT Start Time [phone]    PT Stop Time 0930    PT Time Calculation (min) 43 min    Equipment Utilized During Treatment Other (comment)   physioball   Activity Tolerance Patient tolerated treatment well;No increased pain   Pt reports less numbness in L thigh and no groin pain at end of session   Behavior During Therapy [facility] for tasks assessed/performed           Past Medical History:  Diagnosis Date  . Abnormal MRI 2007   nonspecific white matter abnormalities  . Allergy   . Anxiety   . GERD (gastroesophageal reflux disease)   . History of kidney stones   . Hyperlipidemia   . Migraines    OCULAR    Past Surgical History:  Procedure Laterality Date  . CYSTOSCOPY/URETEROSCOPY/HOLMIUM LASER/STENT PLACEMENT Right 07/27/2018   Procedure: CYSTOSCOPY/URETEROSCOPY/HOLMIUM LASER/STENT PLACEMENT;  Surgeon: Abbie Sons, MD;  Location: [facility] ORS;  Service: Urology;  Laterality: Right;  . URETEROLITHOTOMY  11/2001  . US ECHOCARDIOGRAPHY  11/2004   normal, Carotids normal    There were no vitals filed for this visit.   Subjective Assessment - 05/18/20 0851    Subjective Did a presentation in [place] on Friday so took a break from exercises this weekend; did exercises this morning.  Started Gabapentin but is only taking PRN, feels like it is working well.  Symptoms were not bad after traveling to and from [facility].   Pt feels like therapy is making a difference.  Likes the physioball and is planning on buying one but needs  pictures of the exercises.  First back steroid injection on February 10th.    Pertinent History MS dx 2021    Patient Stated Goals Improved flexibility, improved muscle mass and stamina; core strength; balance    Currently in Pain? Yes    Pain Score 1     Pain Location Back    Pain Orientation Lower    Pain Descriptors / Indicators Numbness    Pain Type Neuropathic pain    Pain Onset More than a month ago              [facility] PT Assessment - 05/18/20 0902      Assessment   Medical Diagnosis MS, herniated disc with L leg pain    Referring Provider (PT) Felecia Shelling, Richard    Onset Date/Surgical Date 04/01/20    Hand Dominance Right      Precautions   Precautions Fall    Precaution Comments Recent herniated disc; relieved significantly with Prednisone course; pt to have MRI      Prior Function   Level of Independence Independent    Vocation Full time employment    Vocation Requirements Professor of music; rehearsals; requires standing and walking; rehearsals can be about 2-3 hours    Leisure Enjoyed working out and reading, Landscape architect      Observation/Other Assessments   Other Surveys  Oswestry Disability Index  Oswestry Disability Index  22%; omitted lifting due to avoidance due to herniated disc and hernia      Functional Gait  Assessment   Gait assessed  Yes    Gait Level Surface Walks 20 ft in less than 5.5 sec, no assistive devices, good speed, no evidence for imbalance, normal gait pattern, deviates no more than 6 in outside of the 12 in walkway width.    Change in Gait Speed Able to smoothly change walking speed without loss of balance or gait deviation. Deviate no more than 6 in outside of the 12 in walkway width.    Gait with Horizontal Head Turns Performs head turns smoothly with slight change in gait velocity (eg, minor disruption to smooth gait path), deviates 6-10 in outside 12 in walkway width, or uses an assistive device.    Gait with Vertical Head  Turns Performs head turns with no change in gait. Deviates no more than 6 in outside 12 in walkway width.    Gait and Pivot Turn Pivot turns safely within 3 sec and stops quickly with no loss of balance.    Step Over Obstacle Is able to step over 2 stacked shoe boxes taped together (9 in total height) without changing gait speed. No evidence of imbalance.    Gait with Narrow Base of Support Is able to ambulate for 10 steps heel to toe with no staggering.    Gait with Eyes Closed Walks 20 ft, uses assistive device, slower speed, mild gait deviations, deviates 6-10 in outside 12 in walkway width. Ambulates 20 ft in less than 9 sec but greater than 7 sec.    Ambulating Backwards Walks 20 ft, no assistive devices, good speed, no evidence for imbalance, normal gait    Steps Alternating feet, no rail.    Total Score 28    FGA comment: 28/30                         [facility] Adult PT Treatment/Exercise - 05/18/20 1044      Therapeutic Activites    Therapeutic Activities Other Therapeutic Activities    Other Therapeutic Activities Discussed plan for therapy visits.  Will spend one more visit reviewing HEP and then will go on hold until after injections.  Pt to continue exercises and will let therapy know if he will need additional visits after injections or is ready to D/C.      Exercises   Exercises Other Exercises    Other Exercises  Reviewed what size physioball pt would need to purchase (85cm) and reviewed all physioball exercises except UE pulls.  Pt required tactile and visual cues to perform anterior/poster tilts with correct mm activation/timing/sequencing and pelvic circles.  Will review UE pulls at next session.           Access Code: [email or web address] URL: [email or web address] Date: 05/18/2020 Prepared by: Misty Stanley  Exercises Supine Bridge - 1 x daily - 7 x weekly - 1-2 sets - 10 reps - 3 sec hold Small Range Straight Leg Raise - 1 x daily - 7 x weekly - 1-2  sets - 10 reps Seated Hamstring Stretch - 1 x daily - 7 x weekly - 1 sets - 3 reps - 30 sec hold Slow Controlled Bounce on Swiss Ball - 1 x daily - 5 x weekly - 2 sets - 1 minute hold Pelvic Tilt on Swiss Ball - 1 x daily - 7 x weekly - 2 sets - 10  reps Seated Lateral Pelvic Tilt on Swiss Ball - 1 x daily - 7 x weekly - 2 sets - 10 reps Pelvic Circles on Swiss Ball - 1 x daily - 7 x weekly - 2 sets - 10 reps   Provided patient with postural correction/decompression exercises in supine:  -shoulder press x 5 reps  -neck press x 5 reps (verbalized understanding)  -leg press x 5 reps  -leg lengthener x 5 reps  Physioball: 85 CM         PT Education - 05/18/20 0932    Education Details progress towards goals, plan for PT visits, exercises with physioball and size of physioball needed    Person(s) Educated Patient    Methods Explanation;Demonstration    Comprehension Need further instruction               PT Long Term Goals - 05/18/20 0933      PT LONG TERM GOAL #1   Title Pt will be independent with HEP for improved functional mobility, strength, balance, and decreased pain.  TARGET 05/22/2020    Time 5    Period Weeks    Status On-going      PT LONG TERM GOAL #2   Title Pt will verbalize at least 3 means to decrease low back pain for improved functional mobility.    Time 5    Period Weeks    Status Achieved      PT LONG TERM GOAL #3   Title Pt will rate improvement in Oswestry Disability Index to less than or equal to 20% disability, to demonstrate less pain with functional activities.    Baseline decreased to 22% improved but not to goal    Time 5    Period Weeks    Status Partially Met      PT LONG TERM GOAL #4   Title FGA score to improve to 29/30, to indicate improved balance in setting with eyes closed or decreased visual input.    Baseline 28/30    Time 5    Period Weeks    Status Partially Met      PT LONG TERM GOAL #5   Title Pt will verbalize  understanding of energy conservation and fall prevention education.    Time 5    Period Weeks    Status Achieved                 Plan - 05/18/20 1047    Clinical Impression Statement Initiated assessment of progress towards LTG with pt making good progress and demonstrating improvement in most goals but only partially met Oswestry goal and FGA goal.  Pt has mainly been focusing on stretches and core stability training and does report reduction in symptoms and physical limitations but has not specifically address balance.  Pt plans to purchase physioball; began to review physioball pelvic tilt exercises and will continue to review at next session.  After next session pt to go on hold until after injections and then will determine if pt requires more visits or is ready for D/C.    Personal Factors and Comorbidities Comorbidity 3+    Comorbidities Anxiety, GERD, hx of ocular migraines, hx of kidney stones    Examination-Activity Limitations Locomotion Level;Transfers;Sit;Stand;Stairs    Examination-Participation Restrictions Occupation;Community Activity;Other   Community fitness at gym   Stability/Clinical Decision Making Evolving/Moderate complexity    Rehab Potential Good    PT Frequency 2x / week   1x/wk after eval, then   PT Duration  Other (comment)   for 4 weeks; total POC = 5 weeks   PT Treatment/Interventions ADLs/Self Care Home Management;Electrical Stimulation;Neuromuscular re-education;Balance training;Therapeutic exercise;Therapeutic activities;Functional mobility training;Ultrasound;Moist Heat;Traction;Gait training;Stair training;Patient/family education;Manual techniques;Energy conservation;Passive range of motion    PT Next Visit Plan Juliann Pulse - review UE exercises on physioball and add to medbridge; please give a few balance exercises including head turns/eyes closed.  Pt to go on hold until after injection and then will let us know if he needs more visits or is ready to D/C with  exercises.    Consulted and Agree with Plan of Care Patient           Patient will benefit from skilled therapeutic intervention in order to improve the following deficits and impairments:  Abnormal gait,Decreased range of motion,Difficulty walking,Pain,Decreased balance,Impaired flexibility,Decreased mobility,Decreased strength,Postural dysfunction  Visit Diagnosis: Muscle weakness (generalized)  Abnormal posture  Acute bilateral low back pain with left-sided sciatica  Unsteadiness on feet  Other abnormalities of gait and mobility     Problem List Patient Active Problem List   Diagnosis Date Noted  . 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 Lower urinary tract symptoms (LUTS) 04/01/2020  . Low vitamin D level 02/20/2020  . High risk medication use 11/04/2019  . Vitamin D deficiency 11/04/2019  . Left foot drop 11/04/2019  . Vision disturbance 11/04/2019  . Balance disorder 09/24/2019  . Abnormal MRI of head 09/24/2019  . History of nephrolithiasis 03/07/2019  . Left inguinal hernia 03/07/2019  . Ureteral calculus 07/25/2018  . Hydronephrosis with urinary obstruction due to ureteral calculus 07/25/2018  . Anxiety 12/22/2017  . Low back pain 05/29/2014  . Routine general medical examination at a health care facility 02/23/2012  . GERD (gastroesophageal reflux disease)   . Hyperlipemia 10/03/2007  . Seasonal allergic rhinitis due to pollen 07/19/2006  . RENAL CALCULUS 07/19/2006  . Multiple sclerosis ([place]) 10/16/2005    Rico Junker, PT, DPT 05/18/20    10:51 AM    [facility] [address] [place], [place], [address] Phone: [phone]   Fax:  [phone]  Name: Cameron [name] MRN: [id number] Date of Birth: 06-16-1955

## 2020-05-19 LAB — HEPATIC FUNCTION PANEL
ALT: 14 IU/L (ref 0–44)
AST: 18 IU/L (ref 0–40)
Albumin: 4.4 g/dL (ref 3.8–4.8)
Alkaline Phosphatase: 91 IU/L (ref 44–121)
Bilirubin Total: 0.4 mg/dL (ref 0.0–1.2)
Bilirubin, Direct: 0.11 mg/dL (ref 0.00–0.40)
Total Protein: 6.5 g/dL (ref 6.0–8.5)

## 2020-05-22 ENCOUNTER — Other Ambulatory Visit: Payer: Self-pay

## 2020-05-22 ENCOUNTER — Ambulatory Visit: Payer: BC Managed Care – PPO | Attending: Neurology | Admitting: Physical Therapy

## 2020-05-22 ENCOUNTER — Encounter: Payer: Self-pay | Admitting: Physical Therapy

## 2020-05-22 DIAGNOSIS — M6281 Muscle weakness (generalized): Secondary | ICD-10-CM | POA: Diagnosis present

## 2020-05-22 DIAGNOSIS — R2681 Unsteadiness on feet: Secondary | ICD-10-CM | POA: Diagnosis present

## 2020-05-22 DIAGNOSIS — M5442 Lumbago with sciatica, left side: Secondary | ICD-10-CM | POA: Diagnosis present

## 2020-05-22 DIAGNOSIS — R293 Abnormal posture: Secondary | ICD-10-CM | POA: Diagnosis present

## 2020-06-02 ENCOUNTER — Other Ambulatory Visit: Payer: Self-pay | Admitting: *Deleted

## 2020-06-02 ENCOUNTER — Telehealth: Payer: Self-pay | Admitting: Urology

## 2020-06-02 DIAGNOSIS — N201 Calculus of ureter: Secondary | ICD-10-CM

## 2020-06-02 NOTE — Therapy (Signed)
[facility] [address] [place], [place], [address] Phone: [phone]   Fax:  [phone]  Physical Therapy Treatment  Patient Details  Name: Cameron [name] MRN: [id number] Date of Birth: [birth year] Referring Provider (PT): Arlice Colt   Encounter Date: 05/22/2020     05/22/20 0809  PT Visits / Re-Eval  Visit Number 8  Number of Visits 10  Date for PT Re-Evaluation 05/22/20  Authorization  Authorization Type BCBS  PT Time Calculation  PT Start Time 0805  PT Stop Time 0845  PT Time Calculation (min) 40 min  PT - End of Session  Equipment Utilized During Treatment Other (comment) (physioball)  Activity Tolerance Patient tolerated treatment well;No increased pain (Pt reports less numbness in L thigh and no groin pain at end of session)  Behavior During Therapy [facility] for tasks assessed/performed    Past Medical History:  Diagnosis Date  . Abnormal MRI 2007   nonspecific white matter abnormalities  . Allergy   . Anxiety   . GERD (gastroesophageal reflux disease)   . History of kidney stones   . Hyperlipidemia   . Migraines    OCULAR    Past Surgical History:  Procedure Laterality Date  . CYSTOSCOPY/URETEROSCOPY/HOLMIUM LASER/STENT PLACEMENT Right 07/27/2018   Procedure: CYSTOSCOPY/URETEROSCOPY/HOLMIUM LASER/STENT PLACEMENT;  Surgeon: Abbie Sons, MD;  Location: [facility] ORS;  Service: Urology;  Laterality: Right;  . URETEROLITHOTOMY  11/2001  . US ECHOCARDIOGRAPHY  11/2004   normal, Carotids normal    There were no vitals filed for this visit.     05/22/20 0806  Symptoms/Limitations  Subjective No new complaints. No falls. Gets the first steriod injection on 05/28/20. Has noticed more spascity in the left LE, better today.  Pertinent History MS dx 2021  Patient Stated Goals Improved flexibility, improved muscle mass and stamina; core strength; balance  Pain Assessment  Currently in Pain? Yes   Pain Score 2 (1-2/10)  Pain Location Back  Pain Orientation Lower  Pain Descriptors / Indicators Numbness  Pain Type Neuropathic pain  Pain Onset More than a month ago  Pain Frequency Constant  Aggravating Factors  prolonged standing  Pain Relieving Factors lidocaine patch, Ibuprofen, massage gun, and has started low dose Gabapentin   Treatment: Access Code: [email or web address] URL: [email or web address] Date: 06/02/2020 Prepared by: Willow Ora  Verbally reviewed the following: Exercises Supine Bridge - 1 x daily - 7 x weekly - 1-2 sets - 10 reps - 3 sec hold Small Range Straight Leg Raise - 1 x daily - 7 x weekly - 1-2 sets - 10 reps Seated Hamstring Stretch - 1 x daily - 7 x weekly - 1 sets - 3 reps - 30 sec hold Slow Controlled Bounce on Swiss Ball - 1 x daily - 5 x weekly - 2 sets - 1 minute hold Pelvic Tilt on Swiss Ball - 1 x daily - 7 x weekly - 2 sets - 10 reps Seated Lateral Pelvic Tilt on Swiss Ball - 1 x daily - 7 x weekly - 2 sets - 10 reps Pelvic Circles on [employer] - 1 x daily - 7 x weekly - 2 sets - 10 reps  Pt performed the following ex's in session with min guard assist, cues on form/technique. Added to HEP this session. Shoulder Horizontal Abduction with Resistance on Swiss Ball - 1 x daily - 5 x weekly - 1 sets - 10 reps Chest Press with Anchored Resistance on Swiss Ball - 1 x daily -  5 x weekly - 1 sets - 10 reps Row with Anchored Resistance on Swiss Ball - 1 x daily - 5 x weekly - 1 sets - 10 reps Seated Shoulder Extension and Scapular Retraction with Resistance on Swiss Ball - 1 x daily - 5 x weekly - 1 sets - 10 reps Romberg Stance Eyes Closed on Foam Pad - 1 x daily - 5 x weekly - 1 sets - 3 reps - 30 hold Wide Stance with Eyes Closed and Head Rotation on Foam Pad - 1 x daily - 5 x weekly - 1 sets - 10 reps         05/22/20 1730  PT Education  Education Details updated to [employer]) Educated Patient  Methods  Explanation;Demonstration;Verbal cues  Comprehension Verbalized understanding;Returned demonstration          PT Long Term Goals - 05/18/20 0933      PT LONG TERM GOAL #1   Title Pt will be independent with HEP for improved functional mobility, strength, balance, and decreased pain.  TARGET 05/22/2020    Time 5    Period Weeks    Status On-going      PT LONG TERM GOAL #2   Title Pt will verbalize at least 3 means to decrease low back pain for improved functional mobility.    Time 5    Period Weeks    Status Achieved      PT LONG TERM GOAL #3   Title Pt will rate improvement in Oswestry Disability Index to less than or equal to 20% disability, to demonstrate less pain with functional activities.    Baseline decreased to 22% improved but not to goal    Time 5    Period Weeks    Status Partially Met      PT LONG TERM GOAL #4   Title FGA score to improve to 29/30, to indicate improved balance in setting with eyes closed or decreased visual input.    Baseline 28/30    Time 5    Period Weeks    Status Partially Met      PT LONG TERM GOAL #5   Title Pt will verbalize understanding of energy conservation and fall prevention education.    Time 5    Period Weeks    Status Achieved             05/22/20 0809  Plan  Clinical Impression Statement Today's skilled session focused on review of band ex's on pball and addion of corner balance ex's to HEP. Unable to get ex's added/printed this session due to error with Medbridge. Will email them at a later date. Pt in agreement with hold on PT until after injection and then will let us know if PT is still needed.  Personal Factors and Comorbidities Comorbidity 3+  Comorbidities Anxiety, GERD, hx of ocular migraines, hx of kidney stones  Examination-Activity Limitations Locomotion Level;Transfers;Sit;Stand;Stairs  Examination-Participation Restrictions Occupation;Community Activity;Other (Community fitness at gym)  Pt will benefit  from skilled therapeutic intervention in order to improve on the following deficits Abnormal gait;Decreased range of motion;Difficulty walking;Pain;Decreased balance;Impaired flexibility;Decreased mobility;Decreased strength;Postural dysfunction  Stability/Clinical Decision Making Evolving/Moderate complexity  Rehab Potential Good  PT Frequency 2x / week (1x/wk after eval, then)  PT Duration Other (comment) (for 4 weeks; total POC = 5 weeks)  PT Treatment/Interventions ADLs/Self Care Home Management;Electrical Stimulation;Neuromuscular re-education;Balance training;Therapeutic exercise;Therapeutic activities;Functional mobility training;Ultrasound;Moist Heat;Traction;Gait training;Stair training;Patient/family education;Manual techniques;Energy conservation;Passive range of motion  PT Next Visit Plan  Pt to go on hold until after injection and then will let us know if he needs more visits or is ready to D/C with exercises.  Consulted and Agree with Plan of Care Patient          Patient will benefit from skilled therapeutic intervention in order to improve the following deficits and impairments:  Abnormal gait,Decreased range of motion,Difficulty walking,Pain,Decreased balance,Impaired flexibility,Decreased mobility,Decreased strength,Postural dysfunction  Visit Diagnosis: Muscle weakness (generalized)  Abnormal posture  Acute bilateral low back pain with left-sided sciatica  Unsteadiness on feet     Problem List Patient Active Problem List   Diagnosis Date Noted  . 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 Lower urinary tract symptoms (LUTS) 04/01/2020  . Low vitamin D level 02/20/2020  . High risk medication use 11/04/2019  . Vitamin D deficiency 11/04/2019  . Left foot drop 11/04/2019  . Vision disturbance 11/04/2019  . Balance disorder 09/24/2019  . Abnormal MRI of head 09/24/2019  . History of nephrolithiasis 03/07/2019  . Left inguinal hernia 03/07/2019  . Ureteral  calculus 07/25/2018  . Hydronephrosis with urinary obstruction due to ureteral calculus 07/25/2018  . Anxiety 12/22/2017  . Low back pain 05/29/2014  . Routine general medical examination at a health care facility 02/23/2012  . GERD (gastroesophageal reflux disease)   . Hyperlipemia 10/03/2007  . Seasonal allergic rhinitis due to pollen 07/19/2006  . RENAL CALCULUS 07/19/2006  . Multiple sclerosis ([place]) 10/16/2005    Willow Ora, PTA, [place] [address], [place], [place] [address] [phone] 06/02/20, 5:29 PM   Name: Cameron [name] MRN: [id number] Date of Birth: 11/25/1955

## 2020-06-02 NOTE — Telephone Encounter (Signed)
-----   Message from Abbie Sons, MD sent at 05/31/2020  8:02 PM EST ----- Regarding: Follow-up Please schedule follow-up office visit with KUB December 2023

## 2020-06-02 NOTE — Telephone Encounter (Signed)
App made and mailed to patient 

## 2020-06-02 NOTE — Patient Instructions (Signed)
Access Code: [email or web address] URL: [email or web address] Date: 06/02/2020 Prepared by: Willow Ora  Exercises Supine Bridge - 1 x daily - 7 x weekly - 1-2 sets - 10 reps - 3 sec hold Small Range Straight Leg Raise - 1 x daily - 7 x weekly - 1-2 sets - 10 reps Seated Hamstring Stretch - 1 x daily - 7 x weekly - 1 sets - 3 reps - 30 sec hold Slow Controlled Bounce on Swiss Ball - 1 x daily - 5 x weekly - 2 sets - 1 minute hold Pelvic Tilt on Swiss Ball - 1 x daily - 7 x weekly - 2 sets - 10 reps Seated Lateral Pelvic Tilt on Swiss Ball - 1 x daily - 7 x weekly - 2 sets - 10 reps Pelvic Circles on [employer] - 1 x daily - 7 x weekly - 2 sets - 10 reps  Added the following today to HEP with review in session: Shoulder Horizontal Abduction with Resistance on Swiss Ball - 1 x daily - 5 x weekly - 1 sets - 10 reps Chest Press with Anchored Resistance on Swiss Ball - 1 x daily - 5 x weekly - 1 sets - 10 reps Row with Anchored Resistance on Swiss Ball - 1 x daily - 5 x weekly - 1 sets - 10 reps Seated Shoulder Extension and Scapular Retraction with Resistance on Swiss Ball - 1 x daily - 5 x weekly - 1 sets - 10 reps Romberg Stance Eyes Closed on Foam Pad - 1 x daily - 5 x weekly - 1 sets - 3 reps - 30 hold Wide Stance with Eyes Closed and Head Rotation on Foam Pad - 1 x daily - 5 x weekly - 1 sets - 10 reps

## 2020-06-11 ENCOUNTER — Other Ambulatory Visit: Payer: Self-pay | Admitting: Neurology

## 2020-06-15 ENCOUNTER — Other Ambulatory Visit: Payer: BC Managed Care – PPO

## 2020-06-25 ENCOUNTER — Encounter: Payer: Self-pay | Admitting: Neurology

## 2020-06-25 ENCOUNTER — Ambulatory Visit: Payer: BC Managed Care – PPO | Admitting: Neurology

## 2020-06-25 ENCOUNTER — Other Ambulatory Visit: Payer: Self-pay

## 2020-06-25 VITALS — BP 126/87 | HR 87 | Ht 71.0 in | Wt 166.0 lb

## 2020-06-25 DIAGNOSIS — Z79899 Other long term (current) drug therapy: Secondary | ICD-10-CM | POA: Diagnosis not present

## 2020-06-25 DIAGNOSIS — G35 Multiple sclerosis: Secondary | ICD-10-CM | POA: Diagnosis not present

## 2020-06-25 DIAGNOSIS — M5126 Other intervertebral disc displacement, lumbar region: Secondary | ICD-10-CM

## 2020-06-25 DIAGNOSIS — M5416 Radiculopathy, lumbar region: Secondary | ICD-10-CM | POA: Insufficient documentation

## 2020-06-25 DIAGNOSIS — R5383 Other fatigue: Secondary | ICD-10-CM

## 2020-06-25 MED ORDER — MODAFINIL 200 MG PO TABS: 200.0000 mg | ORAL_TABLET | Freq: Every day | ORAL | 5 refills | Status: DC

## 2020-06-25 NOTE — Progress Notes (Signed)
[facility]  PATIENT: Cameron [name] DOB: July 29, 1955  REFERRING DOCTOR OR PCP: Viviana Simpler MD SOURCE: Patient, notes from primary care, imaging and laboratory reports, MRI images personally reviewed.  _________________________________   HISTORICAL  CHIEF COMPLAINT:  Chief Complaint  Patient presents with  . Multiple Sclerosis    Rm 12, 4 month FU, Aubagio, "05/28/20 epidural for herniated disk, doing well, completed PT-very helpful, still practice it"     HISTORY OF PRESENT ILLNESS:  Cameron [name] is a 65 y.o. man with relapsing remitting MS diagnosed 09/2019 but likely present since 2007 when he had visual disturbances. Marland Kitchen    Update 02/20/2020 He has been on Aubagio for 6 months and he tolerates it fairly well.   LFTs were fine.  The fatigue he had initially responded to modafinil 200 mg po around 10 am  Currently, he feels his gait is doing well.   He goes up and down stars easily .  He notes a mild left leg weaknes nad has some dysaestesias in the left leg.   he has noted more muscle spasticity on the left which affects gait.    No current visual issues.   He notes some urinary urgency but no incontinence.      He also has had int. complete emptying and reduced stream.   Constipation is better.   .  Fatigue improved on modafinil He sleeps well.   Depression is better but he has some anxiety.    The word finding issues are better.  He notes headaches are better.   Due to the back and leg issues, lumbar MRI was perfomred   The MRI of the lumbar spine shows a large herniated disc at L2-L3 towards the left compressing the left L3 nerve root.  Additionally there is multilevel degenerative change elsewhere but with less potential for nerve root compression.   He had the [facility]/28/20 for the L2L3 HNP and had great benefit a couple days and continues to have some benefit.   He has pain and numbness in the left leg that is now intermittent.   He saw Dr. Zada Finders and  surgery will be considered if ESI does not help long enough.   He continues doing PT.   He is also doing yoga exercises   MS History:      He began to experience some neurologic symptoms in 2007.  That year, he had the onset of difficulties with eye movements.  Specifically, he was having difficulty tracking smoothly.   He also had some stumbling.  Over 2 monhs, his symptoms improved.   In 2013, he fell due to a left foot drop and continues to have mild left leg weakness. Since then he also has noted that he feels disoriented, has trouble coming up with the right words and veers to the left intermittently, especially in bright fluorescent lights as in some stores.  He also has had left hand numbness and reduced coordination for the last year.   He was diagnosed 11/05/2019 based on his history of optic neuritis, relapses with left-sided symptoms and additional changes in brain MRI and the presence of 2 foci in the cervical spine  Imaging studies: MRIs of the brain from 10/11/2005 and 10/29/2012:  The MRI show multiple T2/FLAIR hyperintense foci, at least 20, with some in the periventricular white matter and some in the juxtacortical white matter.  There was U-fiber involvement of a couple foci in the posterior left frontal lobe.  Between 2007  and 2014, 2-3 more lesions developed (the new lesions were mostly deep white matter).  We do not have any images of the cervical or thoracic spine spine so the spinal cord could not be reviewed.  MRI of the brain 10/15/2019 showed scattered T2/FLAIR hyperintense foci, some in the periventricular white matter radially oriented to the ventricles and a couple in the juxtacortical white matter..  Compared to the MRI from 2014, there were 3 or 4 new lesions.  None of the foci enhance.  MRI of the cervical and thoracic spine 10/15/2019 showed 2 T2 hyperintense foci within the spinal cord, 1 laterally to the left at C4 and the other posteriorly to the left adjacent to C6-C7.   There are degenerative changes at C4-C5 and C5-C6 with potential for left C5 nerve root compression.  There is mild spinal stenosis.  The thoracic spine showed a normal spinal cord and minimal degenerative changes at a couple levels that did not lead to nerve root compression or spinal stenosis.  MRI of the lumbar spine shows a large herniated disc at L2-L3 towards the left compressing the left L3 nerve root.  Additionally there is multilevel degenerative change elsewhere but with less potential for nerve root compression.   REVIEW OF SYSTEMS: Constitutional: No fevers, chills, sweats, or change in appetite Eyes: As above Ear, nose and throat: No hearing loss, ear pain, nasal congestion, sore throat Cardiovascular: No chest pain, palpitations Respiratory: No shortness of breath at rest or with exertion.   No wheezes GastrointestinaI: No nausea, vomiting, diarrhea, abdominal pain, fecal incontinence Genitourinary: No dysuria, urinary retention.  He has mild frequency and nocturia.  He has a history of kidney stones. Musculoskeletal: No neck pain, back pain Integumentary: No rash, pruritus, skin lesions Neurological: as above Psychiatric: No depression at this time.  No anxiety Endocrine: No palpitations, diaphoresis, change in appetite, change in weigh or increased thirst Hematologic/Lymphatic: No anemia, purpura, petechiae. Allergic/Immunologic: No itchy/runny eyes, nasal congestion, recent allergic reactions, rashes  ALLERGIES: Allergies  Allergen Reactions  . Citalopram Hydrobromide     REACTION: unspecified  . Penicillins Other (See Comments)    REACTION: questionable REACTION: questionable    HOME MEDICATIONS:  Current Outpatient Medications:  .  cyclobenzaprine (FLEXERIL) 10 MG tablet, Take 0.5-1 tablets (5-10 mg total) by mouth at bedtime., Disp: 30 tablet, Rfl: 2 .  esomeprazole (NEXIUM) 20 MG capsule, Take 20 mg by mouth as needed. , Disp: , Rfl:  .  fluticasone  (FLONASE) 50 MCG/ACT nasal spray, Place 2 sprays into both nostrils daily. In each nostril, Disp: 16 g, Rfl: 12 .  gabapentin (NEURONTIN) 100 MG capsule, Take 1 or 2 pills up to 3 times a day, Disp: 180 capsule, Rfl: 3 .  hydrocortisone (ANUSOL-HC) 25 MG suppository, UNWRAP AND INSERT 1 SUPPOSITORY INTO THE RECTUM 3 TIMES DAILY AS NEEDED, Disp: 30 suppository, Rfl: 1 .  ibuprofen (ADVIL,MOTRIN) 200 MG tablet, Take 200-400 mg by mouth every 6 (six) hours as needed., Disp: , Rfl:  .  Teriflunomide (AUBAGIO) 14 MG TABS, Take 1 tablet by mouth daily., Disp: , Rfl:  .  modafinil (PROVIGIL) 200 MG tablet, Take 1 tablet (200 mg total) by mouth daily. Take one po qAM and 1/2 po qPM, Disp: 45 tablet, Rfl: 5  PAST MEDICAL HISTORY: Past Medical History:  Diagnosis Date  . Abnormal MRI 2007   nonspecific white matter abnormalities  . Allergy   . Anxiety   . GERD (gastroesophageal reflux disease)   . History  of kidney stones   . Hyperlipidemia   . Migraines    OCULAR    PAST SURGICAL HISTORY: Past Surgical History:  Procedure Laterality Date  . CYSTOSCOPY/URETEROSCOPY/HOLMIUM LASER/STENT PLACEMENT Right 07/27/2018   Procedure: CYSTOSCOPY/URETEROSCOPY/HOLMIUM LASER/STENT PLACEMENT;  Surgeon: Abbie Sons, MD;  Location: [facility] ORS;  Service: Urology;  Laterality: Right;  . URETEROLITHOTOMY  11/2001  . US ECHOCARDIOGRAPHY  11/2004   normal, Carotids normal    FAMILY HISTORY: Family History  Problem Relation Age of Onset  . Diabetes Father   . Heart disease Father   . Dementia Father   . Dementia Paternal Grandmother   . CAD Neg Hx   . Hypertension Neg Hx   . Cancer Neg Hx     SOCIAL HISTORY:  Social History   Socioeconomic History  . Marital status: Married    Spouse name: Not on file  . Number of children: Not on file  . Years of education: Not on file  . Highest education level: Not on file  Occupational History  . Occupation: Designer, jewellery in Archivist: [employer]  Tobacco Use  . Smoking status: Never Smoker  . Smokeless tobacco: Never Used  Vaping Use  . Vaping Use: Never used  Substance and Sexual Activity  . Alcohol use: Yes    Alcohol/week: 0.0 standard drinks    Comment: rare- once a month  . Drug use: No  . Sexual activity: Not on file  Other Topics Concern  . Not on file  Social History Narrative   Lives   Caffeine use:    Long standing relationship with partner--finally able to marry   Social Determinants of Health   Financial Resource Strain: Not on file  Food Insecurity: Not on file  Transportation Needs: Not on file  Physical Activity: Not on file  Stress: Not on file  Social Connections: Not on file  Intimate Partner Violence: Not on file     PHYSICAL EXAM  Vitals:   06/25/20 0825  BP: 126/87  Pulse: 87  Weight: 166 lb (75.3 kg)  Height: 5\' 11"  (1.803 m)    Body mass index is 23.15 kg/m.   General: The patient is well-developed and well-nourished and in no acute distress  Neck:  The neck is nontender.  Skin: Extremities are without rash or  edema.  Neurologic Exam  Mental status: The patient is alert and oriented x 3 at the time of the examination. The patient has apparent normal recent and remote memory, with an apparently normal attention span and concentration ability.   Speech is normal.  Cranial nerves: Extraocular movements are full.Facial strength is fine. No dysarthria is noted.No obvious hearing deficits are noted.  Motor:  Muscle bulk is normal.   Tone is normal. Strength is  5 / 5 in all 4 extremities except 4+/5 proximal left leg..   Sensory: Sensory testing is intact to soft touch and vibration sensation in all 4 extremities.  Coordination: Cerebellar testing reveals good finger-nose-finger and heel-to-shin bilaterally.  Gait and station: Station is normal.    Gait had a normal stride.  Slight left foot drop.  Tandem gait is slightly wide. Romberg is negative.    Reflexes: Deep tendon reflexes are symmetric and normal in arms but increased at knees, left > right (with spread) and ankles (no clonus).   Plantar responses are flexor.    DIAGNOSTIC DATA (LABS, IMAGING, TESTING) - I reviewed patient records, labs, notes, testing and imaging myself  where available.  Lab Results  Component Value Date   WBC 6.0 11/04/2019   HGB 15.2 11/04/2019   HCT 42.9 11/04/2019   MCV 88 11/04/2019   PLT 253 11/04/2019      Component Value Date/Time   NA 136 11/04/2019 1433   K 4.7 11/04/2019 1433   CL 100 11/04/2019 1433   CO2 23 11/04/2019 1433   GLUCOSE 84 11/04/2019 1433   GLUCOSE 72 04/29/2019 1224   BUN 10 11/04/2019 1433   CREATININE 0.97 11/04/2019 1433   CREATININE 1.13 11/21/2016 1224   CALCIUM 9.4 11/04/2019 1433   PROT 6.5 05/18/2020 0823   ALBUMIN 4.4 05/18/2020 0823   AST 18 05/18/2020 0823   ALT 14 05/18/2020 0823   ALKPHOS 91 05/18/2020 0823   BILITOT 0.4 05/18/2020 0823   GFRNONAA 83 11/04/2019 1433   GFRAA 96 11/04/2019 1433   Lab Results  Component Value Date   CHOL 248 (H) 11/21/2016   HDL 43 11/21/2016   LDLCALC 155 (H) 11/21/2016   LDLDIRECT 136.0 10/21/2014   TRIG 249 (H) 11/21/2016   CHOLHDL 5.8 (H) 11/21/2016   No results found for: HGBA1C Lab Results  Component Value Date   VITAMINB12 430 09/19/2012   Lab Results  Component Value Date   TSH 5.45 (H) 10/16/2013       ASSESSMENT AND PLAN  Multiple sclerosis ([place]) - Plan: CBC with Differential/Platelet, Hepatic function panel  High risk medication use - Plan: CBC with Differential/Platelet, Hepatic function panel  Lumbar disc herniation  Lumbar radiculopathy  Other fatigue  1.  Continue Aubagio.   Will check labs 2.  Aroubd time of next visit, we will check MRI brianto determine if any breakthrough while on Aubagio.  If occurring, we will switch to a stronger DMT     3.  F/u with Neurosurgery if radicular symptoms return.   4.   Stay active and  exercise as tolerated 5.   Return to see me in 4 months or sooner if there are new or worsening neurologic symptoms.  45-minute office visit with the majority of the time spent face-to-face for history and physical, discussion/counseling and decision-making.  Additional time with record review and documentation.    Sayid Moll A. Felecia Shelling, MD, [facility] 08/12/621, 7:62 AM Certified in Neurology, Clinical Neurophysiology, Sleep Medicine and Neuroimaging  [facility] [address] [place], [place] [address] [phone]

## 2020-06-26 LAB — CBC WITH DIFFERENTIAL/PLATELET
Basophils Absolute: 0 10*3/uL (ref 0.0–0.2)
Basos: 1 %
EOS (ABSOLUTE): 0 10*3/uL (ref 0.0–0.4)
Eos: 1 %
Hematocrit: 45.8 % (ref 37.5–51.0)
Hemoglobin: 15.8 g/dL (ref 13.0–17.7)
Immature Grans (Abs): 0 10*3/uL (ref 0.0–0.1)
Immature Granulocytes: 0 %
Lymphocytes Absolute: 1.9 10*3/uL (ref 0.7–3.1)
Lymphs: 41 %
MCH: 31.2 pg (ref 26.6–33.0)
MCHC: 34.5 g/dL (ref 31.5–35.7)
MCV: 90 fL (ref 79–97)
Monocytes Absolute: 0.5 10*3/uL (ref 0.1–0.9)
Monocytes: 9 %
Neutrophils Absolute: 2.3 10*3/uL (ref 1.4–7.0)
Neutrophils: 48 %
Platelets: 231 10*3/uL (ref 150–450)
RBC: 5.07 x10E6/uL (ref 4.14–5.80)
RDW: 12.1 % (ref 11.6–15.4)
WBC: 4.8 10*3/uL (ref 3.4–10.8)

## 2020-06-26 LAB — HEPATIC FUNCTION PANEL
ALT: 14 IU/L (ref 0–44)
AST: 15 IU/L (ref 0–40)
Albumin: 4.3 g/dL (ref 3.8–4.8)
Alkaline Phosphatase: 87 IU/L (ref 44–121)
Bilirubin Total: 0.3 mg/dL (ref 0.0–1.2)
Bilirubin, Direct: <0.1 mg/dL (ref 0.00–0.40)
Total Protein: 7 g/dL (ref 6.0–8.5)

## 2020-08-20 IMAGING — CT CT RENAL STONE PROTOCOL
2 of 4 series · 16 of 46 positions shown, 18 images · non-contrast
Comparison: None.

CLINICAL DATA: Acute right flank pain.

EXAM:
CT ABDOMEN AND PELVIS WITHOUT CONTRAST
TECHNIQUE: Multidetector CT imaging of the abdomen and pelvis was performed
following the standard protocol without IV contrast.

[Series 2: stone full standard · axial · 0.72mm/px · z∈[-527,-107]mm · 13 of 92 slices shown, 15 images]
[im 4/92  soft-tissue]
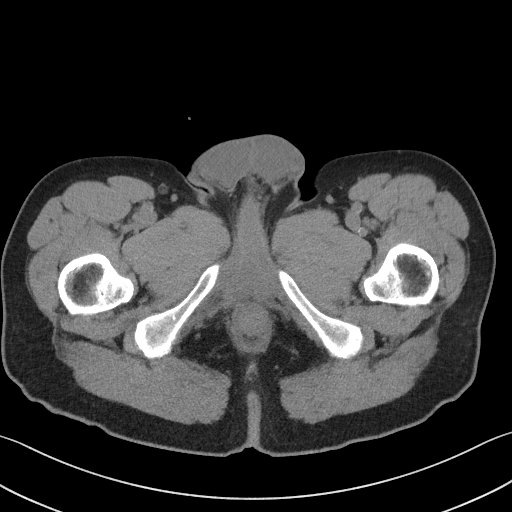
[im 4/92  bone]
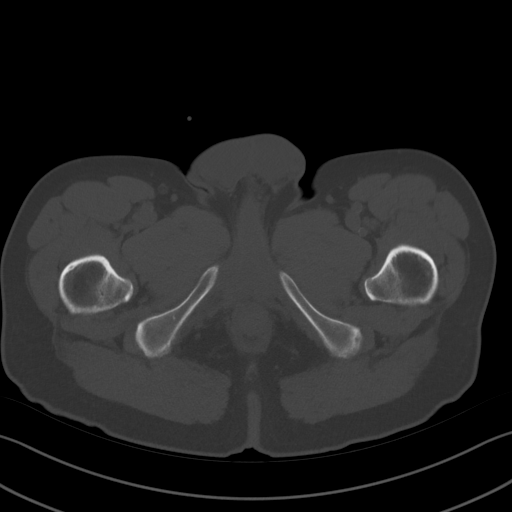
[im 12/92  soft-tissue]
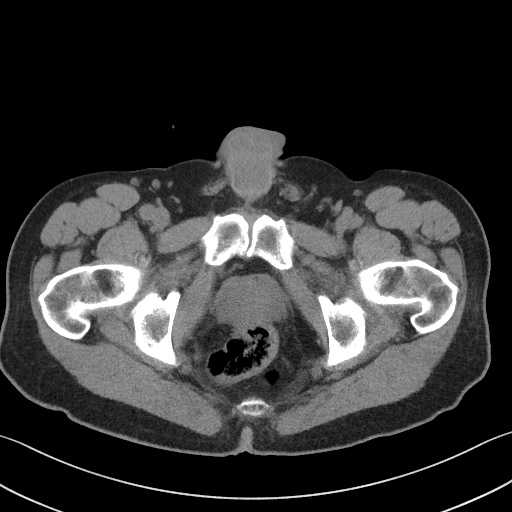
[im 19/92  soft-tissue]
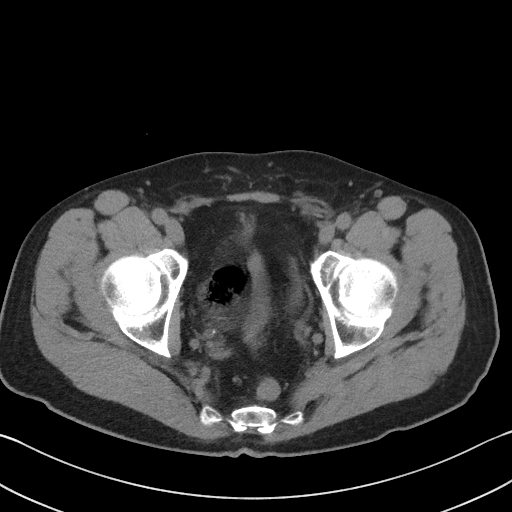
[im 27/92  soft-tissue]
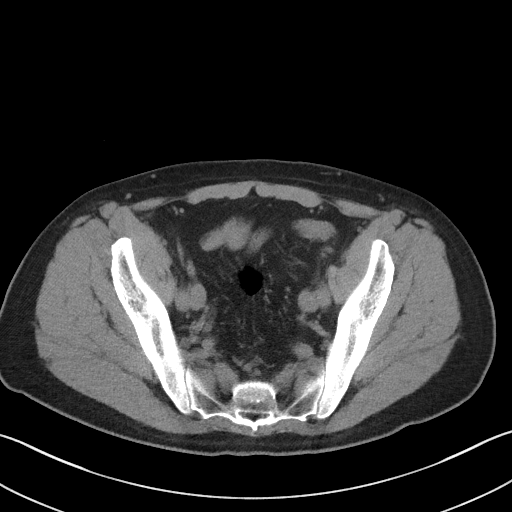
[im 31/92  soft-tissue]
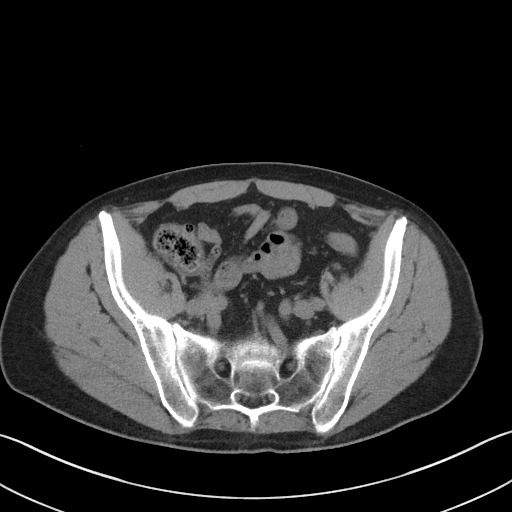
[im 38/92  soft-tissue]
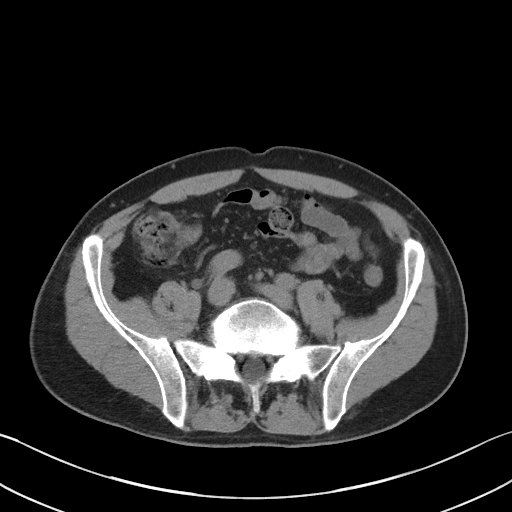
[im 46/92  soft-tissue]
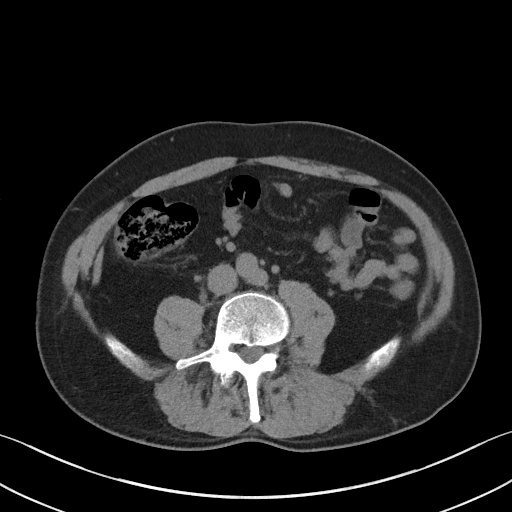
[im 54/92  soft-tissue]
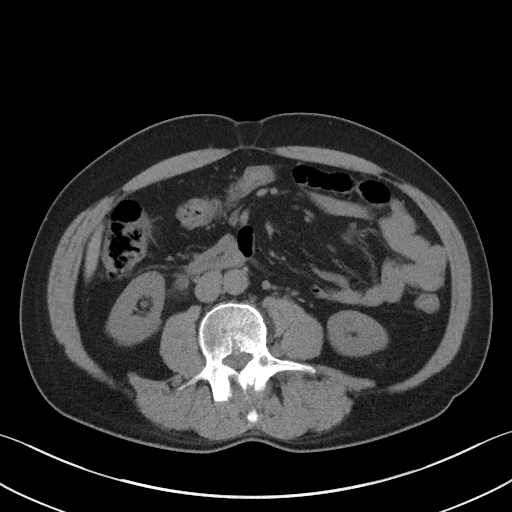
[im 61/92  soft-tissue]
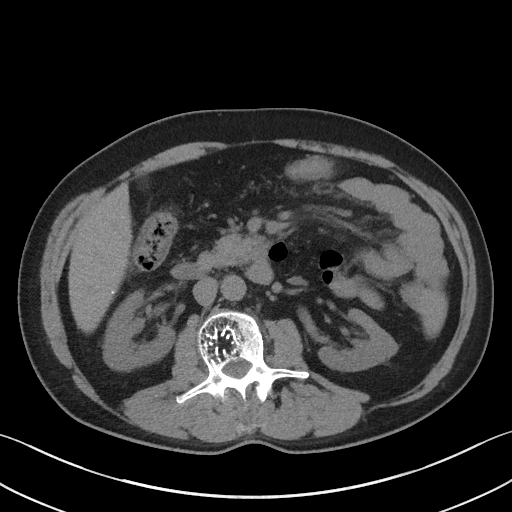
[im 61/92  bone]
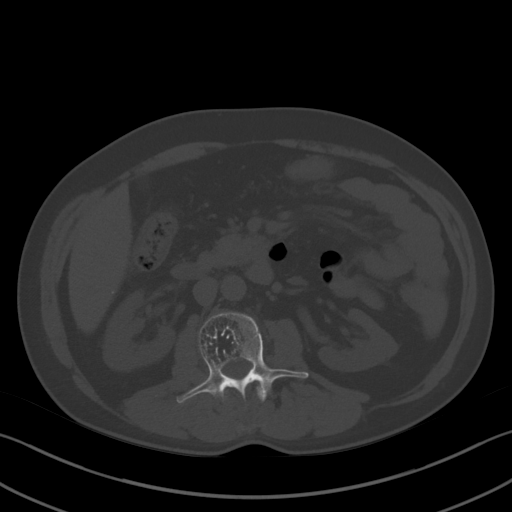
[im 65/92  soft-tissue]
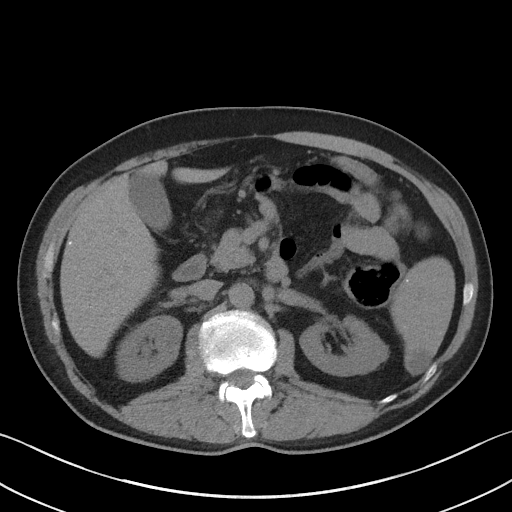
[im 73/92  soft-tissue]
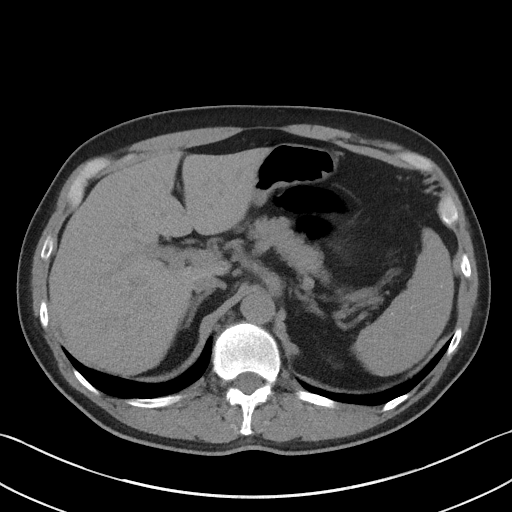
[im 80/92  soft-tissue]
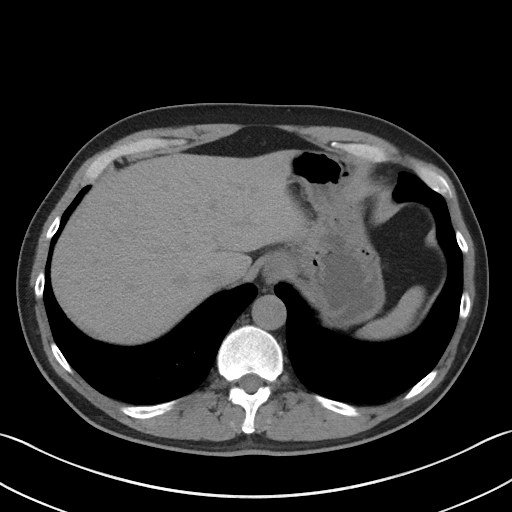
[im 88/92  soft-tissue]
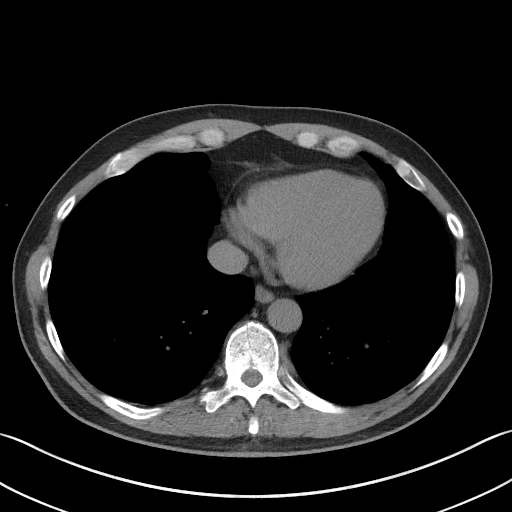

[Series 5: coronal · coronal · 0.66mm/px · 3 of 131 slices shown]
[im 44/131  soft-tissue]
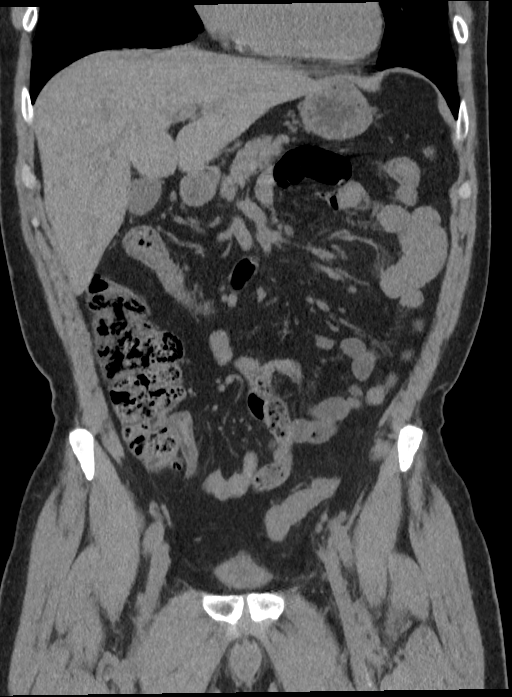
[im 58/131  soft-tissue]
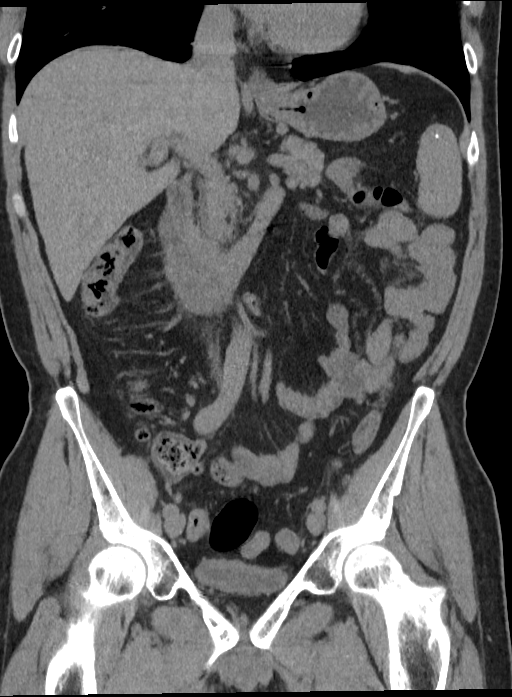
[im 73/131  soft-tissue]
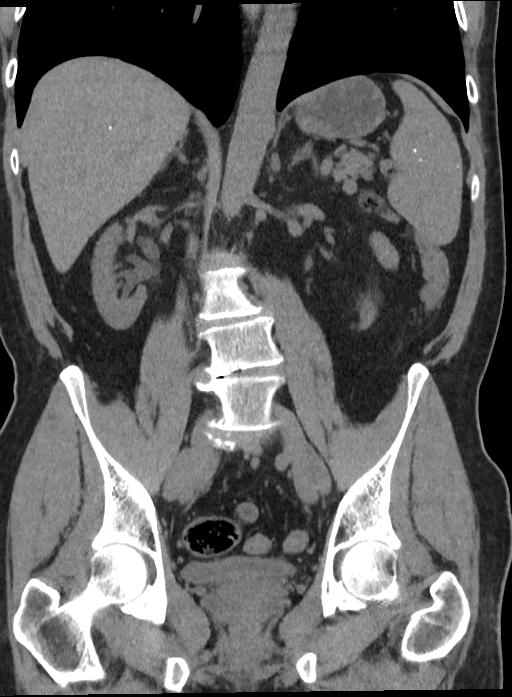

[16 of 46 positions shown; findings below may reference images not displayed]

FINDINGS: Lower chest: Normal.

Hepatobiliary: There are a few calcified granulomas in the otherwise
normal appearing liver. Biliary tree is normal.

Pancreas: Unremarkable. No pancreatic ductal dilatation or
surrounding inflammatory changes.

Spleen: Multiple calcified granulomas in the spleen. 2.6 cm
low-density lesion in the posterior aspect of the spleen,
indeterminate.

Adrenals/Urinary Tract: There is mild right hydronephrosis. The
right ureter is dilated to the level of the ureterovesical junction
where there is a 5 x 7 mm stone obstructing the ureter. There are no
renal calculi. Left kidney is normal. Adrenal glands are normal.
Bladder is normal.

Stomach/Bowel: Stomach is within normal limits. Appendix appears
normal. No evidence of bowel wall thickening, distention, or
inflammatory changes.

Vascular/Lymphatic: No significant vascular findings are present. No
enlarged abdominal or pelvic lymph nodes.

Reproductive: Prostate is unremarkable.

Other: No abdominal wall hernia or abnormality. No abdominopelvic
ascites.

Musculoskeletal: No acute abnormality. Multilevel degenerative disc
disease. Benign hemangioma in 1 of the upper lumbar vertebra.
IMPRESSION: 5 x 7 mm stone obstructing the distal right ureter. Mild right
hydronephrosis. No renal calculi.

2.6 cm low-density lesion in the posterior aspect of the spleen is
indeterminate but statistically most likely a benign lesion.

## 2020-08-26 ENCOUNTER — Ambulatory Visit: Payer: BC Managed Care – PPO | Admitting: Internal Medicine

## 2020-08-26 ENCOUNTER — Other Ambulatory Visit: Payer: Self-pay

## 2020-08-26 ENCOUNTER — Encounter: Payer: Self-pay | Admitting: Internal Medicine

## 2020-08-26 DIAGNOSIS — B370 Candidal stomatitis: Secondary | ICD-10-CM | POA: Diagnosis not present

## 2020-08-26 MED ORDER — FLUCONAZOLE 150 MG PO TABS: 150.0000 mg | ORAL_TABLET | Freq: Every day | ORAL | 1 refills | Status: DC

## 2020-08-26 NOTE — Progress Notes (Signed)
Subjective:    Patient ID: Cameron [name], male    DOB: 10-Oct-1955, 65 y.o.   MRN: [id number]  HPI Here due to a rash on his tongue This visit occurred during the SARS-CoV-2 public health emergency.  Safety protocols were in place, including screening questions prior to the visit, additional usage of staff PPE, and extensive cleaning of exam room while observing appropriate contact time as indicated for disinfecting solutions.   No blisters but "it feels like I burned my tongue"--but no trauma Started around a week ago Some whiteness and accentuation of midline crevice No change in taste Hard to keep his mouth moist No other mouth involvement  Still having fatigue Not sure if it is related to MS/meds/semester let down  Now on 1 modafinil in AM, and 1/2 about 2PM Still has lag period Feels some sleep pressure but hasn't napped in the past  Current Outpatient Medications on File Prior to Visit  Medication Sig Dispense Refill  . cyclobenzaprine (FLEXERIL) 10 MG tablet Take 0.5-1 tablets (5-10 mg total) by mouth at bedtime. 30 tablet 2  . esomeprazole (NEXIUM) 20 MG capsule Take 20 mg by mouth as needed.     . fluticasone (FLONASE) 50 MCG/ACT nasal spray Place 2 sprays into both nostrils daily. In each nostril 16 g 12  . gabapentin (NEURONTIN) 100 MG capsule Take 1 or 2 pills up to 3 times a day 180 capsule 3  . hydrocortisone (ANUSOL-HC) 25 MG suppository UNWRAP AND INSERT 1 SUPPOSITORY INTO THE RECTUM 3 TIMES DAILY AS NEEDED 30 suppository 1  . ibuprofen (ADVIL,MOTRIN) 200 MG tablet Take 200-400 mg by mouth every 6 (six) hours as needed.    . modafinil (PROVIGIL) 200 MG tablet Take 1 tablet (200 mg total) by mouth daily. Take one po qAM and 1/2 po qPM 45 tablet 5  . Teriflunomide (AUBAGIO) 14 MG TABS Take 1 tablet by mouth daily.     No current facility-administered medications on file prior to visit.    Allergies  Allergen Reactions  . Citalopram Hydrobromide     REACTION:  unspecified  . Penicillins Other (See Comments)    REACTION: questionable REACTION: questionable    Past Medical History:  Diagnosis Date  . Abnormal MRI 2007   nonspecific white matter abnormalities  . Allergy   . Anxiety   . GERD (gastroesophageal reflux disease)   . History of kidney stones   . Hyperlipidemia   . Migraines    OCULAR    Past Surgical History:  Procedure Laterality Date  . CYSTOSCOPY/URETEROSCOPY/HOLMIUM LASER/STENT PLACEMENT Right 07/27/2018   Procedure: CYSTOSCOPY/URETEROSCOPY/HOLMIUM LASER/STENT PLACEMENT;  Surgeon: Abbie Sons, MD;  Location: [facility] ORS;  Service: Urology;  Laterality: Right;  . URETEROLITHOTOMY  11/2001  . US ECHOCARDIOGRAPHY  11/2004   normal, Carotids normal    Family History  Problem Relation Age of Onset  . Diabetes Father   . Heart disease Father   . Dementia Father   . Dementia Paternal Grandmother   . CAD Neg Hx   . Hypertension Neg Hx   . Cancer Neg Hx     Social History   Socioeconomic History  . Marital status: Married    Spouse name: Not on file  . Number of children: Not on file  . Years of education: Not on file  . Highest education level: Not on file  Occupational History  . 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  Smoking status: Never Smoker  . Smokeless tobacco: Never Used  Vaping Use  . Vaping Use: Never used  Substance and Sexual Activity  . Alcohol use: Yes    Alcohol/week: 0.0 standard drinks    Comment: rare- once a month  . Drug use: No  . Sexual activity: Not on file  Other Topics Concern  . Not on file  Social History Narrative   Lives   Caffeine use:    Long standing relationship with partner--finally able to marry   Social Determinants of Health   Financial Resource Strain: Not on file  Food Insecurity: Not on file  Transportation Needs: Not on file  Physical Activity: Not on file  Stress: Not on file  Social  Connections: Not on file  Intimate Partner Violence: Not on file   Review of Systems Has trip to beach planned next week Settling dad's estate---generally going well    Objective:   Physical Exam Constitutional:      Appearance: Normal appearance.  HENT:     Mouth/Throat:     Comments: Whitish plaque along middle of tongue Mild accentuation of fissure with slight redness Neurological:     Mental Status: He is alert.            Assessment & Plan:

## 2020-08-26 NOTE — Assessment & Plan Note (Addendum)
Looks like mild thrush Might be predisposed due to his MS treatment Will try fluconazole 150mg  for 3 days

## 2020-08-28 ENCOUNTER — Encounter: Payer: Self-pay | Admitting: *Deleted

## 2020-09-14 ENCOUNTER — Other Ambulatory Visit: Payer: Self-pay | Admitting: Family Medicine

## 2020-10-28 ENCOUNTER — Telehealth: Payer: Self-pay | Admitting: *Deleted

## 2020-10-28 NOTE — Telephone Encounter (Signed)
Submitted PA Aubagio via fax to [place] at [phone]. Received fax confirmation. Waiting on determination

## 2020-11-04 ENCOUNTER — Other Ambulatory Visit: Payer: Self-pay | Admitting: Neurology

## 2020-11-04 NOTE — Telephone Encounter (Signed)
Called [employer] at [phone]. Spoke w/ Beverlee Nims. PA approved 10/28/20-10/28/21. PA# [id number]. Faxed notice of approval to [facility] at [phone]. Received fax confirmation.

## 2020-11-16 ENCOUNTER — Other Ambulatory Visit: Payer: Self-pay

## 2020-11-16 ENCOUNTER — Other Ambulatory Visit (INDEPENDENT_AMBULATORY_CARE_PROVIDER_SITE_OTHER): Payer: Self-pay | Admitting: Neurological Surgery

## 2020-11-16 ENCOUNTER — Ambulatory Visit (INDEPENDENT_AMBULATORY_CARE_PROVIDER_SITE_OTHER): Payer: BC Managed Care – PPO

## 2020-11-16 DIAGNOSIS — R600 Localized edema: Secondary | ICD-10-CM

## 2020-11-16 DIAGNOSIS — M7989 Other specified soft tissue disorders: Secondary | ICD-10-CM

## 2020-12-18 ENCOUNTER — Encounter: Payer: Self-pay | Admitting: Internal Medicine

## 2020-12-18 ENCOUNTER — Other Ambulatory Visit: Payer: Self-pay

## 2020-12-18 ENCOUNTER — Ambulatory Visit: Payer: BC Managed Care – PPO | Admitting: Internal Medicine

## 2020-12-18 DIAGNOSIS — S46812A Strain of other muscles, fascia and tendons at shoulder and upper arm level, left arm, initial encounter: Secondary | ICD-10-CM | POA: Insufficient documentation

## 2020-12-18 NOTE — Assessment & Plan Note (Signed)
Just seems to be muscular Reassured--it should just be time for recovery Okay ibuprofen, heat. Has norco for night

## 2020-12-18 NOTE — Progress Notes (Signed)
Subjective:    Patient ID: Cameron [name], male    DOB: 05-Jan-1956, 65 y.o.   MRN: [id number]  HPI Here due to neck pain This visit occurred during the SARS-CoV-2 public health emergency.  Safety protocols were in place, including screening questions prior to the visit, additional usage of staff PPE, and extensive cleaning of exam room while observing appropriate contact time as indicated for disinfecting solutions.   About 10 days ago---was getting off bed, and hand supporting him slipped Felt something pop in posterior left neck and towards shoulder and down the arm Hoped it wasn't something--but it is ongoing  Pain affecting him sleep Taking meds ---ibuprofen '600mg'$ , lidocaine patch, left over hydrocodone (night)  Some arm weakness---avoiding using it  Current Outpatient Medications on File Prior to Visit  Medication Sig Dispense Refill   cyclobenzaprine (FLEXERIL) 10 MG tablet TAKE 1/2 -1 TABLETS (5-10 MG TOTAL) BY MOUTH AT BEDTIME. 30 tablet 2   esomeprazole (NEXIUM) 20 MG capsule Take 20 mg by mouth as needed.      fluticasone (FLONASE) 50 MCG/ACT nasal spray Place 2 sprays into both nostrils daily. In each nostril 16 g 12   ibuprofen (ADVIL,MOTRIN) 200 MG tablet Take 200-400 mg by mouth every 6 (six) hours as needed.     modafinil (PROVIGIL) 200 MG tablet Take 1 tablet (200 mg total) by mouth daily. Take one po qAM and 1/2 po qPM 45 tablet 5   Teriflunomide (AUBAGIO) 14 MG TABS Take 1 tablet by mouth daily. 30 tablet 2   hydrocortisone (ANUSOL-HC) 25 MG suppository UNWRAP AND INSERT 1 SUPPOSITORY INTO THE RECTUM 3 TIMES DAILY AS NEEDED (Patient not taking: Reported on 12/18/2020) 30 suppository 1   No current facility-administered medications on file prior to visit.    Allergies  Allergen Reactions   Citalopram Hydrobromide     REACTION: unspecified   Penicillins Other (See Comments)    REACTION: questionable    Past Medical History:  Diagnosis Date   Abnormal MRI 2007    nonspecific white matter abnormalities   Allergy    Anxiety    GERD (gastroesophageal reflux disease)    History of kidney stones    Hyperlipidemia    Migraines    OCULAR    Past Surgical History:  Procedure Laterality Date   CYSTOSCOPY/URETEROSCOPY/HOLMIUM LASER/STENT PLACEMENT Right 07/27/2018   Procedure: CYSTOSCOPY/URETEROSCOPY/HOLMIUM LASER/STENT PLACEMENT;  Surgeon: Abbie Sons, MD;  Location: [facility] ORS;  Service: Urology;  Laterality: Right;   URETEROLITHOTOMY  11/2001   US ECHOCARDIOGRAPHY  11/2004   normal, Carotids normal    Family History  Problem Relation Age of Onset   Diabetes Father    Heart disease Father    Dementia Father    Dementia Paternal Grandmother    CAD Neg Hx    Hypertension Neg Hx    Cancer Neg Hx     Social History   Socioeconomic History   Marital status: Married    Spouse name: Not on file   Number of children: Not on file   Years of education: Not on file   Highest education level: Not on file  Occupational History   Occupation: Designer, 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: [employer]  Tobacco Use   Smoking status: Never   Smokeless tobacco: Never  Vaping Use   Vaping Use: Never used  Substance and Sexual Activity   Alcohol use: Yes    Alcohol/week: 0.0 standard drinks    Comment: rare- once  a month   Drug use: No   Sexual activity: Not on file  Other Topics Concern   Not on file  Social History Narrative   Lives   Caffeine use:    Long standing relationship with partner--finally able to marry   Social Determinants of Health   Financial Resource Strain: Not on file  Food Insecurity: Not on file  Transportation Needs: Not on file  Physical Activity: Not on file  Stress: Not on file  Social Connections: Not on file  Intimate Partner Violence: Not on file   Review of Systems Hand fine--not dropping things Still has some left finger numbness---old injury from 2013     Objective:   Physical  Exam Constitutional:      Appearance: Normal appearance.  Neck:     Comments: Some trouble with full neck extension ---pain on left Tenderness along left lower trapezius to supraspinatus  SCM okay Musculoskeletal:     Comments: Left shoulder has normal ROM Arm normal  Neurological:     Mental Status: He is alert.     Comments: No arm weakness           Assessment & Plan:

## 2020-12-28 ENCOUNTER — Encounter: Payer: Self-pay | Admitting: Neurology

## 2020-12-28 ENCOUNTER — Ambulatory Visit: Payer: BC Managed Care – PPO | Admitting: Neurology

## 2020-12-28 VITALS — BP 124/77 | HR 80 | Ht 71.0 in | Wt 169.5 lb

## 2020-12-28 DIAGNOSIS — Z79899 Other long term (current) drug therapy: Secondary | ICD-10-CM | POA: Diagnosis not present

## 2020-12-28 DIAGNOSIS — G35 Multiple sclerosis: Secondary | ICD-10-CM | POA: Diagnosis not present

## 2020-12-28 DIAGNOSIS — R29898 Other symptoms and signs involving the musculoskeletal system: Secondary | ICD-10-CM | POA: Diagnosis not present

## 2020-12-28 DIAGNOSIS — M5126 Other intervertebral disc displacement, lumbar region: Secondary | ICD-10-CM | POA: Diagnosis not present

## 2020-12-28 NOTE — Progress Notes (Signed)
[facility]  PATIENT: Cameron [name] DOB: 11-20-1955  REFERRING DOCTOR OR PCP: Viviana Simpler MD SOURCE: Patient, notes from primary care, imaging and laboratory reports, MRI images personally reviewed.  _________________________________   HISTORICAL  CHIEF COMPLAINT:  Chief Complaint  Patient presents with   Follow-up    Rm 1, alone. Here for MS f/u, on Aubagio. Pt report being stable, no new or worsening in sx. Pt c/o of nerve discomfort on left leg. Taking gabapentin '100mg'$ .     HISTORY OF PRESENT ILLNESS:  Cameron [name] is a 65 y.o. man with relapsing remitting MS diagnosed 09/2019 but likely present since 2007 when he had visual disturbances. Marland Kitchen    Update  12/28/2020: He has been on Aubagio for 6 months and he tolerates it fairly well.   LFTs were fine.  The fatigue he had initially responded to modafinil 200 mg po around 10 am  Currently, gait is doing well.  No falls.   He goes up and down stars easily.  He may hold the bannister at times.  He is exercising and also does some PT .  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    No current visual issues.   He notes some urinary urgency but no incontinence.    For the most part, bladder is fine.    Hesitancy and constipation are better.  Fatigue improved on modafinil .  He sleeps well most noghts    Depression is better but he has some anxiety.    The word finding issues are better.  He notes headaches are better.   He had increased pain in December 2021 and neded surgery (L4L5 fusion) the disc was calcified.  He feels pain improved afterwards..,   The MRI of the lumbar spine beofre surgery shows a large herniated disc at L2-L3 towards the left compressing the left L3 nerve root.  Additionally  there is multilevel degenerative change s at L3L4 and L4L5 > other levels.   He had the [facility]/28/20 for the L2L3 HNP and had great benefit a couple days and continues to have some benefit.   He has pain and numbness in the left leg, mostly in the thigh, that is now intermittent.  He continues doing PT.   He is also doing yoga exercises   MS History:      He began to experience some neurologic symptoms in 2007.  That year, he had the onset of difficulties with eye movements.  Specifically, he was having difficulty tracking smoothly.   He also had some stumbling.  Over 2 monhs, his symptoms improved.   In 2013, he fell due to a left foot drop and continues to have mild left leg weakness. Since then he also has noted that he feels disoriented, has trouble coming up with the right words and veers to the left intermittently, especially in bright fluorescent lights as in some stores.  He also has had left hand numbness and reduced coordination for the last year.   He was diagnosed 11/05/2019 based on his history of optic neuritis, relapses with left-sided symptoms and additional changes in brain MRI and the presence of 2 foci in the cervical spine  Imaging studies:  MRIs of the brain from 10/11/2005 and 10/29/2012:  The MRI show multiple T2/FLAIR hyperintense foci, at least 20, with some in the periventricular white matter and some in the juxtacortical white matter.  There was U-fiber involvement of a couple foci in the posterior left frontal lobe.  Between 2007 and 2014, 2-3 more lesions developed (the new lesions were mostly deep white matter).  We do not have any images of the cervical or thoracic spine spine so the spinal cord could not be reviewed.  MRI of the brain 10/15/2019 showed scattered T2/FLAIR hyperintense foci, some in the periventricular white matter radially oriented to the ventricles and a couple in the juxtacortical white matter..  Compared to the MRI from 2014, there were 3 or 4 new lesions.  None  of the foci enhance.  MRI of the cervical and thoracic spine 10/15/2019 showed 2 T2 hyperintense foci within the spinal cord, 1 laterally to the left at C4 and the other posteriorly to the left adjacent to C6-C7.  There are degenerative changes at C4-C5 and C5-C6 with potential for left C5 nerve root compression.  There is mild spinal stenosis.  The thoracic spine showed a normal spinal cord and minimal degenerative changes at a couple levels that did not lead to nerve root compression or spinal stenosis.  MRI of the lumbar spine shows a large herniated disc at L2-L3 towards the left compressing the left L3 nerve root.  Additionally there is multilevel degenerative change elsewhere but with less potential for nerve root compression.   REVIEW OF SYSTEMS: Constitutional: No fevers, chills, sweats, or change in appetite Eyes: As above Ear, nose and throat: No hearing loss, ear pain, nasal congestion, sore throat Cardiovascular: No chest pain, palpitations Respiratory:  No shortness of breath at rest or with exertion.   No wheezes GastrointestinaI: No nausea, vomiting, diarrhea, abdominal pain, fecal incontinence Genitourinary:  No dysuria, urinary retention.  He has mild frequency and nocturia.  He has a history of kidney stones. Musculoskeletal:  No neck pain, back pain Integumentary: No rash, pruritus, skin lesions Neurological: as above Psychiatric: No depression at this time.  No anxiety Endocrine: No palpitations, diaphoresis, change in appetite, change in weigh or increased thirst Hematologic/Lymphatic:  No anemia, purpura, petechiae. Allergic/Immunologic: No itchy/runny eyes, nasal congestion, recent allergic reactions, rashes  ALLERGIES: Allergies  Allergen Reactions   Citalopram Hydrobromide     REACTION: unspecified   Penicillins Other (See Comments)    REACTION: questionable    HOME MEDICATIONS:  Current Outpatient Medications:    cyclobenzaprine (FLEXERIL) 10 MG tablet, TAKE  1/2 -1 TABLETS (5-10 MG TOTAL) BY MOUTH AT BEDTIME., Disp: 30 tablet, Rfl: 2   esomeprazole (NEXIUM) 20 MG capsule, Take 20 mg by mouth as needed. , Disp: , Rfl:    fluticasone (FLONASE) 50 MCG/ACT nasal spray, Place 2 sprays into both nostrils daily. In each nostril, Disp: 16 g, Rfl: 12   gabapentin (NEURONTIN) 100 MG capsule, Take 100-200 mg by mouth at bedtime as needed., Disp: , Rfl:    hydrocortisone (ANUSOL-HC) 25 MG suppository, UNWRAP AND INSERT 1 SUPPOSITORY INTO THE RECTUM 3 TIMES DAILY AS NEEDED, Disp: 30 suppository, Rfl: 1   ibuprofen (ADVIL,MOTRIN) 200 MG tablet, Take 200-400 mg by mouth every 6 (six) hours as needed., Disp: , Rfl:    modafinil (PROVIGIL) 200 MG tablet, Take 1 tablet (200 mg total) by mouth daily. Take one po qAM and 1/2 po qPM, Disp: 45 tablet, Rfl: 5   Teriflunomide (AUBAGIO) 14  MG TABS, Take 1 tablet by mouth daily., Disp: 30 tablet, Rfl: 2  PAST MEDICAL HISTORY: Past Medical History:  Diagnosis Date   Abnormal MRI 2007   nonspecific white matter abnormalities   Allergy    Anxiety    GERD (gastroesophageal reflux disease)    History of kidney stones    Hyperlipidemia    Migraines    OCULAR    PAST SURGICAL HISTORY: Past Surgical History:  Procedure Laterality Date   CYSTOSCOPY/URETEROSCOPY/HOLMIUM LASER/STENT PLACEMENT Right 07/27/2018   Procedure: CYSTOSCOPY/URETEROSCOPY/HOLMIUM LASER/STENT PLACEMENT;  Surgeon: Abbie Sons, MD;  Location: [facility] ORS;  Service: Urology;  Laterality: Right;   LUMBAR DISC SURGERY     URETEROLITHOTOMY  11/16/2001   US ECHOCARDIOGRAPHY  11/16/2004   normal, Carotids normal    FAMILY HISTORY: Family History  Problem Relation Age of Onset   Diabetes Father    Heart disease Father    Dementia Father    Dementia Paternal Grandmother    CAD Neg Hx    Hypertension Neg Hx    Cancer Neg Hx     SOCIAL HISTORY:  Social History   Socioeconomic History   Marital status: Married    Spouse name: Not on file    Number of children: Not on file   Years of education: Not on file   Highest education level: Not on file  Occupational History   Occupation: Designer, jewellery in Scientist, research (life sciences) Industrial/product designer: [employer]  Tobacco Use   Smoking status: Never   Smokeless tobacco: Never  Vaping Use   Vaping Use: Never used  Substance and Sexual Activity   Alcohol use: Yes    Alcohol/week: 0.0 standard drinks    Comment: rare- once a month   Drug use: No   Sexual activity: Not on file  Other Topics Concern   Not on file  Social History Narrative   Lives   Caffeine use:    Long standing relationship with partner--finally able to marry   Social Determinants of Health   Financial Resource Strain: Not on file  Food Insecurity: Not on file  Transportation Needs: Not on file  Physical Activity: Not on file  Stress: Not on file  Social Connections: Not on file  Intimate Partner Violence: Not on file     PHYSICAL EXAM  Vitals:   12/28/20 0838  BP: 124/77  Pulse: 80  Weight: 169 lb 8 oz (76.9 kg)  Height: '5\' 11"'$  (1.803 m)    Body mass index is 23.64 kg/m.   General: The patient is well-developed and well-nourished and in no acute distress  Neck:  The neck is nontender.  Skin: Extremities are without rash or  edema.  Neurologic Exam  Mental status: The patient is alert and oriented x 3 at the time of the examination. The patient has apparent normal recent and remote memory, with an apparently normal attention span and concentration ability.   Speech is normal.  Cranial nerves: Extraocular movements are full.Facial strength is fine. No dysarthria is noted.No obvious hearing deficits are noted.  Motor:  Muscle bulk is normal.   Tone is normal. Strength is  5 / 5 in all 4 extremities except 4+/5 proximal left leg..   Sensory: Sensory testing is intact to soft touch and vibration sensation in all 4 extremities.  Coordination: Cerebellar testing reveals good finger-nose-finger  and heel-to-shin bilaterally.  Gait and station: Station is normal.    The gait has a normal stride.  There is a  very slight left foot drop.  Tandem gait is mildly wide... Romberg is negative.   Reflexes: Deep tendon reflexes are symmetric and normal in arms but increased at knees, left > right (with spread) and ankles (no clonus).   Plantar responses are flexor.    DIAGNOSTIC DATA (LABS, IMAGING, TESTING) - I reviewed patient records, labs, notes, testing and imaging myself where available.  Lab Results  Component Value Date   WBC 4.8 06/25/2020   HGB 15.8 06/25/2020   HCT 45.8 06/25/2020   MCV 90 06/25/2020   PLT 231 06/25/2020      Component Value Date/Time   NA 136 11/04/2019 1433   K 4.7 11/04/2019 1433   CL 100 11/04/2019 1433   CO2 23 11/04/2019 1433   GLUCOSE 84 11/04/2019 1433   GLUCOSE 72 04/29/2019 1224   BUN 10 11/04/2019 1433   CREATININE 0.97 11/04/2019 1433   CREATININE 1.13 11/21/2016 1224   CALCIUM 9.4 11/04/2019 1433   PROT 7.0 06/25/2020 0913   ALBUMIN 4.3 06/25/2020 0913   AST 15 06/25/2020 0913   ALT 14 06/25/2020 0913   ALKPHOS 87 06/25/2020 0913   BILITOT 0.3 06/25/2020 0913   GFRNONAA 83 11/04/2019 1433   GFRAA 96 11/04/2019 1433   Lab Results  Component Value Date   CHOL 248 (H) 11/21/2016   HDL 43 11/21/2016   LDLCALC 155 (H) 11/21/2016   LDLDIRECT 136.0 10/21/2014   TRIG 249 (H) 11/21/2016   CHOLHDL 5.8 (H) 11/21/2016   No results found for: HGBA1C Lab Results  Component Value Date   VITAMINB12 430 09/19/2012   Lab Results  Component Value Date   TSH 5.45 (H) 10/16/2013       ASSESSMENT AND PLAN  Multiple sclerosis ([place]) - Plan: MR BRAIN W WO CONTRAST, Comprehensive metabolic panel, CBC with Differential/Platelet  Lumbar disc herniation  Left leg weakness - Plan: MR BRAIN W WO CONTRAST  High risk medication use - Plan: Comprehensive metabolic panel, CBC with Differential/Platelet  1.  Continue Aubagio.   Will check  labs today 2.  We will check MRI brain to determine if any breakthrough while on Aubagio.  If occurring, we will switch to a stronger DMT     3.  Back issues seem stable.  The discomfort in the left thigh is more likely to be related to the L2-L3 disc than 2 MS..   4.   Stay active and exercise as tolerated 5.   Return to see me in 4 months or sooner if there are new or worsening neurologic symptoms.    Cameron [name] A. Felecia Shelling, MD, [facility] [id number], [id number] PM Certified in Neurology, Clinical Neurophysiology, Sleep Medicine and Neuroimaging  [facility] [address], [place], [place] [address] [phone]

## 2020-12-29 LAB — COMPREHENSIVE METABOLIC PANEL
ALT: 12 IU/L (ref 0–44)
AST: 18 IU/L (ref 0–40)
Albumin/Globulin Ratio: 1.6 (ref 1.2–2.2)
Albumin: 4.6 g/dL (ref 3.8–4.8)
Alkaline Phosphatase: 103 IU/L (ref 44–121)
BUN/Creatinine Ratio: 9 — ABNORMAL LOW (ref 10–24)
BUN: 9 mg/dL (ref 8–27)
Bilirubin Total: 0.3 mg/dL (ref 0.0–1.2)
CO2: 22 mmol/L (ref 20–29)
Calcium: 9.4 mg/dL (ref 8.6–10.2)
Chloride: 100 mmol/L (ref 96–106)
Creatinine, Ser: 0.98 mg/dL (ref 0.76–1.27)
Globulin, Total: 2.8 g/dL (ref 1.5–4.5)
Glucose: 81 mg/dL (ref 65–99)
Potassium: 5.2 mmol/L (ref 3.5–5.2)
Sodium: 137 mmol/L (ref 134–144)
Total Protein: 7.4 g/dL (ref 6.0–8.5)
eGFR: 86 mL/min/{1.73_m2} (ref >59)

## 2020-12-29 LAB — CBC WITH DIFFERENTIAL/PLATELET
Basophils Absolute: 0 10*3/uL (ref 0.0–0.2)
Basos: 1 %
EOS (ABSOLUTE): 0 10*3/uL (ref 0.0–0.4)
Eos: 0 %
Hematocrit: 42.4 % (ref 37.5–51.0)
Hemoglobin: 14.8 g/dL (ref 13.0–17.7)
Immature Grans (Abs): 0 10*3/uL (ref 0.0–0.1)
Immature Granulocytes: 0 %
Lymphocytes Absolute: 1.6 10*3/uL (ref 0.7–3.1)
Lymphs: 36 %
MCH: 31.2 pg (ref 26.6–33.0)
MCHC: 34.9 g/dL (ref 31.5–35.7)
MCV: 90 fL (ref 79–97)
Monocytes Absolute: 0.4 10*3/uL (ref 0.1–0.9)
Monocytes: 10 %
Neutrophils Absolute: 2.4 10*3/uL (ref 1.4–7.0)
Neutrophils: 53 %
Platelets: 245 10*3/uL (ref 150–450)
RBC: 4.74 x10E6/uL (ref 4.14–5.80)
RDW: 11.6 % (ref 11.6–15.4)
WBC: 4.5 10*3/uL (ref 3.4–10.8)

## 2021-01-01 ENCOUNTER — Encounter: Payer: Self-pay | Admitting: Emergency Medicine

## 2021-01-01 ENCOUNTER — Ambulatory Visit
Admission: EM | Admit: 2021-01-01 | Discharge: 2021-01-01 | Disposition: A | Discharge status: Home / Self Care | Payer: BC Managed Care – PPO | Attending: Emergency Medicine | Admitting: Emergency Medicine

## 2021-01-01 ENCOUNTER — Other Ambulatory Visit: Payer: Self-pay

## 2021-01-01 DIAGNOSIS — U071 COVID-19: Secondary | ICD-10-CM

## 2021-01-01 MED ORDER — MOLNUPIRAVIR EUA 200MG CAPSULE: 4.0000 | ORAL_CAPSULE | Freq: Two times a day (BID) | ORAL | 0 refills | Status: AC

## 2021-01-01 NOTE — Discharge Instructions (Addendum)
Take molnupirovir as prescribed  For most people this is a self-limiting process and can take anywhere from 7 - 10 days to start feeling better. A cough can last up to 3 weeks. Pay special attention to handwashing as this can help prevent the spread of the virus.   Always read the labels of cough and cold medications as they may contain some of the ingredients below.  Rest, push lots of fluids (especially water), and utilize supportive care for symptoms. You may take acetaminophen (Tylenol) every 4-6 hours and ibuprofen every 6-8 hours for muscle pain, joint pain, headaches (you may also alternate these medications). Mucinex (guaifenesin) may be taken over the counter for cough as needed can loosen phlegm. Please read the instructions and take as directed.  Sudafed (pseudophedrine) is sold behind the counter and can help reduce nasal pressure; avoid taking this if you have high blood pressure or feel jittery. Sudafed PE (phenylephrine) can be a helpful, short-term, over-the-counter alternative to limit side effects or if you have high blood pressure.  Flonase nasal spray can help alleviate congestion and sinus pressure. Many patients choose Afrin as a nasal decongestant; do not use for more than 3 days for risk of rebound (increased symptoms after stopping medication).  Saline nasal sprays or rinses can also help nasal congestion (use bottled or sterile water). Warm tea with lemon and honey can sooth sore throat and cough, as can cough drops.   Return to clinic for high fever not improving with medications, chest pain, difficulty breathing, non-stop vomiting, or coughing blood. Follow-up with your primary care provider if symptoms do not improve as expected in the next 5-7 days.

## 2021-01-01 NOTE — Telephone Encounter (Signed)
Spoke to pt. He is going to [facility] tomorrow. Just had CMET done this week.

## 2021-01-01 NOTE — ED Provider Notes (Signed)
CHIEF COMPLAINT:   Chief Complaint  Patient presents with   Nasal Congestion   Fever   Generalized Body Aches     SUBJECTIVE/HPI:   Fever A very pleasant 65 y.o.Male presents today with body aches, congestion, and fever onset yesterday. COVID + at home today. Reports a history of MS. Interested in antiviral therapy. Patient does not report any shortness of breath, chest pain, palpitations, visual changes, weakness, tingling, headache, nausea, vomiting, diarrhea, fever, chills.   has a past medical history of Abnormal MRI (2007), Allergy, Anxiety, GERD (gastroesophageal reflux disease), History of kidney stones, Hyperlipidemia, and Migraines.  ROS:  Review of Systems  Constitutional:  Positive for fever.  See Subjective/HPI Medications, Allergies and Problem List personally reviewed in Epic today OBJECTIVE:   Vitals:   01/01/21 1733  BP: 119/82  Pulse: 84  Resp: 20  Temp: 97.8 F (36.6 C)  SpO2: 98%    Physical Exam   General: Appears well-developed and well-nourished. No acute distress.  HEENT Head: Normocephalic and atraumatic.   Ears: Hearing grossly intact, no drainage or visible deformity.  Nose: No nasal deviation.   Mouth/Throat: No stridor or tracheal deviation.   Eyes: Conjunctivae and EOM are normal. No eye drainage or scleral icterus bilaterally.  Neck: Normal range of motion, neck is supple.  Cardiovascular: Normal rate. Regular rhythm; no murmurs, gallops, or rubs.  Pulm/Chest: No respiratory distress. Breath sounds normal bilaterally without wheezes, rhonchi, or rales.  Neurological: Alert and oriented to person, place, and time.  Skin: Skin is warm and dry.  No rashes, lesions, abrasions or bruising noted to skin.   Psychiatric: Normal mood, affect, behavior, and thought content.   Vital signs and nursing note reviewed.   Patient stable and cooperative with examination. PROCEDURES:    LABS/X-RAYS/EKG/MEDS:   No results found for any visits on  01/01/21.  MEDICAL DECISION MAKING:   Patient presents with body aches, congestion, and fever onset yesterday. COVID + at home today. Reports a history of MS. Interested in antiviral therapy. Patient does not report any shortness of breath, chest pain, palpitations, visual changes, weakness, tingling, headache, nausea, vomiting, diarrhea, fever, chills.  Molnupirovir rx'd to the patient's preferred pharmacy and advised of at home care as outlined in the AVS to include rest, fluids, mucinex, tylenol/ibuprofen.  Advised to return to clinic for any new high fever not improved with medications, chest pain, difficulty breathing, nonstop vomiting or coughing up blood.  Follow-up with PCP within the next 5 to 7 days if symptoms do not improve as expected.  Patient verbalized understanding and agreed with treatment plan.  Patient stable upon discharge. ASSESSMENT/PLAN:  1. COVID-19 virus detected - molnupiravir EUA (LAGEVRIO) 200 mg CAPS capsule; Take 4 capsules (800 mg total) by mouth 2 (two) times daily for 5 days.  Dispense: 40 capsule; Refill: 0 Instructions about new medications and side effects provided.  Plan:   Discharge Instructions      Take molnupirovir as prescribed  For most people this is a self-limiting process and can take anywhere from 7 - 10 days to start feeling better. A cough can last up to 3 weeks. Pay special attention to handwashing as this can help prevent the spread of the virus.   Always read the labels of cough and cold medications as they may contain some of the ingredients below.  Rest, push lots of fluids (especially water), and utilize supportive care for symptoms. You may take acetaminophen (Tylenol) every 4-6 hours and ibuprofen every 6-8  hours for muscle pain, joint pain, headaches (you may also alternate these medications). Mucinex (guaifenesin) may be taken over the counter for cough as needed can loosen phlegm. Please read the instructions and take as directed.   Sudafed (pseudophedrine) is sold behind the counter and can help reduce nasal pressure; avoid taking this if you have high blood pressure or feel jittery. Sudafed PE (phenylephrine) can be a helpful, short-term, over-the-counter alternative to limit side effects or if you have high blood pressure.  Flonase nasal spray can help alleviate congestion and sinus pressure. Many patients choose Afrin as a nasal decongestant; do not use for more than 3 days for risk of rebound (increased symptoms after stopping medication).  Saline nasal sprays or rinses can also help nasal congestion (use bottled or sterile water). Warm tea with lemon and honey can sooth sore throat and cough, as can cough drops.   Return to clinic for high fever not improving with medications, chest pain, difficulty breathing, non-stop vomiting, or coughing blood. Follow-up with your primary care provider if symptoms do not improve as expected in the next 5-7 days.          Serafina Royals, [place] 01/01/21 1753

## 2021-01-01 NOTE — Telephone Encounter (Signed)
Pt called stating that he needs someone to get back to him about medication for covid

## 2021-01-01 NOTE — ED Triage Notes (Signed)
Pt here with covid sx and positive test since yesterday. Hx of MS. Has body aches, congestion, and fever.

## 2021-01-08 ENCOUNTER — Other Ambulatory Visit: Payer: Self-pay | Admitting: Neurology

## 2021-01-11 NOTE — Telephone Encounter (Signed)
Patient is up to date on his appointments. Patient is due for a refill on modafinil. [place] Controlled Substance Registry checked and is appropriate.

## 2021-01-12 ENCOUNTER — Telehealth: Payer: Self-pay

## 2021-01-12 NOTE — Telephone Encounter (Signed)
Called pt back. Advised PA approved 01/12/2021 - 01/12/2022. PA# [place] [id number] Non-Grandfathered [id number]. He can now pick up rx from pharmacy. He verbalized understanding and appreciation.

## 2021-01-12 NOTE — Telephone Encounter (Signed)
Pt called, can PA for modafinil be expedited, do not have any medication? Would like a call from the nurse

## 2021-01-12 NOTE — Telephone Encounter (Signed)
PA request for modafinil received. Completed via CMM. Key: [id number]. Sent to [facility]. Should have a determination within 1-3 business days.

## 2021-01-12 NOTE — Telephone Encounter (Signed)
We just received the prior authorization request today and was completed immediately. There is no way to expedite on our end. Usually they are quick to respond but not always. Will continue to monitor when approved and will inform pharmacy.

## 2021-02-03 ENCOUNTER — Encounter: Payer: Self-pay | Admitting: *Deleted

## 2021-02-04 ENCOUNTER — Ambulatory Visit: Payer: BC Managed Care – PPO | Admitting: Neurology

## 2021-02-04 ENCOUNTER — Encounter: Payer: Self-pay | Admitting: Neurology

## 2021-02-04 VITALS — BP 120/80 | HR 77 | Ht 70.5 in | Wt 172.2 lb

## 2021-02-04 DIAGNOSIS — H469 Unspecified optic neuritis: Secondary | ICD-10-CM | POA: Diagnosis not present

## 2021-02-04 DIAGNOSIS — M5416 Radiculopathy, lumbar region: Secondary | ICD-10-CM

## 2021-02-04 DIAGNOSIS — M21372 Foot drop, left foot: Secondary | ICD-10-CM

## 2021-02-04 DIAGNOSIS — R5383 Other fatigue: Secondary | ICD-10-CM | POA: Diagnosis not present

## 2021-02-04 DIAGNOSIS — G35 Multiple sclerosis: Secondary | ICD-10-CM

## 2021-02-04 MED ORDER — ALPRAZOLAM 0.5 MG PO TABS: ORAL_TABLET | ORAL | 0 refills | Status: DC

## 2021-02-04 NOTE — Progress Notes (Signed)
[facility]  PATIENT: Cameron [name] DOB: [birth year]  REFERRING DOCTOR OR PCP: Viviana Simpler MD SOURCE: Patient, notes from primary care, imaging and laboratory reports, MRI images personally reviewed.  _________________________________   HISTORICAL  CHIEF COMPLAINT:  Chief Complaint  Patient presents with   Follow-up    New rm, alone. Here to f/u for optic neuritis, on Aubagio. 2 weeks after last OV pt contracted COVID.  Pt  noticed some R eye discomfort. Pt saw his ophthalmologist and noticed in increase optic neuritis in R eye. sporadically but frequently will have some mental confusion, cognitively chaotic per pt. Could possibly be residual from COVID.     HISTORY OF PRESENT ILLNESS:  Cameron [name] is a 65 y.o. man with relapsing remitting MS diagnosed 09/2019 but likely present since 2007 when he had visual disturbances. Marland Kitchen   Update  02/04/2021: He developed Covid-19 about 01/02/2021.   Around 01/12/21, he had the onset of visual changes in the left eye.   He saw ophthalmology earlier ths week and wa found to have right optic edem and an exam c/w optic neuritis.  He does note mild color desaturation.      Since he Covid infection he also notes a mild cognitive fog.   The actual Covid-19 symptoms were mild, like a common cold.   He has been on Aubagio since early and he tolerates it fairly well.   LFTs were fine.  The fatigue is better with modafinil 100 mg po qAM and 200 mg around noon.  He is trying to be more active.    Currently, gait is doing well.  No falls.   He goes up and down stars easily.  He may hold the bannister at times.  He is exercising and also does some PT .  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    No current visual issues.    He notes some urinary urgency but no incontinence.    For the most part, bladder is fine.    Hesitancy and constipation are better.  Fatigue improved on modafinil .  He sleeps well most noghts    Depression is better but he has some anxiety.    The word finding issues are better.  He notes headaches are better.    He has pain in his feet, burning dysestrhetic.   It started before Aubagio but has worsened the past 6-7 months.   Gabapentin during daytime wa snot tolerated so he just takes at night.     I reviewd ophthalmology notes and OCT.   He has increased RNFL OD.     MS History:      He began to experience some neurologic symptoms in 2007.  That year, he had the onset of difficulties with eye movements.  Specifically, he was having difficulty tracking smoothly.   He also had some stumbling.  Over 2 monhs, his symptoms improved.   In 2013, he fell due to a left foot drop and continues to have mild left leg weakness. Since then he also has noted that he feels disoriented, has trouble coming up with the right words and veers to the left intermittently, especially in bright fluorescent lights as in some  stores.  He also has had left hand numbness and reduced coordination for the last year.   He was diagnosed 11/05/2019 based on his history of optic neuritis, relapses with left-sided symptoms and additional changes in brain MRI and the presence of 2 foci in the cervical spine  Imaging studies: MRIs of the brain from 10/11/2005 and 10/29/2012:  The MRI show multiple T2/FLAIR hyperintense foci, at least 20, with some in the periventricular white matter and some in the juxtacortical white matter.  There was U-fiber involvement of a couple foci in the posterior left frontal lobe.  Between 2007 and 2014, 2-3 more lesions developed (the new lesions were mostly deep white matter).  We do not have any images of the cervical or thoracic spine spine so the spinal cord could not be reviewed.  MRI of the brain  10/15/2019 showed scattered T2/FLAIR hyperintense foci, some in the periventricular white matter radially oriented to the ventricles and a couple in the juxtacortical white matter..  Compared to the MRI from 2014, there were 3 or 4 new lesions.  None of the foci enhance.  MRI of the cervical and thoracic spine 10/15/2019 showed 2 T2 hyperintense foci within the spinal cord, 1 laterally to the left at C4 and the other posteriorly to the left adjacent to C6-C7.  There are degenerative changes at C4-C5 and C5-C6 with potential for left C5 nerve root compression.  There is mild spinal stenosis.  The thoracic spine showed a normal spinal cord and minimal degenerative changes at a couple levels that did not lead to nerve root compression or spinal stenosis.  MRI of the lumbar spine shows a large herniated disc at L2-L3 towards the left compressing the left L3 nerve root.  Additionally there is multilevel degenerative change elsewhere but with less potential for nerve root compression.   REVIEW OF SYSTEMS: Constitutional: No fevers, chills, sweats, or change in appetite Eyes: As above Ear, nose and throat: No hearing loss, ear pain, nasal congestion, sore throat Cardiovascular: No chest pain, palpitations Respiratory:  No shortness of breath at rest or with exertion.   No wheezes GastrointestinaI: No nausea, vomiting, diarrhea, abdominal pain, fecal incontinence Genitourinary:  No dysuria, urinary retention.  He has mild frequency and nocturia.  He has a history of kidney stones. Musculoskeletal:  No neck pain, back pain Integumentary: No rash, pruritus, skin lesions Neurological: as above Psychiatric: No depression at this time.  No anxiety Endocrine: No palpitations, diaphoresis, change in appetite, change in weigh or increased thirst Hematologic/Lymphatic:  No anemia, purpura, petechiae. Allergic/Immunologic: No itchy/runny eyes, nasal congestion, recent allergic reactions,  rashes  ALLERGIES: Allergies  Allergen Reactions   Citalopram Hydrobromide     REACTION: unspecified   Penicillins Other (See Comments)    REACTION: questionable    HOME MEDICATIONS:  Current Outpatient Medications:    cyclobenzaprine (FLEXERIL) 10 MG tablet, TAKE 1/2 -1 TABLETS (5-10 MG TOTAL) BY MOUTH AT BEDTIME., Disp: 30 tablet, Rfl: 2   esomeprazole (NEXIUM) 20 MG capsule, Take 20 mg by mouth as needed. , Disp: , Rfl:    fluticasone (FLONASE) 50 MCG/ACT nasal spray, Place 2 sprays into both nostrils daily. In each nostril, Disp: 16 g, Rfl: 12   gabapentin (NEURONTIN) 100 MG capsule, Take 100-200 mg by mouth at bedtime as needed., Disp: , Rfl:    hydrocortisone (ANUSOL-HC) 25 MG suppository, UNWRAP AND INSERT 1 SUPPOSITORY INTO THE RECTUM 3 TIMES DAILY AS NEEDED, Disp: 30 suppository, Rfl: 1   ibuprofen (ADVIL,MOTRIN)  200 MG tablet, Take 200-400 mg by mouth every 6 (six) hours as needed., Disp: , Rfl:    modafinil (PROVIGIL) 200 MG tablet, TAKE 1 TABLET IN THE MORNING AND 1/2 TABLET IN THE EVENING, Disp: 45 tablet, Rfl: 5   Teriflunomide (AUBAGIO) 14 MG TABS, Take 1 tablet by mouth daily., Disp: 30 tablet, Rfl: 2   ALPRAZolam (XANAX) 0.5 MG tablet, Take 1-2 tablets by mouth 30 min prior to test. Can take additional tablet at time of test if needed. Can cause drowsiness, must have driver to and from test., Disp: 3 tablet, Rfl: 0  PAST MEDICAL HISTORY: Past Medical History:  Diagnosis Date   Abnormal MRI 2007   nonspecific white matter abnormalities   Allergy    Anxiety    GERD (gastroesophageal reflux disease)    History of kidney stones    Hyperlipidemia    Migraines    OCULAR    PAST SURGICAL HISTORY: Past Surgical History:  Procedure Laterality Date   CYSTOSCOPY/URETEROSCOPY/HOLMIUM LASER/STENT PLACEMENT Right 07/27/2018   Procedure: CYSTOSCOPY/URETEROSCOPY/HOLMIUM LASER/STENT PLACEMENT;  Surgeon: Abbie Sons, MD;  Location: [facility] OR[facility];  Service: Urology;   Laterality: Right;   LUMBAR DISC SURGERY     URETEROLITHOTOMY  11/16/2001   US ECHOCARDIOGRAPHY  11/16/2004   normal, Carotids normal    FAMILY HISTORY: Family History  Problem Relation Age of Onset   Diabetes Father    Heart disease Father    Dementia Father    Dementia Paternal Grandmother    CAD Neg Hx    Hypertension Neg Hx    Cancer Neg Hx     SOCIAL HISTORY:  Social History   Socioeconomic History   Marital status: Married    Spouse name: Not on file   Number of children: Not on file   Years of education: Not on file   Highest education level: Not on file  Occupational History   Occupation: Designer, jewellery in Scientist, research (life sciences) Industrial/product designer: [employer]  Tobacco Use   Smoking status: Never   Smokeless tobacco: Never  Vaping Use   Vaping Use: Never used  Substance and Sexual Activity   Alcohol use: Yes    Alcohol/week: 0.0 standard drinks    Comment: rare- once a month   Drug use: No   Sexual activity: Not on file  Other Topics Concern   Not on file  Social History Narrative   Lives   Caffeine use:    Long standing relationship with partner--finally able to marry   Social Determinants of Health   Financial Resource Strain: Not on file  Food Insecurity: Not on file  Transportation Needs: Not on file  Physical Activity: Not on file  Stress: Not on file  Social Connections: Not on file  Intimate Partner Violence: Not on file     PHYSICAL EXAM  Vitals:   02/04/21 1121  BP: 120/80  Pulse: 77  Weight: 172 lb 3.2 oz (78.1 kg)  Height: 5' 10.5" (1.791 m)    Body mass index is 24.36 kg/m.   General: The patient is well-developed and well-nourished and in no acute distress  Eyes: Mild optic disc edema OD.  Reduced color vision OD.  1+ APD OD  Neck:  The neck is nontender.  Skin: Extremities are without rash or  edema.  Neurologic Exam  Mental status: The patient is alert and oriented x 3 at the time of the examination. The  patient has apparent normal recent and remote memory, with an  apparently normal attention span and concentration ability.   Speech is normal.  Cranial nerves: Extraocular movements are full. Facial strength is fine. No dysarthria is noted.No obvious hearing deficits are noted.  Motor:  Muscle bulk is normal.   Tone is normal. Strength is  5 / 5 in all 4 extremities except 4+/5 proximal left leg..   Sensory: Sensory testing is intact to soft touch and vibration sensation in all 4 extremities.  Coordination: Cerebellar testing reveals good finger-nose-finger and heel-to-shin bilaterally.  Gait and station: Station is normal.    The gait has a normal stride.  There is a very slight left foot drop.  Tandem gait is mildly wide... Romberg is negative.   Reflexes: Deep tendon reflexes are symmetric and normal in arms but increased at knees, left > right (with spread) and ankles (no clonus).   Plantar responses are flexor.    DIAGNOSTIC DATA (LABS, IMAGING, TESTING) - I reviewed patient records, labs, notes, testing and imaging myself where available.  Lab Results  Component Value Date   WBC 4.5 12/28/2020   HGB 14.8 12/28/2020   HCT 42.4 12/28/2020   MCV 90 12/28/2020   PLT 245 12/28/2020      Component Value Date/Time   NA 137 12/28/2020 0937   K 5.2 12/28/2020 0937   CL 100 12/28/2020 0937   CO2 22 12/28/2020 0937   GLUCOSE 81 12/28/2020 0937   GLUCOSE 72 04/29/2019 1224   BUN 9 12/28/2020 0937   CREATININE 0.98 12/28/2020 0937   CREATININE 1.13 11/21/2016 1224   CALCIUM 9.4 12/28/2020 0937   PROT 7.4 12/28/2020 0937   ALBUMIN 4.6 12/28/2020 0937   AST 18 12/28/2020 0937   ALT 12 12/28/2020 0937   ALKPHOS 103 12/28/2020 0937   BILITOT 0.3 12/28/2020 0937   GFRNONAA 83 11/04/2019 1433   GFRAA 96 11/04/2019 1433   Lab Results  Component Value Date   CHOL 248 (H) 11/21/2016   HDL 43 11/21/2016   LDLCALC 155 (H) 11/21/2016   LDLDIRECT 136.0 10/21/2014   TRIG 249 (H)  11/21/2016   CHOLHDL 5.8 (H) 11/21/2016   No results found for: HGBA1C Lab Results  Component Value Date   VITAMINB12 430 09/19/2012   Lab Results  Component Value Date   TSH 5.45 (H) 10/16/2013       ASSESSMENT AND PLAN  Multiple sclerosis (HCC)  Right optic neuritis  Lumbar radiculopathy  Other fatigue  Left foot drop  1.  He has had an exacerbation with right optic neuritis.  We will check an MRI of the brain to see if there has been additional breakthrough activity.  He has felt good on Aubagio and would like to continue.  However, we discussed that if he does have a lot of breakthrough activity either another exacerbation or if the MRI of the brain shows that there has been some new lesions I would strongly urged him to switch to a stronger medication.  We could discuss options based on the outcomes of the MRI.  Until then we will continue the Aubagio. The exacerbation occurred2 weeks after a Covid infection and there could be an association.   2.   He is already improving.   No indication for steroid.     3.  Back issues seem stable.  The discomfort in the left thigh is more likely to be related to the L2-L3 disc than 2 MS..   4.   Stay active and exercise as tolerated 5.  Return to see me in 4 months or sooner if there are new or worsening neurologic symptoms.    Calee Nugent A. Felecia Shelling, MD, [facility] 94/10/6806, 8:11 PM Certified in Neurology, Clinical Neurophysiology, Sleep Medicine and Neuroimaging  [facility] [address] [place], [place] [address] [phone]

## 2021-02-09 ENCOUNTER — Ambulatory Visit (INDEPENDENT_AMBULATORY_CARE_PROVIDER_SITE_OTHER): Payer: BC Managed Care – PPO

## 2021-02-09 DIAGNOSIS — G35 Multiple sclerosis: Secondary | ICD-10-CM

## 2021-02-09 DIAGNOSIS — R29898 Other symptoms and signs involving the musculoskeletal system: Secondary | ICD-10-CM

## 2021-02-09 MED ORDER — GADOBENATE DIMEGLUMINE 529 MG/ML IV SOLN
15.0000 mL | Freq: Once | INTRAVENOUS | Status: AC | PRN
  Administered 2021-02-09: 15 mL via INTRAVENOUS

## 2021-03-01 ENCOUNTER — Other Ambulatory Visit: Payer: Self-pay | Admitting: Neurology

## 2021-03-13 ENCOUNTER — Other Ambulatory Visit: Payer: Self-pay | Admitting: Neurology

## 2021-03-23 ENCOUNTER — Other Ambulatory Visit: Payer: BC Managed Care – PPO

## 2021-03-31 ENCOUNTER — Ambulatory Visit: Payer: BC Managed Care – PPO | Admitting: Urology

## 2021-05-07 ENCOUNTER — Encounter: Payer: BC Managed Care – PPO | Admitting: Internal Medicine

## 2021-05-10 ENCOUNTER — Encounter: Payer: Self-pay | Admitting: Internal Medicine

## 2021-05-11 ENCOUNTER — Encounter: Payer: Self-pay | Admitting: Neurology

## 2021-05-11 ENCOUNTER — Encounter: Payer: BC Managed Care – PPO | Admitting: Internal Medicine

## 2021-05-15 ENCOUNTER — Other Ambulatory Visit: Payer: Self-pay | Admitting: Neurology

## 2021-06-10 ENCOUNTER — Other Ambulatory Visit: Payer: Self-pay | Admitting: Neurology

## 2021-06-10 NOTE — Telephone Encounter (Signed)
Please refill as provider is out today

## 2021-06-10 NOTE — Telephone Encounter (Signed)
Received refill request for modafinil.  Last OV was on 02/04/21.  Next OV is scheduled for 07/05/21 .  Last RX was written on 05/11/21 for 45 tabs.    Drug Database has been reviewed.

## 2021-07-05 ENCOUNTER — Encounter: Payer: Self-pay | Admitting: Neurology

## 2021-07-05 ENCOUNTER — Ambulatory Visit: Payer: BC Managed Care – PPO | Admitting: Neurology

## 2021-07-05 VITALS — BP 117/84 | HR 86 | Ht 70.5 in | Wt 172.0 lb

## 2021-07-05 DIAGNOSIS — H469 Unspecified optic neuritis: Secondary | ICD-10-CM | POA: Diagnosis not present

## 2021-07-05 DIAGNOSIS — G35 Multiple sclerosis: Secondary | ICD-10-CM

## 2021-07-05 DIAGNOSIS — Z79899 Other long term (current) drug therapy: Secondary | ICD-10-CM

## 2021-07-05 DIAGNOSIS — M5126 Other intervertebral disc displacement, lumbar region: Secondary | ICD-10-CM | POA: Diagnosis not present

## 2021-07-05 DIAGNOSIS — M5416 Radiculopathy, lumbar region: Secondary | ICD-10-CM

## 2021-07-05 MED ORDER — MODAFINIL 200 MG PO TABS: ORAL_TABLET | ORAL | 5 refills | Status: DC

## 2021-07-05 NOTE — Progress Notes (Addendum)
? ?[facility] ? ?PATIENT: Cameron [name] ?DOB: [birth year] ? ?[place] PCP: Viviana Simpler MD ?SOURCE: Patient, notes from primary care, imaging and laboratory reports, MRI images personally reviewed. ? ?_________________________________ ? ? ?HISTORICAL ? ?CHIEF COMPLAINT:  ?Chief Complaint  ?Patient presents with  ? Follow-up  ?  Rm 1, alone. Here for 6 month MS f/u, on Aubagio and tolerating well. Pt had an episode of Vertigo in feb. Has had more neuropathy in his feet and wearing compression socks. Pt states he had never had neuropathy until starting txt for MS.   ? ? ?HISTORY OF PRESENT ILLNESS:  ?Cameron [name] is a 66 y.o. man with relapsing remitting MS diagnosed 09/2019 but likely present since 2007 when he had visual disturbances. .  ? ?Update  07/05/21: ?His back pain is doing better since he had an injection in his left hip (may have been SI joint).    The right hip is mildly better.   MRI had shown a herniated disc at L2-L3 that could be compressing the left L3 nerve root. ? ?He has been on Aubagio since early and he tolerates it fairly well.   LFTs were fine.  The fatigue is better with modafinil 100 mg po qAM and 200 mg around noon.  He is trying to be more active.   ? ?He developed Covid-19 about 01/02/2021.   Around 01/12/21, he had the onset of visual changes in the left eye.   He saw ophthalmology earlier ths week and wa found to have right optic edem and an exam c/w optic neuritis.  He does note mild color desaturation.    It is unclear if this was more related to the acute COVID infection or to his MS as he has not had other exacerbations. ? ?Since he Covid infection he also notes a mild cognitive fog.   The actual Covid-19 symptoms were mild, like a common cold.  ? ?Currently, gait is doing well.  He has no falls but feels balance is slightly worse and he uses the bannister now.   He is exercising and also does some PT .  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    No current visual issues.   He notes some urinary urgency but no incontinence.    For the most part, bladder is fine.    Hesitancy and constipation are better. ? ?Fatigue has been improved on modafinil 200-100.  Often he has more fatigue later in the day.  He sleeps well most noghts.  Gabapentin has helped some of the nighttime dysesthesias.   Depression is better but he has some anxiety.    The word finding issues are better.  He notes headaches are better.  ?  ? ?MS History:      ?He began to experience some neurologic symptoms in 2007.  That year, he had the onset of difficulties with eye movements.  Specifically, he was having difficulty tracking smoothly.   He also had some stumbling.  Over 2 monhs, his symptoms improved.   In 2013, he fell due to a left foot drop and continues to have mild left leg weakness. Since then he also has noted that he feels disoriented,  has trouble coming up with the right words and veers to the left intermittently, especially in bright fluorescent lights as in some stores.  He also has had left hand numbness and reduced coordination for the last year.   He was diagnosed 11/05/2019 based on his history of optic neuritis, relapses with left-sided symptoms and additional changes in brain MRI and the presence of 2 foci in the cervical spine ? ?Imaging studies: ?MRIs of the brain from 10/15/05 and 10/29/2012:  The MRI show multiple T2/FLAIR hyperintense foci, at least 20, with some in the periventricular white matter and some in the juxtacortical white matter.  There was U-fiber involvement of a couple foci in the posterior left frontal lobe.  Between 2007 and 2014, 2-3 more lesions developed (the new lesions were mostly deep white matter).  We do not have any images of  the cervical or thoracic spine spine so the spinal cord could not be reviewed. ? ?MRI of the brain 10/15/2019 showed scattered T2/FLAIR hyperintense foci, some in the periventricular white matter radially oriented to the ventricles and a couple in the juxtacortical white matter..  Compared to the MRI from 2014, there were 3 or 4 new lesions.  None of the foci enhance. ? ?MRI brain 04/29/2020 scattered T2/FLAIR hyperintense foci in the hemispheres in a pattern consistent with chronic demyelinating plaque associated with multiple sclerosis.  None of the foci appear to be acute.  They do not enhance.  Compared to the MRI dated 04/29/2020, there are no new lesions. ? ?MRI of the cervical and thoracic spine 10/15/2019 showed 2 T2 hyperintense foci within the spinal cord, 1 laterally to the left at C4 and the other posteriorly to the left adjacent to C6-C7.  There are degenerative changes at C4-C5 and C5-C6 with potential for left C5 nerve root compression.  There is mild spinal stenosis.  The thoracic spine showed a normal spinal cord and minimal degenerative changes at a couple levels that did not lead to nerve root compression or spinal stenosis. ? ?MRI of the lumbar spine shows a large herniated disc at L2-L3 towards the left compressing the left L3 nerve root.  Additionally there is multilevel degenerative change elsewhere but with less potential for nerve root compression.  ? ?REVIEW OF SYSTEMS: ?Constitutional: No fevers, chills, sweats, or change in appetite ?Eyes: As above ?Ear, nose and throat: No hearing loss, ear pain, nasal congestion, sore throat ?Cardiovascular: No chest pain, palpitations ?Respiratory:  No shortness of breath at rest or with exertion.   No wheezes ?GastrointestinaI: No nausea, vomiting, diarrhea, abdominal pain, fecal incontinence ?Genitourinary:  No dysuria, urinary retention.  He has mild frequency and nocturia.  He has a history of kidney stones. ?Musculoskeletal:  No neck pain, back  pain ?Integumentary: No rash, pruritus, skin lesions ?Neurological: as above ?Psychiatric: No depression at this time.  No anxiety ?Endocrine: No palpitations, diaphoresis, change in appetite, change in weigh or increased thirst ?Hematologic/Lymphatic:  No anemia, purpura, petechiae. ?Allergic/Immunologic: No itchy/runny eyes, nasal congestion, recent allergic reactions, rashes ? ?ALLERGIES: ?Allergies  ?Allergen Reactions  ? Citalopram Hydrobromide   ?  REACTION: unspecified  ? Penicillins Other (See Comments)  ?  REACTION: questionable  ? ? ?HOME MEDICATIONS: ? ?Current Outpatient Medications:  ?  ALPRAZolam (XANAX) 0.5 MG tablet, Take 1-2 tablets by mouth 30 min prior to test. Can take additional tablet at time of test if needed. Can cause drowsiness, must have driver to and from test., Disp: 3 tablet, Rfl: 0 ?  cyclobenzaprine (FLEXERIL) 10 MG tablet, TAKE 1/2 -1 TABLETS (5-10 MG TOTAL) BY MOUTH AT BEDTIME., Disp: 30 tablet, Rfl: 2 ?  esomeprazole (NEXIUM) 20 MG capsule, Take 20 mg by mouth as needed. , Disp: , Rfl:  ?  fluticasone (FLONASE) 50 MCG/ACT nasal spray, Place 2 sprays into both nostrils daily. In each nostril, Disp: 16 g, Rfl: 12 ?  gabapentin (NEURONTIN) 100 MG capsule, TAKE 1-2 CAPSULES (100-200 MG TOTAL) BY MOUTH AT BEDTIME., Disp: 180 capsule, Rfl: 2 ?  HYDROcodone-acetaminophen (NORCO) 7.5-325 MG tablet, Take 1 tablet by mouth every 6 (six) hours as needed., Disp: , Rfl:  ?  hydrocortisone (ANUSOL-HC) 25 MG suppository, UNWRAP AND INSERT 1 SUPPOSITORY INTO THE RECTUM 3 TIMES DAILY AS NEEDED, Disp: 30 suppository, Rfl: 1 ?  ibuprofen (ADVIL,MOTRIN) 200 MG tablet, Take 200-400 mg by mouth every 6 (six) hours as needed., Disp: , Rfl:  ?  Teriflunomide (AUBAGIO) 14 MG TABS, Take 1 tablet by mouth daily., Disp: 30 tablet, Rfl: 5 ?  modafinil (PROVIGIL) 200 MG tablet, One pill po qAM and one po qnoon, Disp: 60 tablet, Rfl: 5 ? ?PAST MEDICAL HISTORY: ?Past Medical History:  ?Diagnosis Date  ? Abnormal  MRI 2007  ? nonspecific white matter abnormalities  ? Allergy   ? Anxiety   ? GERD (gastroesophageal reflux disease)   ? History of kidney stones   ? Hyperlipidemia   ? Migraines   ? OCULAR  ? ? ?PAST SURGICAL H

## 2021-07-07 ENCOUNTER — Encounter: Payer: Self-pay | Admitting: Internal Medicine

## 2021-07-07 ENCOUNTER — Ambulatory Visit (INDEPENDENT_AMBULATORY_CARE_PROVIDER_SITE_OTHER): Payer: BC Managed Care – PPO | Admitting: Internal Medicine

## 2021-07-07 ENCOUNTER — Other Ambulatory Visit: Payer: Self-pay

## 2021-07-07 VITALS — BP 110/80 | HR 95 | Temp 97.7°F | Ht 70.0 in | Wt 168.0 lb

## 2021-07-07 DIAGNOSIS — K219 Gastro-esophageal reflux disease without esophagitis: Secondary | ICD-10-CM

## 2021-07-07 DIAGNOSIS — G35 Multiple sclerosis: Secondary | ICD-10-CM

## 2021-07-07 DIAGNOSIS — M5416 Radiculopathy, lumbar region: Secondary | ICD-10-CM | POA: Diagnosis not present

## 2021-07-07 DIAGNOSIS — Z0000 Encounter for general adult medical examination without abnormal findings: Secondary | ICD-10-CM | POA: Diagnosis not present

## 2021-07-07 DIAGNOSIS — Z125 Encounter for screening for malignant neoplasm of prostate: Secondary | ICD-10-CM

## 2021-07-07 LAB — HEPATIC FUNCTION PANEL
ALT: 13 U/L (ref 0–53)
AST: 18 U/L (ref 0–37)
Albumin: 4.7 g/dL (ref 3.5–5.2)
Alkaline Phosphatase: 80 U/L (ref 39–117)
Bilirubin, Direct: 0.1 mg/dL (ref 0.0–0.3)
Total Bilirubin: 0.7 mg/dL (ref 0.2–1.2)
Total Protein: 7.4 g/dL (ref 6.0–8.3)

## 2021-07-07 LAB — CBC
HCT: 42.6 % (ref 39.0–52.0)
Hemoglobin: 14.6 g/dL (ref 13.0–17.0)
MCHC: 34.2 g/dL (ref 30.0–36.0)
MCV: 91.8 fl (ref 78.0–100.0)
Platelets: 245 10*3/uL (ref 150.0–400.0)
RBC: 4.65 Mil/uL (ref 4.22–5.81)
RDW: 13.5 % (ref 11.5–15.5)
WBC: 4.8 10*3/uL (ref 4.0–10.5)

## 2021-07-07 LAB — RENAL FUNCTION PANEL
Albumin: 4.7 g/dL (ref 3.5–5.2)
BUN: 11 mg/dL (ref 6–23)
CO2: 27 mEq/L (ref 19–32)
Calcium: 9.6 mg/dL (ref 8.4–10.5)
Chloride: 101 mEq/L (ref 96–112)
Creatinine, Ser: 0.95 mg/dL (ref 0.40–1.50)
GFR: 84.37 mL/min (ref >60.00)
Glucose, Bld: 76 mg/dL (ref 70–99)
Phosphorus: 3 mg/dL (ref 2.3–4.6)
Potassium: 4.2 mEq/L (ref 3.5–5.1)
Sodium: 137 mEq/L (ref 135–145)

## 2021-07-07 LAB — VITAMIN B12: Vitamin B-12: 239 pg/mL (ref 211–911)

## 2021-07-07 LAB — T4, FREE: Free T4: 0.62 ng/dL (ref 0.60–1.60)

## 2021-07-07 LAB — PSA: PSA: 0.79 ng/mL (ref 0.10–4.00)

## 2021-07-07 LAB — SEDIMENTATION RATE: Sed Rate: 9 mm/hr (ref 0–20)

## 2021-07-07 MED ORDER — HYDROCORTISONE ACETATE 25 MG RE SUPP: RECTAL | 1 refills | Status: DC

## 2021-07-07 NOTE — Progress Notes (Signed)
? ?Subjective:  ? ? Patient ID: Cameron [name], male    DOB: 06/03/1955, 66 y.o.   MRN: [id number] ? ?HPI ?Here for physical ? ?Doing okay ?Aubagio is controlling the MS for the most part ?Has repeat MRI scheduled in 6 months ? ?Modafinil now bid is helping with energy ? 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 ?Neuropathy in feet has worsened since back surgery ?Compression socks in the day have helped ?Some purplish discoloration ? ?Current Outpatient Medications on File Prior to Visit  ?Medication Sig Dispense Refill  ? ALPRAZolam (XANAX) 0.5 MG tablet Take 1-2 tablets by mouth 30 min prior to test. Can take additional tablet at time of test if needed. Can cause drowsiness, must have driver to and from test. 3 tablet 0  ? cyclobenzaprine (FLEXERIL) 10 MG tablet TAKE 1/2 -1 TABLETS (5-10 MG TOTAL) BY MOUTH AT BEDTIME. 30 tablet 2  ? esomeprazole (NEXIUM) 20 MG capsule Take 20 mg by mouth as needed.     ? fluticasone (FLONASE) 50 MCG/ACT nasal spray Place 2 sprays into both nostrils daily. In each nostril 16 g 12  ? gabapentin (NEURONTIN) 100 MG capsule TAKE 1-2 CAPSULES (100-200 MG TOTAL) BY MOUTH AT BEDTIME. 180 capsule 2  ? HYDROcodone-acetaminophen (NORCO) 7.5-325 MG tablet Take 1 tablet by mouth every 6 (six) hours as needed.    ? hydrocortisone (ANUSOL-HC) 25 MG suppository UNWRAP AND INSERT 1 SUPPOSITORY INTO THE RECTUM 3 TIMES DAILY AS NEEDED 30 suppository 1  ? ibuprofen (ADVIL,MOTRIN) 200 MG tablet Take 200-400 mg by mouth every 6 (six) hours as needed.    ? modafinil (PROVIGIL) 200 MG tablet One pill po qAM and one po qnoon 60 tablet 5  ? Teriflunomide (AUBAGIO) 14 MG TABS Take 1 tablet by mouth daily. 30 tablet 5  ? ?No current facility-administered medications on file prior to visit.  ? ? ?Allergies  ?Allergen Reactions  ? Citalopram  Hydrobromide   ?  REACTION: unspecified  ? Penicillins Other (See Comments)  ?  REACTION: questionable  ? ? ?Past Medical History:  ?Diagnosis Date  ? Abnormal MRI 2007  ? nonspecific white matter abnormalities  ? Allergy   ? Anxiety   ? GERD (gastroesophageal reflux disease)   ? History of kidney stones   ? Hyperlipidemia   ? Migraines   ? OCULAR  ? ? ?Past Surgical History:  ?Procedure Laterality Date  ? CYSTOSCOPY/URETEROSCOPY/HOLMIUM LASER/STENT PLACEMENT Right 07/27/2018  ? Procedure: CYSTOSCOPY/URETEROSCOPY/HOLMIUM LASER/STENT PLACEMENT;  Surgeon: Abbie Sons, MD;  Location: [facility] ORS;  Service: Urology;  Laterality: Right;  ? LUMBAR DISC SURGERY    ? URETEROLITHOTOMY  11/16/2001  ? US ECHOCARDIOGRAPHY  11/16/2004  ? normal, Carotids normal  ? ? ?Family History  ?Problem Relation Age of Onset  ? Diabetes Father   ? Heart disease Father   ? Dementia Father   ? Dementia Paternal Grandmother   ? CAD Neg Hx   ? Hypertension Neg Hx   ? Cancer Neg Hx   ? ? ?Social History  ? ?Socioeconomic History  ? Marital status: Married  ?  Spouse name: Not on file  ? Number of children: Not on file  ? Years of education: Not on file  ? Highest education level: Not on file  ?Occupational History  ? Occupation: Designer, jewellery in Emergency planning/management officer  ?  Employer: [employer]  ?Tobacco Use  ? Smoking status: Never  ?  Passive exposure: Past  ? Smokeless tobacco: Never  ?Vaping Use  ? Vaping Use: Never used  ?Substance and Sexual Activity  ? Alcohol use: Yes  ?  Alcohol/week: 0.0 standard drinks  ?  Comment: rare- once a month  ? Drug use: No  ? Sexual activity: Not on file  ?Other Topics Concern  ? Not on file  ?Social History Narrative  ? Lives  ? Caffeine use:   ? Long standing relationship with partner--finally able to marry  ? ?Social Determinants of Health  ? ?Financial Resource Strain: Not on file  ?Food Insecurity: Not on file  ?Transportation Needs: Not on file  ?Physical Activity: Not on file  ?Stress: Not on  file  ?Social Connections: Not on file  ?Intimate Partner Violence: Not on file  ? ?Review of Systems  ?Constitutional:  Positive for fatigue. Negative for unexpected weight change.  ?     Not exercising lately--mostly due to hip issues ?Plans to restart now  ?HENT:  Negative for dental problem and hearing loss.   ?     Has static sound in ears--no ringing ?Keeps up with dentist  ?Eyes:   ?     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   ?Cardiovascular:  Positive for leg swelling. Negative for chest pain.  ?     Occ palpitations with anxiety/caffeine (mild)  ?Gastrointestinal:  Negative for blood in stool and constipation.  ?     Bowels good with miralax in hot tea in AM (apple and bran also) ?No heartburn  ?Endocrine: Negative for polydipsia and polyuria.  ?Genitourinary:   ?     Mild dribbling ?No sexual problems---limited now  ?Musculoskeletal:  Positive for arthralgias and back pain. Negative for joint swelling.  ?Skin:  Negative for rash.  ?Allergic/Immunologic: Positive for environmental allergies. Negative for immunocompromised state.  ?     Flonase helps ?Less of an issue  ?Neurological:  Positive for headaches. Negative for syncope.  ?     Dizziness related to eye/MS stuff  ?Hematological:  Negative for adenopathy. Does not bruise/bleed easily.  ?Psychiatric/Behavioral:  Negative for dysphoric mood. The patient is nervous/anxious.   ?     Different sleep schedules but doing okay  ? ?   ?Objective:  ? Physical Exam  ? ? ? ? ?   ?Assessment & Plan:  ? ?

## 2021-07-07 NOTE — Assessment & Plan Note (Signed)
Healthy ?Will be getting back to exercise ?FIT ?Discussed PSA---will check ?Holding off on bivalent COVID per his neurologist ?Flu vaccine in the fall ?

## 2021-07-07 NOTE — Assessment & Plan Note (Signed)
Okay on the nexium daily ?

## 2021-07-07 NOTE — Assessment & Plan Note (Signed)
Doing okay with aubagio ?

## 2021-07-07 NOTE — Assessment & Plan Note (Signed)
And neuropathy ?Will recheck labs ?Uses the hydrocodone and cyclobenzaprine prn ?

## 2021-07-08 ENCOUNTER — Other Ambulatory Visit (INDEPENDENT_AMBULATORY_CARE_PROVIDER_SITE_OTHER): Payer: BC Managed Care – PPO

## 2021-07-08 DIAGNOSIS — Z1211 Encounter for screening for malignant neoplasm of colon: Secondary | ICD-10-CM | POA: Diagnosis not present

## 2021-07-09 ENCOUNTER — Telehealth: Payer: Self-pay | Admitting: Radiology

## 2021-07-09 ENCOUNTER — Encounter: Payer: Self-pay | Admitting: Internal Medicine

## 2021-07-09 DIAGNOSIS — R195 Other fecal abnormalities: Secondary | ICD-10-CM

## 2021-07-09 LAB — FECAL OCCULT BLOOD, IMMUNOCHEMICAL: Fecal Occult Bld: POSITIVE — AB

## 2021-07-09 NOTE — Telephone Encounter (Signed)
Left message on VM per DPR asking pt to let us know where he wanted to go. ?

## 2021-07-09 NOTE — Telephone Encounter (Signed)
[facility] called a critical result, POSITIVE ifob. Results given to Dr Silvio Pate ?

## 2021-07-13 LAB — PROTEIN ELECTROPHORESIS, SERUM, WITH REFLEX
Albumin ELP: 4.5 g/dL (ref 3.8–4.8)
Alpha 1: 0.3 g/dL (ref 0.2–0.3)
Alpha 2: 0.7 g/dL (ref 0.5–0.9)
Beta 2: 0.4 g/dL (ref 0.2–0.5)
Beta Globulin: 0.4 g/dL (ref 0.4–0.6)
Gamma Globulin: 1.1 g/dL (ref 0.8–1.7)
Total Protein: 7.3 g/dL (ref 6.1–8.1)

## 2021-07-14 ENCOUNTER — Encounter: Payer: Self-pay | Admitting: Gastroenterology

## 2021-07-15 ENCOUNTER — Encounter: Payer: Self-pay | Admitting: Internal Medicine

## 2021-07-16 ENCOUNTER — Encounter: Payer: Self-pay | Admitting: Neurology

## 2021-07-19 ENCOUNTER — Other Ambulatory Visit: Payer: Self-pay | Admitting: Neurology

## 2021-07-19 MED ORDER — LEFLUNOMIDE 20 MG PO TABS: 20.0000 mg | ORAL_TABLET | Freq: Every day | ORAL | 5 refills | Status: DC

## 2021-07-30 ENCOUNTER — Telehealth: Payer: Self-pay | Admitting: *Deleted

## 2021-07-30 ENCOUNTER — Ambulatory Visit: Payer: BC Managed Care – PPO | Admitting: *Deleted

## 2021-07-30 NOTE — Telephone Encounter (Signed)
Pt called and RS PV to Monday 4-24 at 4 pm- colon is still 5-11 Thursday at 230 pm  ?

## 2021-07-30 NOTE — Telephone Encounter (Signed)
Patient no showed PV today at 3 pm - Called patient and left message at 256 pm, 303 pm, 305 pm called husband's number and LM to have pt call me back and at 310 pm called pt and no answer - pt's number rings one time and goes straight to voice mail  ?LM  at 310 pm to return call by 5 pm today- If no call by 5 pm, PV and procedure will be canceled -  ? ? PV and Procedure both canceled- No Show letter mailed to patient   ?

## 2021-08-05 ENCOUNTER — Encounter: Payer: Self-pay | Admitting: Neurology

## 2021-08-09 ENCOUNTER — Ambulatory Visit (AMBULATORY_SURGERY_CENTER): Payer: BC Managed Care – PPO | Admitting: *Deleted

## 2021-08-09 VITALS — Ht 71.0 in | Wt 166.0 lb

## 2021-08-09 DIAGNOSIS — R195 Other fecal abnormalities: Secondary | ICD-10-CM

## 2021-08-09 NOTE — Progress Notes (Signed)
No egg or soy allergy known to patient  ?No issues known to pt with past sedation with any surgeries or procedures ?Patient denies ever being told they had issues or difficulty with intubation  ?No FH of Malignant Hyperthermia ?Pt is not on diet pills ?Pt is not on  home 02  ?Pt is not on blood thinners  ?Pt denies issues with constipation - uses Miralax as needed  ?No A fib or A flutter ? ?PV completed over the phone. Pt verified name, DOB, address and insurance during PV today.  ? ?Pt encouraged to call with questions or issues.  ?If pt has My chart, procedure instructions sent via My Chart  ?Insurance confirmed with pt at [facility] today  ? ?

## 2021-08-20 ENCOUNTER — Encounter: Payer: BC Managed Care – PPO | Admitting: Gastroenterology

## 2021-08-25 ENCOUNTER — Encounter: Payer: Self-pay | Admitting: Internal Medicine

## 2021-08-26 ENCOUNTER — Encounter: Payer: BC Managed Care – PPO | Admitting: Internal Medicine

## 2021-08-26 ENCOUNTER — Encounter: Payer: Self-pay | Admitting: Internal Medicine

## 2021-09-02 ENCOUNTER — Ambulatory Visit (AMBULATORY_SURGERY_CENTER): Payer: BC Managed Care – PPO | Admitting: Internal Medicine

## 2021-09-02 ENCOUNTER — Encounter: Payer: Self-pay | Admitting: Internal Medicine

## 2021-09-02 VITALS — BP 127/80 | HR 65 | Temp 97.7°F | Resp 16 | Ht 70.0 in | Wt 166.0 lb

## 2021-09-02 DIAGNOSIS — D123 Benign neoplasm of transverse colon: Secondary | ICD-10-CM

## 2021-09-02 DIAGNOSIS — K5730 Diverticulosis of large intestine without perforation or abscess without bleeding: Secondary | ICD-10-CM

## 2021-09-02 DIAGNOSIS — D125 Benign neoplasm of sigmoid colon: Secondary | ICD-10-CM

## 2021-09-02 DIAGNOSIS — K648 Other hemorrhoids: Secondary | ICD-10-CM | POA: Diagnosis not present

## 2021-09-02 DIAGNOSIS — R195 Other fecal abnormalities: Secondary | ICD-10-CM | POA: Diagnosis present

## 2021-09-02 DIAGNOSIS — Z86010 Personal history of colonic polyps: Secondary | ICD-10-CM | POA: Insufficient documentation

## 2021-09-02 DIAGNOSIS — Z860101 Personal history of adenomatous and serrated colon polyps: Secondary | ICD-10-CM

## 2021-09-02 HISTORY — DX: Personal history of colonic polyps: Z86.010

## 2021-09-02 HISTORY — DX: Personal history of adenomatous and serrated colon polyps: Z86.0101

## 2021-09-02 MED ORDER — SODIUM CHLORIDE 0.9 % IV SOLN: 500.0000 mL | INTRAVENOUS | Status: DC

## 2021-09-02 NOTE — Patient Instructions (Addendum)
You do have hemorrhoids and they were likely source of the blood in the stool. There were two tiny polyps seen and removed as well. They look benign. Some rarae diverticulosis also.  I will let you know pathology results and when to have another routine colonoscopy by mail and/or My Chart.   I appreciate the opportunity to care for you. Gatha Mayer, MD, FACG  YOU HAD AN ENDOSCOPIC PROCEDURE TODAY AT [facility]:   Refer to the procedure report that was given to you for any specific questions about what was found during the examination.  If the procedure report does not answer your questions, please call your gastroenterologist to clarify.  If you requested that your care partner not be given the details of your procedure findings, then the procedure report has been included in a sealed envelope for you to review at your convenience later.  YOU SHOULD EXPECT: Some feelings of bloating in the abdomen. Passage of more gas than usual.  Walking can help get rid of the air that was put into your GI tract during the procedure and reduce the bloating. If you had a lower endoscopy (such as a colonoscopy or flexible sigmoidoscopy) you may notice spotting of blood in your stool or on the toilet paper. If you underwent a bowel prep for your procedure, you may not have a normal bowel movement for a few days.  Please Note:  You might notice some irritation and congestion in your nose or some drainage.  This is from the oxygen used during your procedure.  There is no need for concern and it should clear up in a day or so.  SYMPTOMS TO REPORT IMMEDIATELY:  Following lower endoscopy (colonoscopy or flexible sigmoidoscopy):  Excessive amounts of blood in the stool  Significant tenderness or worsening of abdominal pains  Swelling of the abdomen that is new, acute  Fever of 100F or higher  For urgent or emergent issues, a gastroenterologist can be reached at any hour by calling [phone]  [phone]. Do not use MyChart messaging for urgent concerns.    DIET:  We do recommend a small meal at first, but then you may proceed to your regular diet.  Drink plenty of fluids but you should avoid alcoholic beverages for 24 hours.  ACTIVITY:  You should plan to take it easy for the rest of today and you should NOT DRIVE or use heavy machinery until tomorrow (because of the sedation medicines used during the test).    FOLLOW UP: Our staff will call the number listed on your records 48-72 hours following your procedure to check on you and address any questions or concerns that you may have regarding the information given to you following your procedure. If we do not reach you, we will leave a message.  We will attempt to reach you two times.  During this call, we will ask if you have developed any symptoms of COVID 19. If you develop any symptoms (ie: fever, flu-like symptoms, shortness of breath, cough etc.) before then, please call [phone].  If you test positive for Covid 19 in the 2 weeks post procedure, please call and report this information to [place].    If any biopsies were taken you will be contacted by phone or by letter within the next 1-3 weeks.  Please call us at [phone] if you have not heard about the biopsies in 3 weeks.    SIGNATURES/CONFIDENTIALITY: You and/or your care partner have signed paperwork  which will be entered into your electronic medical record.  These signatures attest to the fact that that the information above on your After Visit Summary has been reviewed and is understood.  Full responsibility of the confidentiality of this discharge information lies with you and/or your care-partner.

## 2021-09-02 NOTE — Op Note (Signed)
[facility] Patient Name: Cameron [name] Procedure Date: 09/02/2021 11:01 AM MRN: [id number] Endoscopist: Gatha Mayer , MD Age: 66 Referring MD:  Date of Birth: [birth year] Gender: Male Account #: [id number] Procedure:                Colonoscopy Indications:              Positive fecal immunochemical test Medicines:                Monitored Anesthesia Care Procedure:                Pre-Anesthesia Assessment:                           - Prior to the procedure, a History and Physical                            was performed, and patient medications and                            allergies were reviewed. The patient's tolerance of                            previous anesthesia was also reviewed. The risks                            and benefits of the procedure and the sedation                            options and risks were discussed with the patient.                            All questions were answered, and informed consent                            was obtained. Prior Anticoagulants: The patient has                            taken no previous anticoagulant or antiplatelet                            agents. ASA Grade Assessment: II - A patient with                            mild systemic disease. After reviewing the risks                            and benefits, the patient was deemed in                            satisfactory condition to undergo the procedure.                           After obtaining informed consent, the colonoscope  was passed under direct vision. Throughout the                            procedure, the patient's blood pressure, pulse, and                            oxygen saturations were monitored continuously. The                            Colonoscope was introduced through the anus and                            advanced to the the cecum, identified by                            appendiceal orifice and ileocecal  valve. The                            colonoscopy was performed without difficulty. The                            patient tolerated the procedure well. The quality                            of the bowel preparation was good. The ileocecal                            valve, appendiceal orifice, and rectum were                            photographed. The bowel preparation used was                            Miralax via split dose instruction. Scope In: 11:17:20 AM Scope Out: 11:41:26 AM Scope Withdrawal Time: 0 hours 19 minutes 46 seconds  Total Procedure Duration: 0 hours 24 minutes 6 seconds  Findings:                 The perianal and digital rectal examinations were                            normal. Pertinent negatives include normal prostate                            (size, shape, and consistency).                           Two sessile polyps were found in the sigmoid colon                            and transverse colon. The polyps were diminutive in                            size. These polyps were removed with a cold snare.  Resection and retrieval were complete. Verification                            of patient identification for the specimen was                            done. Estimated blood loss was minimal.                           A few diverticula were found in the sigmoid colon,                            descending colon and transverse colon.                           External and internal hemorrhoids were found.                           The exam was otherwise without abnormality on                            direct and retroflexion views. Complications:            No immediate complications. Estimated Blood Loss:     Estimated blood loss was minimal. Impression:               - Two diminutive polyps in the sigmoid colon and in                            the transverse colon, removed with a cold snare.                            Resected  and retrieved.                           - Diverticulosis in the sigmoid colon, in the                            descending colon and in the transverse colon.                           - External and internal hemorrhoids.                           - The examination was otherwise normal on direct                            and retroflexion views. Recommendation:           - Patient has a contact number available for                            emergencies. The signs and symptoms of potential                            delayed complications were  discussed with the                            patient. Return to normal activities tomorrow.                            Written discharge instructions were provided to the                            patient.                           - Resume previous diet.                           - Continue present medications.                           - Repeat colonoscopy is recommended. The                            colonoscopy date will be determined after pathology                            results from today's exam become available for                            review. Gatha Mayer, MD 09/02/2021 11:49:48 AM This report has been signed electronically.

## 2021-09-02 NOTE — Progress Notes (Signed)
Called to room to assist during endoscopic procedure.  Patient ID and intended procedure confirmed with present staff. Received instructions for my participation in the procedure from the performing physician.  

## 2021-09-02 NOTE — Progress Notes (Signed)
To pacu, VSS. Report to [email or web address] 

## 2021-09-02 NOTE — Progress Notes (Signed)
[facility] History and Physical   Primary Care Physician:  Venia Carbon, MD   Reason for Procedure:   + FIT stool   Plan:    colonoscopy     HPI: Cameron [name] is a 66 y.o. male w/ + FIT stool test.   Past Medical History:  Diagnosis Date   Abnormal MRI 2007   nonspecific white matter abnormalities   Allergy    seasonal   Anxiety    no meds   Arthritis    spine   GERD (gastroesophageal reflux disease)    History of kidney stones    x 2 episodes- last 2020   Hyperlipidemia    no meds-? 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 Authorizing Provider  esomeprazole (NEXIUM) 20 MG capsule Take 20 mg by mouth as directed. Takes every other day   Yes [provider]  fluticasone (FLONASE) 50 MCG/ACT nasal spray Place 2 sprays into both nostrils daily. In each nostril 11/18/15  Yes Venia Carbon, MD  gabapentin (NEURONTIN) 100 MG capsule TAKE 1-2 CAPSULES (100-200 MG TOTAL) BY MOUTH AT BEDTIME. Patient taking differently: Take 100-200 mg by mouth at bedtime. 1 mid afternoon and 2 at bedtime 05/19/21  Yes Sater, Nanine Means, MD  HYDROcodone-acetaminophen (NORCO) 7.5-325 MG tablet Take 1 tablet by mouth every 6 (six) hours as needed. 06/14/21  Yes [provider]  hydrocortisone (ANUSOL-HC) 25 MG suppository UNWRAP AND INSERT 1 SUPPOSITORY INTO THE RECTUM 3 TIMES DAILY AS NEEDED Patient  taking differently: UNWRAP AND INSERT 1 SUPPOSITORY INTO THE RECTUM 3 TIMES DAILY AS NEEDED for hems 07/07/21  Yes Viviana Simpler I, MD  ibuprofen (ADVIL,MOTRIN) 200 MG tablet Take 200-400 mg by mouth every 6 (six) hours as needed.   Yes [provider]  leflunomide (ARAVA) 20 MG tablet Take 1 tablet (20 mg total) by mouth daily. 07/19/21  Yes Sater, Nanine Means, MD  modafinil (PROVIGIL) 200 MG tablet One pill po qAM and one po qnoon 07/05/21  Yes Sater, Nanine Means, MD  ALPRAZolam Duanne Moron) 0.5 MG tablet Take 1-2 tablets by mouth 30 min prior to test. Can take additional tablet at time of test if needed. Can cause drowsiness, must have driver to and from test. Patient not taking: Reported on 08/09/2021 02/04/21   Sater, Nanine Means, MD  cyclobenzaprine (FLEXERIL) 10 MG tablet TAKE 1/2 -1 TABLETS (5-10 MG TOTAL) BY MOUTH AT BEDTIME. 09/15/20   Copland, Frederico Hamman, MD    Current Outpatient Medications  Medication Sig Dispense Refill   esomeprazole (NEXIUM) 20 MG capsule Take 20 mg by mouth as directed. Takes every other day     fluticasone (FLONASE) 50 MCG/ACT nasal spray Place 2 sprays into both nostrils daily. In each nostril 16 g 12   gabapentin (NEURONTIN) 100 MG capsule TAKE 1-2 CAPSULES (100-200 MG TOTAL) BY MOUTH AT BEDTIME. (Patient taking differently: Take 100-200 mg by mouth at bedtime.  1 mid afternoon and 2 at bedtime) 180 capsule 2   HYDROcodone-acetaminophen (NORCO) 7.5-325 MG tablet Take 1 tablet by mouth every 6 (six) hours as needed.     hydrocortisone (ANUSOL-HC) 25 MG suppository UNWRAP AND INSERT 1 SUPPOSITORY INTO THE RECTUM 3 TIMES DAILY AS NEEDED (Patient taking differently: UNWRAP AND INSERT 1 SUPPOSITORY INTO THE RECTUM 3 TIMES DAILY AS NEEDED for hems) 30 suppository 1   ibuprofen (ADVIL,MOTRIN) 200 MG tablet Take 200-400 mg by mouth every 6 (six) hours as needed.     leflunomide (ARAVA) 20 MG tablet Take 1 tablet (20 mg total) by mouth daily. 30 tablet 5   modafinil (PROVIGIL) 200  MG tablet One pill po qAM and one po qnoon 60 tablet 5   ALPRAZolam (XANAX) 0.5 MG tablet Take 1-2 tablets by mouth 30 min prior to test. Can take additional tablet at time of test if needed. Can cause drowsiness, must have driver to and from test. (Patient not taking: Reported on 08/09/2021) 3 tablet 0   cyclobenzaprine (FLEXERIL) 10 MG tablet TAKE 1/2 -1 TABLETS (5-10 MG TOTAL) BY MOUTH AT BEDTIME. 30 tablet 2   Current Facility-Administered Medications  Medication Dose Route Frequency Provider Last Rate Last Admin   0.9 %  sodium chloride infusion  500 mL Intravenous Continuous Gatha Mayer, MD        Allergies as of 09/02/2021 - Review Complete 09/02/2021  Allergen Reaction Noted   Citalopram hydrobromide  04/28/2006   Penicillins Other (See Comments) 04/28/2006    Family History  Problem Relation Age of Onset   Diabetes Father    Heart disease Father    Dementia Father    Lumbar disc disease Brother    Dementia Paternal Grandmother    CAD Neg Hx    Hypertension Neg Hx    Cancer Neg Hx    Colon cancer Neg Hx    Colon polyps Neg Hx    Esophageal cancer Neg Hx    Stomach cancer Neg Hx    Rectal cancer Neg Hx     Social History   Socioeconomic History   Marital status: Married    Spouse name: Not on file   Number of children: Not on file   Years of education: Not on file   Highest education level: Not on file  Occupational History   Occupation: Designer, jewellery in Scientist, research (life sciences) Industrial/product designer: [employer]  Tobacco Use   Smoking status: Never    Passive exposure: Past   Smokeless tobacco: Never  Vaping Use   Vaping Use: Never used  Substance and Sexual Activity   Alcohol use: Yes    Alcohol/week: 0.0 standard drinks    Comment: rare- once a month   Drug use: No   Sexual activity: Not on file  Other Topics Concern   Not on file  Social History Narrative   Lives   Caffeine use:    Long standing relationship with partner--finally able to marry    Social Determinants of Health   Financial Resource Strain: Not on file  Food Insecurity: Not on file  Transportation Needs: Not on file  Physical Activity: Not on file  Stress: Not on file  Social Connections: Not on file  Intimate Partner Violence: Not on file    Review of Systems:  All other review of systems negative except as mentioned in the HPI.  Physical Exam: Vital signs BP 132/87   Pulse 72   Temp 97.7 F (36.5 C)  Resp 10   Ht '5\' 10"'$  (1.778 m)   Wt 166 lb (75.3 kg)   SpO2 100%   BMI 23.82 kg/m   General:   Alert,  Well-developed, well-nourished, pleasant and cooperative in NAD Lungs:  Clear throughout to auscultation.   Heart:  Regular rate and rhythm; no murmurs, clicks, rubs,  or gallops. Abdomen:  Soft, nontender and nondistended. Normal bowel sounds.   Neuro/Psych:  Alert and cooperative. Normal mood and affect. A and O x 3   '@Sholonda Jobst'$  Simonne Maffucci, MD, [facility] [phone] (pager) 09/02/2021 11:07 AM@

## 2021-09-03 ENCOUNTER — Telehealth: Payer: Self-pay

## 2021-09-03 NOTE — Telephone Encounter (Signed)
Left message on follow up call. 

## 2021-09-03 NOTE — Telephone Encounter (Signed)
  Follow up Call-     09/02/2021   10:10 AM  Call back number  Post procedure Call Back phone  # [phone]  Permission to leave phone message Yes     Patient questions:  Do you have a fever, pain , or abdominal swelling? No. Pain Score  0 *  Have you tolerated food without any problems? Yes.    Have you been able to return to your normal activities? Yes.    Do you have any questions about your discharge instructions: Diet   No. Medications  No. Follow up visit  No.  Do you have questions or concerns about your Care? No.  Actions: * If pain score is 4 or above: No action needed, pain <4.

## 2021-09-10 ENCOUNTER — Encounter: Payer: Self-pay | Admitting: Neurology

## 2021-09-10 ENCOUNTER — Encounter: Payer: Self-pay | Admitting: Internal Medicine

## 2021-09-10 ENCOUNTER — Other Ambulatory Visit: Payer: Self-pay | Admitting: *Deleted

## 2021-09-10 DIAGNOSIS — G35 Multiple sclerosis: Secondary | ICD-10-CM

## 2021-09-10 MED ORDER — LEFLUNOMIDE 20 MG PO TABS: 20.0000 mg | ORAL_TABLET | Freq: Every day | ORAL | 5 refills | Status: DC

## 2021-09-10 MED ORDER — MODAFINIL 200 MG PO TABS: ORAL_TABLET | ORAL | 5 refills | Status: DC

## 2021-09-10 MED ORDER — GABAPENTIN 100 MG PO CAPS: ORAL_CAPSULE | ORAL | 5 refills | Status: DC

## 2021-09-10 NOTE — Addendum Note (Signed)
Addended by: Wyvonnia Lora on: 09/10/2021 11:31 AM   Modules accepted: Orders

## 2021-09-10 NOTE — Progress Notes (Signed)
I called [facility]. Cx any remaining refills on file for modafinil. Sent request to MD to e-scribe modafinil to Cameron [name] for pt as requested.

## 2021-09-14 ENCOUNTER — Encounter: Payer: Self-pay | Admitting: Internal Medicine

## 2021-09-14 ENCOUNTER — Encounter: Payer: Self-pay | Admitting: Neurology

## 2021-09-14 ENCOUNTER — Other Ambulatory Visit: Payer: Self-pay | Admitting: *Deleted

## 2021-09-14 ENCOUNTER — Other Ambulatory Visit: Payer: Self-pay | Admitting: Neurology

## 2021-09-14 DIAGNOSIS — Z86010 Personal history of colonic polyps: Secondary | ICD-10-CM

## 2021-09-14 MED ORDER — MODAFINIL 200 MG PO TABS: ORAL_TABLET | ORAL | 5 refills | Status: DC

## 2021-12-13 ENCOUNTER — Encounter: Payer: Self-pay | Admitting: Internal Medicine

## 2021-12-13 MED ORDER — LORAZEPAM 0.5 MG PO TABS: 0.2500 mg | ORAL_TABLET | Freq: Two times a day (BID) | ORAL | 0 refills | Status: DC | PRN

## 2021-12-21 ENCOUNTER — Encounter: Payer: Self-pay | Admitting: Neurology

## 2021-12-22 ENCOUNTER — Other Ambulatory Visit: Payer: Self-pay | Admitting: *Deleted

## 2021-12-22 ENCOUNTER — Telehealth: Payer: Self-pay | Admitting: Neurology

## 2021-12-22 DIAGNOSIS — R4189 Other symptoms and signs involving cognitive functions and awareness: Secondary | ICD-10-CM

## 2021-12-22 DIAGNOSIS — R2689 Other abnormalities of gait and mobility: Secondary | ICD-10-CM

## 2021-12-22 DIAGNOSIS — G35 Multiple sclerosis: Secondary | ICD-10-CM

## 2021-12-22 NOTE — Telephone Encounter (Signed)
Called the patient and asked about the lorazepam on file that he gets from another provider. The patient states that he forgot he had that and he would be fine with using that for his MRI procedure.

## 2021-12-22 NOTE — Telephone Encounter (Addendum)
Pt is requesting medication prescribed for his MRI as he is claustrophobic.   MRI brain w/wo contrast & MRI cervical w/wo contrast scheduled at [facility] for 12/28/21 at 9:30 am.  [employer]: [id number] (12/22/21-01/20/22)

## 2021-12-28 ENCOUNTER — Ambulatory Visit (INDEPENDENT_AMBULATORY_CARE_PROVIDER_SITE_OTHER): Payer: BC Managed Care – PPO

## 2021-12-28 DIAGNOSIS — R2689 Other abnormalities of gait and mobility: Secondary | ICD-10-CM

## 2021-12-28 DIAGNOSIS — G35 Multiple sclerosis: Secondary | ICD-10-CM | POA: Diagnosis not present

## 2021-12-28 DIAGNOSIS — R4189 Other symptoms and signs involving cognitive functions and awareness: Secondary | ICD-10-CM | POA: Diagnosis not present

## 2021-12-28 MED ORDER — GADOBENATE DIMEGLUMINE 529 MG/ML IV SOLN
15.0000 mL | Freq: Once | INTRAVENOUS | Status: AC | PRN
  Administered 2021-12-28: 15 mL via INTRAVENOUS

## 2021-12-30 ENCOUNTER — Encounter: Payer: Self-pay | Admitting: Internal Medicine

## 2021-12-30 ENCOUNTER — Encounter: Payer: Self-pay | Admitting: Neurology

## 2022-01-10 ENCOUNTER — Encounter: Payer: Self-pay | Admitting: Neurology

## 2022-01-10 ENCOUNTER — Ambulatory Visit: Payer: BC Managed Care – PPO | Admitting: Neurology

## 2022-01-10 VITALS — BP 136/89 | HR 84 | Ht 70.5 in | Wt 170.0 lb

## 2022-01-10 DIAGNOSIS — H469 Unspecified optic neuritis: Secondary | ICD-10-CM

## 2022-01-10 DIAGNOSIS — Z79899 Other long term (current) drug therapy: Secondary | ICD-10-CM

## 2022-01-10 DIAGNOSIS — R110 Nausea: Secondary | ICD-10-CM | POA: Diagnosis not present

## 2022-01-10 DIAGNOSIS — G35 Multiple sclerosis: Secondary | ICD-10-CM | POA: Diagnosis not present

## 2022-01-10 DIAGNOSIS — F988 Other specified behavioral and emotional disorders with onset usually occurring in childhood and adolescence: Secondary | ICD-10-CM | POA: Diagnosis not present

## 2022-01-10 DIAGNOSIS — F482 Pseudobulbar affect: Secondary | ICD-10-CM | POA: Insufficient documentation

## 2022-01-10 MED ORDER — METHYLPHENIDATE HCL 10 MG PO TABS: 10.0000 mg | ORAL_TABLET | Freq: Two times a day (BID) | ORAL | 0 refills | Status: DC

## 2022-01-10 NOTE — Progress Notes (Signed)
[facility]  PATIENT: Cameron [name] DOB: 03-07-1956  REFERRING DOCTOR OR PCP: Viviana Simpler MD SOURCE: Patient, notes from primary care, imaging and laboratory reports, MRI images personally reviewed.  _________________________________   HISTORICAL  CHIEF COMPLAINT:  Chief Complaint  Patient presents with   Follow-up    Pt alone, rm 16, he is noticing that he is having some ? Dizziness and has become more consistent doesn't bother his driving but can affect balance. Late afternoon after dinner he is noticing nausea/bloating he describes this as "quezy" this is intermittent but has also become more consistent.  MRI was completed 12/28/21.    HISTORY OF PRESENT ILLNESS:  Cameron [name] is a 66 y.o. man with relapsing remitting MS diagnosed 09/2019 but likely present since 2007 when he had visual disturbances. Marland Kitchen   Update  01/10/2022: He denies exacerbations butnotes he has a queasiness with meals.  He has no vomiting.   This is now daily.  He has no abdominal pain.   This usually occurs after a meal and last 45-60 minutes, especially after his mid-day pr evening meals.  Does not have DM.    He also notes a feeling of  lightheadedness (no vertigo) and feels his head is not clear, especially during the mornings.  He always notes the right eye has an incomplete inferior field cut.  He notes mild word finding issues  We discussed the cervical MRI - could be compressing left C5 ad C6 and has a plaque to the left that could affect strength and posterolaterally to the left that could affect sensation likely symptomatic as has dig 3-5 numbness not 1 and 2).    MRI brain 12/28/2021 showed Multiple T2/FLAIR hyperintense foci in the hemispheres in a pattern consistent with chronic demyelinating plaque associated with multiple sclerosis.  None of the foci enhanced or appear to be acute.  Compared to the MRI from 02/09/2021, there were no new lesions.  MRI cervical spine 912/2023  showed  IMPRESSION: This MRI of the cervical spine with and without contrast shows the following: Two T2 hyperintense foci within the final cord, laterally to the left adjacent to C4 and posterolaterally to the left adjacent to C6-C7.  Both of these were present on the 10/15/2019 MRI and all consistent with chronic demyelinating plaque associated with multiple sclerosis. At C4-C5, there are degenerative changes causing moderately severe left foraminal narrowing with potential for left C5 nerve root compression.  No spinal stenosis. At C5-C6, there are degenerative changes causing mild spinal stenosis and moderately severe left foraminal narrowing with potential for left C6 nerve root compression. Degenerative changes are essentially unchanged compared to the 2021 MRI. Normal enhancement pattern.  His back pain is doing better since he had an injection in his left hip (may have been SI joint).    The right hip is mildly better.   MRI had shown a herniated disc at L2-L3 that could be compressing the left L3 nerve root.  He has been on Aubagio since early and he tolerates it fairly well.   LFTs were fine.  The fatigue is better with modafinil 100-200 mg po qAM and 200 mg around noon.  He is trying to be more active.  About 3 times a week he has more siginificant  He developed Covid-19 about 01/02/2021.   Around 01/12/21, he had the onset of visual changes in the left eye.   He saw ophthalmology earlier ths week and wa found to have right optic edem  and an exam c/w optic neuritis.  He does note mild color desaturation.    It is unclear if this was more related to the acute COVID infection or to his MS as he has not had other exacerbations.  Since he Covid infection he also notes a mild cognitive fog.   The actual Covid-19 symptoms were mild, like a common cold.   Currently, gait is doing well.  He has no falls but feels balance is slightly worse and he uses the bannister now.   He is exercising and also  does some PT .  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    No current visual issues.   He notes some urinary urgency but no incontinence.    For the most part, bladder is fine.    Hesitancy and constipation are better.  Fatigue has been improved on modafinil 200-100.  Often he has more fatigue later in the day.  He sleeps well most noghts.  Gabapentin has helped some of the nighttime dysesthesias.   Depression is better but he has some anxiety.    The word finding issues are better.  He notes headaches are better.   He has SI joint pain and gets injection.       MS History:      He began to experience some neurologic symptoms in 2007.  That year, he had the onset of difficulties with eye movements.  Specifically, he was having difficulty tracking smoothly.   He also had some stumbling.  Over 2 monhs, his symptoms improved.   In 2013, he fell due to a left foot drop and continues to have mild left leg weakness. Since then he also has noted that he feels disoriented, has trouble coming up with the right words and veers to the left intermittently, especially in bright fluorescent lights as in some stores.  He also has had left hand numbness and reduced coordination for the last year.   He was diagnosed 11/05/2019 based on his history of optic neuritis, relapses with left-sided symptoms and additional changes in brain MRI and the presence of 2 foci in the cervical spine  Imaging studies: MRIs of the brain from 10/11/2005 and 10/29/2012:  The MRI show multiple T2/FLAIR hyperintense foci, at least 20, with some in the periventricular white matter and some in the juxtacortical white matter.  There was U-fiber involvement of a couple foci in the posterior left frontal lobe.  Between 2007 and 2014,  2-3 more lesions developed (the new lesions were mostly deep white matter).  We do not have any images of the cervical or thoracic spine spine so the spinal cord could not be reviewed.  MRI of the brain 10/15/2019 showed scattered T2/FLAIR hyperintense foci, some in the periventricular white matter radially oriented to the ventricles and a couple in the juxtacortical white matter..  Compared to the MRI from 2014, there were 3 or 4 new lesions.  None of the foci enhance.  MRI brain 04/29/2020 scattered T2/FLAIR hyperintense foci in the hemispheres in a pattern consistent with chronic demyelinating plaque associated with multiple sclerosis.  None of the foci appear to be acute.  They do not enhance.  Compared to the MRI dated  04/29/2020, there are no new lesions.  MRI of the cervical and thoracic spine 10/15/2019 showed 2 T2 hyperintense foci within the spinal cord, 1 laterally to the left at C4 and the other posteriorly to the left adjacent to C6-C7.  There are degenerative changes at C4-C5 and C5-C6 with potential for left C5 nerve root compression.  There is mild spinal stenosis.  The thoracic spine showed a normal spinal cord and minimal degenerative changes at a couple levels that did not lead to nerve root compression or spinal stenosis.  MRI of the lumbar spine shows a large herniated disc at L2-L3 towards the left compressing the left L3 nerve root.  Additionally there is multilevel degenerative change elsewhere but with less potential for nerve root compression.   MRI brain 12/28/2021 showed Multiple T2/FLAIR hyperintense foci in the hemispheres in a pattern consistent with chronic demyelinating plaque associated with multiple sclerosis.  None of the foci enhanced or appear to be acute.  Compared to the MRI from 02/09/2021, there were no new lesions.  MRI cervical spine 912/2023 showed  IMPRESSION: This MRI of the cervical spine with and without contrast shows the following: Two T2 hyperintense foci  within the final cord, laterally to the left adjacent to C4 and posterolaterally to the left adjacent to C6-C7.  Both of these were present on the 10/15/2019 MRI and all consistent with chronic demyelinating plaque associated with multiple sclerosis. At C4-C5, there are degenerative changes causing moderately severe left foraminal narrowing with potential for left C5 nerve root compression.  No spinal stenosis. At C5-C6, there are degenerative changes causing mild spinal stenosis and moderately severe left foraminal narrowing with potential for left C6 nerve root compression. Degenerative changes are essentially unchanged compared to the 2021 MRI. Normal enhancement pattern.  REVIEW OF SYSTEMS: Constitutional: No fevers, chills, sweats, or change in appetite Eyes: As above Ear, nose and throat: No hearing loss, ear pain, nasal congestion, sore throat Cardiovascular: No chest pain, palpitations Respiratory:  No shortness of breath at rest or with exertion.   No wheezes GastrointestinaI: No nausea, vomiting, diarrhea, abdominal pain, fecal incontinence Genitourinary:  No dysuria, urinary retention.  He has mild frequency and nocturia.  He has a history of kidney stones. Musculoskeletal:  No neck pain, back pain Integumentary: No rash, pruritus, skin lesions Neurological: as above Psychiatric: No depression at this time.  No anxiety Endocrine: No palpitations, diaphoresis, change in appetite, change in weigh or increased thirst Hematologic/Lymphatic:  No anemia, purpura, petechiae. Allergic/Immunologic: No itchy/runny eyes, nasal congestion, recent allergic reactions, rashes  ALLERGIES: Allergies  Allergen Reactions   Citalopram Hydrobromide     REACTION: unspecified   Penicillins Other (See Comments)    REACTION: questionable    HOME MEDICATIONS:  Current Outpatient Medications:    cyclobenzaprine (FLEXERIL) 10 MG tablet, TAKE 1/2 -1 TABLETS (5-10 MG TOTAL) BY MOUTH AT BEDTIME., Disp:  30 tablet, Rfl: 2   fluticasone (FLONASE) 50 MCG/ACT nasal spray, Place 2 sprays into both nostrils daily. In each nostril, Disp: 16 g, Rfl: 12   gabapentin (NEURONTIN) 100 MG capsule, Take 1 capsule (100 mg total) by mouth every morning AND 2 capsules (200 mg total) at bedtime., Disp: 90 capsule, Rfl: 5   HYDROcodone-acetaminophen (NORCO) 7.5-325 MG tablet, Take 1 tablet by mouth every 6 (six) hours as needed., Disp: , Rfl:    hydrocortisone (ANUSOL-HC) 25 MG suppository, UNWRAP AND INSERT 1 SUPPOSITORY INTO THE RECTUM 3 TIMES DAILY AS NEEDED (Patient taking differently: UNWRAP AND INSERT  1 SUPPOSITORY INTO THE RECTUM 3 TIMES DAILY AS NEEDED for hems), Disp: 30 suppository, Rfl: 1   ibuprofen (ADVIL,MOTRIN) 200 MG tablet, Take 200-400 mg by mouth every 6 (six) hours as needed., Disp: , Rfl:    leflunomide (ARAVA) 20 MG tablet, Take 1 tablet (20 mg total) by mouth daily., Disp: 30 tablet, Rfl: 5   LORazepam (ATIVAN) 0.5 MG tablet, Take 0.5-10 tablets (0.25-5 mg total) by mouth 2 (two) times daily as needed for anxiety., Disp: 30 tablet, Rfl: 0   methylphenidate (RITALIN) 10 MG tablet, Take 1 tablet (10 mg total) by mouth 2 (two) times daily. Take two po AM and one po at noon, Disp: 90 tablet, Rfl: 0  PAST MEDICAL HISTORY: Past Medical History:  Diagnosis Date   Abnormal MRI 2007   nonspecific white matter abnormalities   Allergy    seasonal   Anxiety    no meds   Arthritis    spine   GERD (gastroesophageal reflux disease)    History of kidney stones    x 2 episodes- last 2020   Hx of adenomatous colonic polyps 09/02/2021   2 diminutive adenomas recall 2030   Hyperlipidemia    no meds-? borderline   Migraines    OCULAR connected to MS   Neuromuscular disorder ([place])    MS    PAST SURGICAL HISTORY: Past Surgical History:  Procedure Laterality Date   COLONOSCOPY  06/27/2007   Normal   CYSTOSCOPY/URETEROSCOPY/HOLMIUM LASER/STENT PLACEMENT Right 07/27/2018   Procedure:  CYSTOSCOPY/URETEROSCOPY/HOLMIUM LASER/STENT PLACEMENT;  Surgeon: Abbie Sons, MD;  Location: [facility] ORS;  Service: Urology;  Laterality: Right;   LUMBAR DISC SURGERY     URETEROLITHOTOMY  11/16/2001   US ECHOCARDIOGRAPHY  11/16/2004   normal, Carotids normal    FAMILY HISTORY: Family History  Problem Relation Age of Onset   Diabetes Father    Heart disease Father    Dementia Father    Lumbar disc disease Brother    Dementia Paternal Grandmother    CAD Neg Hx    Hypertension Neg Hx    Cancer Neg Hx    Colon cancer Neg Hx    Colon polyps Neg Hx    Esophageal cancer Neg Hx    Stomach cancer Neg Hx    Rectal cancer Neg Hx     SOCIAL HISTORY:  Social History   Socioeconomic History   Marital status: Married    Spouse name: Not on file   Number of children: Not on file   Years of education: Not on file   Highest education level: Not on file  Occupational History   Occupation: Designer, jewellery in Scientist, research (life sciences) Industrial/product designer: [employer]  Tobacco Use   Smoking status: Never    Passive exposure: Past   Smokeless tobacco: Never  Vaping Use   Vaping Use: Never used  Substance and Sexual Activity   Alcohol use: Yes    Alcohol/week: 0.0 standard drinks of alcohol    Comment: rare- once a month   Drug use: No   Sexual activity: Not on file  Other Topics Concern   Not on file  Social History Narrative   Lives   Caffeine use:    Long standing relationship with partner--finally able to marry   Social Determinants of Health   Financial Resource Strain: Not on file  Food Insecurity: Not on file  Transportation Needs: Not on file  Physical Activity: Not on file  Stress: Not on file  Social  Connections: Not on file  Intimate Partner Violence: Not on file     PHYSICAL EXAM  Vitals:   01/10/22 0941  BP: 136/89  Pulse: 84  Weight: 170 lb (77.1 kg)  Height: 5' 10.5" (1.791 m)    Body mass index is 24.05 kg/m.   General: The patient is  well-developed and well-nourished and in no acute distress  Neck:  The neck is nontender.  Skin: Extremities are without rash or  edema.  Neurologic Exam  Mental status: The patient is alert and oriented x 3 at the time of the examination. The patient has apparent normal recent and remote memory, with an apparently normal attention span and concentration ability.   Speech is normal.  Cranial nerves: Extraocular movements are full. Facial strength is fine. No dysarthria is noted.No obvious hearing deficits are noted.  Motor:  Muscle bulk is normal.   Tone is normal. Strength is  5 / 5 in all 4 extremities except 4+/5 proximal left leg..   Sensory: Sensory testing is intact to soft touch and vibration sensation in all 4 extremities.  Coordination: Cerebellar testing reveals good finger-nose-finger and heel-to-shin bilaterally.  Gait and station: Station is normal.    The gait has a normal stride.  There is a very slight left foot drop.  His tandem gait is mildly wide.  Romberg is negative.  Reflexes: Deep tendon reflexes are symmetric and normal in arms but increased at knees, left > right (with spread) and at the ankles (though no clonus)  DIAGNOSTIC DATA (LABS, IMAGING, TESTING) - I reviewed patient records, labs, notes, testing and imaging myself where available.  Lab Results  Component Value Date   WBC 4.8 07/07/2021   HGB 14.6 07/07/2021   HCT 42.6 07/07/2021   MCV 91.8 07/07/2021   PLT 245.0 07/07/2021      Component Value Date/Time   NA 137 07/07/2021 0929   NA 137 12/28/2020 0937   K 4.2 07/07/2021 0929   CL 101 07/07/2021 0929   CO2 27 07/07/2021 0929   GLUCOSE 76 07/07/2021 0929   BUN 11 07/07/2021 0929   BUN 9 12/28/2020 0937   CREATININE 0.95 07/07/2021 0929   CREATININE 1.13 11/21/2016 1224   CALCIUM 9.6 07/07/2021 0929   PROT 7.3 07/07/2021 0929   PROT 7.4 07/07/2021 0929   PROT 7.4 12/28/2020 0937   ALBUMIN 4.7 07/07/2021 0929   ALBUMIN 4.7 07/07/2021  0929   ALBUMIN 4.6 12/28/2020 0937   AST 18 07/07/2021 0929   ALT 13 07/07/2021 0929   ALKPHOS 80 07/07/2021 0929   BILITOT 0.7 07/07/2021 0929   BILITOT 0.3 12/28/2020 0937   GFRNONAA 83 11/04/2019 1433   GFRAA 96 11/04/2019 1433   Lab Results  Component Value Date   CHOL 248 (H) 11/21/2016   HDL 43 11/21/2016   LDLCALC 155 (H) 11/21/2016   LDLDIRECT 136.0 10/21/2014   TRIG 249 (H) 11/21/2016   CHOLHDL 5.8 (H) 11/21/2016   No results found for: "HGBA1C" Lab Results  Component Value Date   VITAMINB12 239 07/07/2021   Lab Results  Component Value Date   TSH 5.45 (H) 10/16/2013       ASSESSMENT AND PLAN  Multiple sclerosis ([place]) - Plan: Comprehensive metabolic panel, CBC with Differential/Platelet  High risk medication use - Plan: Comprehensive metabolic panel, CBC with Differential/Platelet  ADD (attention deficit disorder) without hyperactivity  Nausea  Right optic neuritis  Pseudobulbar affect   1.  Continue Aubagio.Marland Kitchen  Check labs.  If  nausea persists see PCP or GI.  Also consider adding Reglan 2.   Change Provigil to a stimulant 3.   Continue seeing pain management for lumbar issues.   4.   Stay active and exercise as tolerated 5.   Increase modafinil for fatigue to 200 g twice a day.   6.   If pseudobulba affect symptoms worsen, consider Nuedexta.   Return to see me in 6 months or sooner if there are new or worsening neurologic symptoms.  42-minute office visit with the majority of the time spent face-to-face for history and physical, discussion/counseling and decision-making.  Additional time with record review and documentation.   Cereniti Curb A. Felecia Shelling, MD, [facility] 5/79/0383, 33:83 AM Certified in Neurology, Clinical Neurophysiology, Sleep Medicine and Neuroimaging  [facility] [address], [place], [place] [address] [phone]

## 2022-01-11 LAB — CBC WITH DIFFERENTIAL/PLATELET
Basophils Absolute: 0 10*3/uL (ref 0.0–0.2)
Basos: 1 %
EOS (ABSOLUTE): 0 10*3/uL (ref 0.0–0.4)
Eos: 1 %
Hematocrit: 42.7 % (ref 37.5–51.0)
Hemoglobin: 14.4 g/dL (ref 13.0–17.7)
Immature Grans (Abs): 0 10*3/uL (ref 0.0–0.1)
Immature Granulocytes: 0 %
Lymphocytes Absolute: 1.2 10*3/uL (ref 0.7–3.1)
Lymphs: 30 %
MCH: 30.5 pg (ref 26.6–33.0)
MCHC: 33.7 g/dL (ref 31.5–35.7)
MCV: 91 fL (ref 79–97)
Monocytes Absolute: 0.4 10*3/uL (ref 0.1–0.9)
Monocytes: 10 %
Neutrophils Absolute: 2.5 10*3/uL (ref 1.4–7.0)
Neutrophils: 58 %
Platelets: 254 10*3/uL (ref 150–450)
RBC: 4.72 x10E6/uL (ref 4.14–5.80)
RDW: 11.7 % (ref 11.6–15.4)
WBC: 4.2 10*3/uL (ref 3.4–10.8)

## 2022-01-11 LAB — COMPREHENSIVE METABOLIC PANEL
ALT: 12 IU/L (ref 0–44)
AST: 20 IU/L (ref 0–40)
Albumin/Globulin Ratio: 1.6 (ref 1.2–2.2)
Albumin: 4.3 g/dL (ref 3.9–4.9)
Alkaline Phosphatase: 97 IU/L (ref 44–121)
BUN/Creatinine Ratio: 10 (ref 10–24)
BUN: 8 mg/dL (ref 8–27)
Bilirubin Total: 0.3 mg/dL (ref 0.0–1.2)
CO2: 22 mmol/L (ref 20–29)
Calcium: 9.2 mg/dL (ref 8.6–10.2)
Chloride: 98 mmol/L (ref 96–106)
Creatinine, Ser: 0.84 mg/dL (ref 0.76–1.27)
Globulin, Total: 2.7 g/dL (ref 1.5–4.5)
Glucose: 92 mg/dL (ref 70–99)
Potassium: 5.2 mmol/L (ref 3.5–5.2)
Sodium: 135 mmol/L (ref 134–144)
Total Protein: 7 g/dL (ref 6.0–8.5)
eGFR: 97 mL/min/{1.73_m2} (ref >59)

## 2022-02-08 ENCOUNTER — Telehealth: Payer: Self-pay | Admitting: *Deleted

## 2022-02-08 NOTE — Telephone Encounter (Signed)
Submitted PA modaifnil on CMM. Key: [id number]. Waiting on determination from [place].

## 2022-02-09 NOTE — Telephone Encounter (Signed)
Received fax from [facility] that [employer] approved 02/08/22-02/09/23. PA# [id number] [phone] Non-grandfathered [phone]

## 2022-02-11 ENCOUNTER — Encounter: Payer: Self-pay | Admitting: Neurology

## 2022-02-14 ENCOUNTER — Other Ambulatory Visit: Payer: Self-pay | Admitting: Neurology

## 2022-02-14 ENCOUNTER — Encounter (INDEPENDENT_AMBULATORY_CARE_PROVIDER_SITE_OTHER): Payer: Self-pay

## 2022-02-14 MED ORDER — METHYLPHENIDATE HCL 10 MG PO TABS: ORAL_TABLET | ORAL | 0 refills | Status: DC

## 2022-02-14 NOTE — Telephone Encounter (Signed)
Pt is calling requesting a refill on medication methylphenidate (RITALIN) 10 MG tablet. Refill should be sent to [facility] [phone]. Pt stated he was told to experiment with medication and he is currently taking three a day. Pt said his medication needs to be adjusted. Pt stated he is out of mediation and needs that refill sent today.

## 2022-03-10 ENCOUNTER — Other Ambulatory Visit: Payer: Self-pay | Admitting: Neurology

## 2022-03-12 ENCOUNTER — Other Ambulatory Visit: Payer: Self-pay | Admitting: Neurology

## 2022-03-14 ENCOUNTER — Telehealth: Payer: Self-pay | Admitting: Neurology

## 2022-03-14 MED ORDER — METHYLPHENIDATE HCL 10 MG PO TABS: ORAL_TABLET | ORAL | 0 refills | Status: DC

## 2022-03-14 MED ORDER — GABAPENTIN 100 MG PO CAPS: ORAL_CAPSULE | ORAL | 5 refills | Status: DC

## 2022-03-14 NOTE — Telephone Encounter (Signed)
E-scribed gabapentin rx to pharmacy. Per drug registry, last refilled methylphenidate 02/14/22 #90. Last seen 01/10/22 and next f/u 07/12/22.

## 2022-03-14 NOTE — Telephone Encounter (Signed)
Pt is calling. Requesting a refill on medication methylphenidate (RITALIN) 10 MG tablet and gabapentin (NEURONTIN) 100 MG capsule. Refill should be sent to [place] [phone]

## 2022-03-30 ENCOUNTER — Other Ambulatory Visit: Payer: Self-pay | Admitting: *Deleted

## 2022-03-30 DIAGNOSIS — N201 Calculus of ureter: Secondary | ICD-10-CM

## 2022-03-31 ENCOUNTER — Ambulatory Visit: Payer: BC Managed Care – PPO | Admitting: Urology

## 2022-03-31 ENCOUNTER — Ambulatory Visit
Admission: RE | Admit: 2022-03-31 | Discharge: 2022-03-31 | Disposition: A | Discharge status: Home / Self Care | Payer: BC Managed Care – PPO | Source: Ambulatory Visit | Attending: Urology | Admitting: Urology

## 2022-03-31 ENCOUNTER — Encounter: Payer: Self-pay | Admitting: Urology

## 2022-03-31 VITALS — BP 144/88 | HR 79 | Ht 70.5 in | Wt 168.0 lb

## 2022-03-31 DIAGNOSIS — Z87442 Personal history of urinary calculi: Secondary | ICD-10-CM | POA: Diagnosis not present

## 2022-03-31 DIAGNOSIS — N201 Calculus of ureter: Secondary | ICD-10-CM | POA: Insufficient documentation

## 2022-03-31 NOTE — Progress Notes (Signed)
03/31/2022 1:33 PM   Cameron [name] August 05, 1955 [id number]  Referring provider: Venia Carbon, MD [address] [place],  [place] [address]  Chief Complaint  Patient presents with   Nephrolithiasis    Urologic history: 1.  History stone disease -Ureteroscopic removal 7 mm right distal calculus 07/2018 -1 previous stone 15 years prior   HPI: 65 y.o. male presents for a 2-year follow-up.  Denies recurrent stone symptoms No bothersome LUTS Denies dysuria, gross hematuria PSA has been checked annually by PCP and has been low and stable   PMH: Past Medical History:  Diagnosis Date   Abnormal MRI 2007   nonspecific white matter abnormalities   Allergy    seasonal   Anxiety    no meds   Arthritis    spine   GERD (gastroesophageal reflux disease)    History of kidney stones    x 2 episodes- last 2020   Hx of adenomatous colonic polyps 09/02/2021   2 diminutive adenomas recall 2030   Hyperlipidemia    no meds-? borderline   Migraines    OCULAR connected to MS   Neuromuscular disorder [facility])    MS    Surgical History: Past Surgical History:  Procedure Laterality Date   COLONOSCOPY  06/27/2007   Normal   CYSTOSCOPY/URETEROSCOPY/HOLMIUM LASER/STENT PLACEMENT Right 07/27/2018   Procedure: CYSTOSCOPY/URETEROSCOPY/HOLMIUM LASER/STENT PLACEMENT;  Surgeon: Abbie Sons, MD;  Location: [facility] ORS;  Service: Urology;  Laterality: Right;   LUMBAR DISC SURGERY     URETEROLITHOTOMY  11/16/2001   US ECHOCARDIOGRAPHY  11/16/2004   normal, Carotids normal    Home Medications:  Allergies as of 03/31/2022       Reactions   Citalopram Hydrobromide    REACTION: unspecified   Penicillins Other (See Comments)   REACTION: questionable        Medication List        Accurate as of March 31, 2022  1:33 PM. If you have any questions, ask your nurse or doctor.          STOP taking these medications    LORazepam 0.5 MG tablet Commonly known as:  ATIVAN Stopped by: Abbie Sons, MD       TAKE these medications    cyclobenzaprine 10 MG tablet Commonly known as: FLEXERIL TAKE 1/2 -1 TABLETS (5-10 MG TOTAL) BY MOUTH AT BEDTIME.   fluticasone 50 MCG/ACT nasal spray Commonly known as: FLONASE Place 2 sprays into both nostrils daily. In each nostril   gabapentin 100 MG capsule Commonly known as: NEURONTIN Take 1 capsule (100 mg total) by mouth every morning AND 2 capsules (200 mg total) at bedtime.   HYDROcodone-acetaminophen 7.5-325 MG tablet Commonly known as: NORCO Take 1 tablet by mouth every 6 (six) hours as needed.   hydrocortisone 25 MG suppository Commonly known as: ANUSOL-HC UNWRAP AND INSERT 1 SUPPOSITORY INTO THE RECTUM 3 TIMES DAILY AS NEEDED What changed: additional instructions   ibuprofen 200 MG tablet Commonly known as: ADVIL Take 200-400 mg by mouth every 6 (six) hours as needed.   leflunomide 20 MG tablet Commonly known as: Arava Take 1 tablet (20 mg total) by mouth daily.   methylphenidate 10 MG tablet Commonly known as: Ritalin Take two po AM and one po at noon        Allergies:  Allergies  Allergen Reactions   Citalopram Hydrobromide     REACTION: unspecified   Penicillins Other (See Comments)    REACTION: questionable  Family History: Family History  Problem Relation Age of Onset   Diabetes Father    Heart disease Father    Dementia Father    Lumbar disc disease Brother    Dementia Paternal Grandmother    CAD Neg Hx    Hypertension Neg Hx    Cancer Neg Hx    Colon cancer Neg Hx    Colon polyps Neg Hx    Esophageal cancer Neg Hx    Stomach cancer Neg Hx    Rectal cancer Neg Hx     Social History:  reports that he has never smoked. He has been exposed to tobacco smoke. He has never used smokeless tobacco. He reports current alcohol use. He reports that he does not use drugs.   Physical Exam: BP (!) 144/88   Pulse 79   Ht 5' 10.5" (1.791 m)   Wt 168 lb (76.2 kg)    BMI 23.76 kg/m   Constitutional:  Alert, No acute distress. HEENT: [place] AT Respiratory: Normal respiratory effort, no increased work of breathing. GI: Abdomen is soft, nontender, nondistended, no abdominal masses Neurologic: Grossly intact, no focal deficits, moving all 4 extremities. Psychiatric: Normal mood and affect.  Pertinent imaging: KUB performed today was personally reviewed and interpreted.  Pelvic phleboliths present.  No calcifications suspicious for recurrent urinary tract stones are identified  Assessment & Plan:    1.  History nephrolithiasis No evidence recurrent stone disease since last visit 2 years ago Follow-up prn     Abbie Sons, MD  [facility] [address], [place], [place] [address] [phone]

## 2022-04-01 ENCOUNTER — Telehealth: Payer: Self-pay | Admitting: Internal Medicine

## 2022-04-01 MED ORDER — HYDROCORTISONE ACETATE 25 MG RE SUPP: RECTAL | 1 refills | Status: DC

## 2022-04-01 NOTE — Telephone Encounter (Signed)
  Encourage patient to contact the pharmacy for refills or they can request refills through [facility]  Did the patient contact the pharmacy:  N  **Please note: Patient states medicine is not covered by his insurance. He usually gets 30 suppositories, patient is requesting only 15 suppositories be sent to [facility]  LAST APPOINTMENT DATE:  3.22.23 NEXT APPOINTMENT DATE:  3.25.24  MEDICATION: hydrocortisone (ANUSOL-HC) 25 MG suppository   Is the patient out of medication? Y   If not, how much is left? (Does not get medication often)  Is this a 90 day supply: N  [id number] [place], [place] Phone: [phone]  Fax: [phone]      Let patient know to contact pharmacy at the end of the day to make sure medication is ready.  Please notify patient to allow 48-72 hours to process

## 2022-04-01 NOTE — Addendum Note (Signed)
Addended by: Viviana Simpler I on: 04/01/2022 07:56 PM   Modules accepted: Orders

## 2022-04-07 ENCOUNTER — Other Ambulatory Visit: Payer: Self-pay | Admitting: *Deleted

## 2022-04-07 ENCOUNTER — Encounter: Payer: Self-pay | Admitting: Neurology

## 2022-04-07 MED ORDER — METHYLPHENIDATE HCL 10 MG PO TABS: ORAL_TABLET | ORAL | 0 refills | Status: DC

## 2022-04-29 IMAGING — CR DG ABDOMEN 1V
1 series · 2 of 2 positions shown · non-contrast
Comparison: March 07, 2019.

CLINICAL DATA: Kidney stone.

EXAM:
ABDOMEN - 1 VIEW

[Series 1: dg abd 1 view · 0.14mm/px · 2 of 2 slices shown]
[im 1/2]
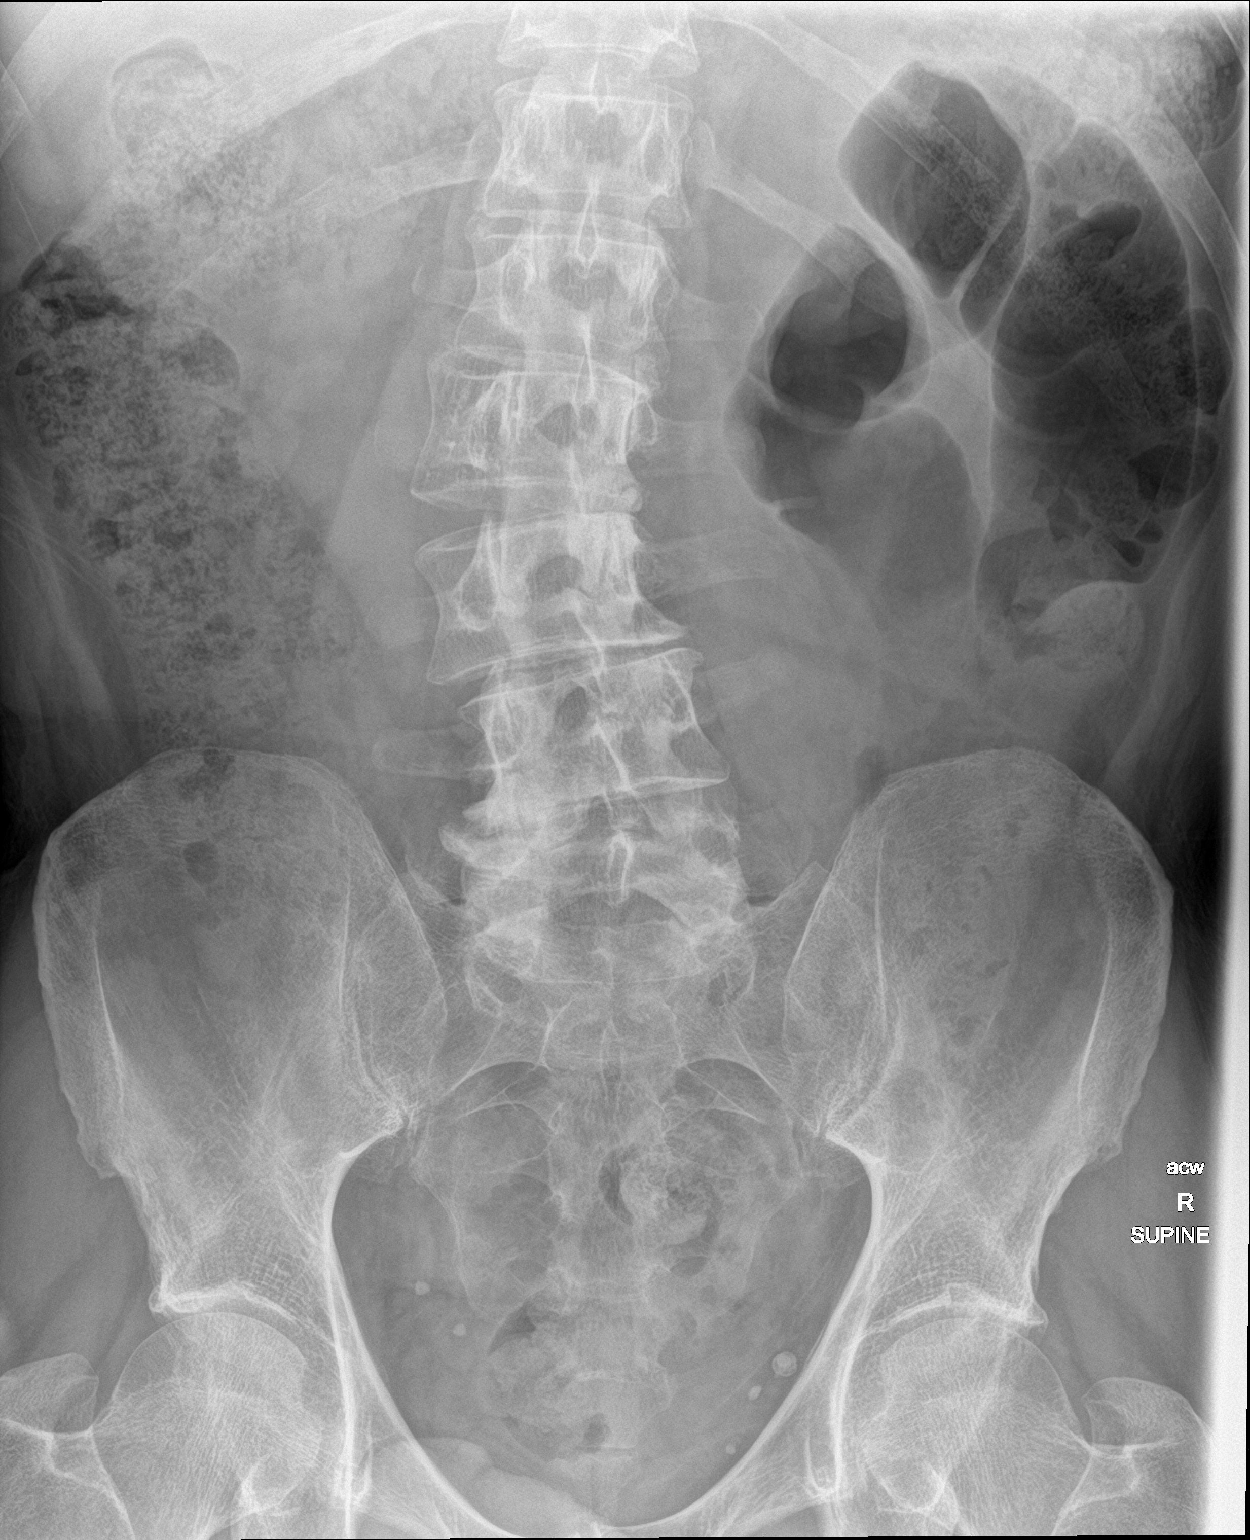
[im 2/2]
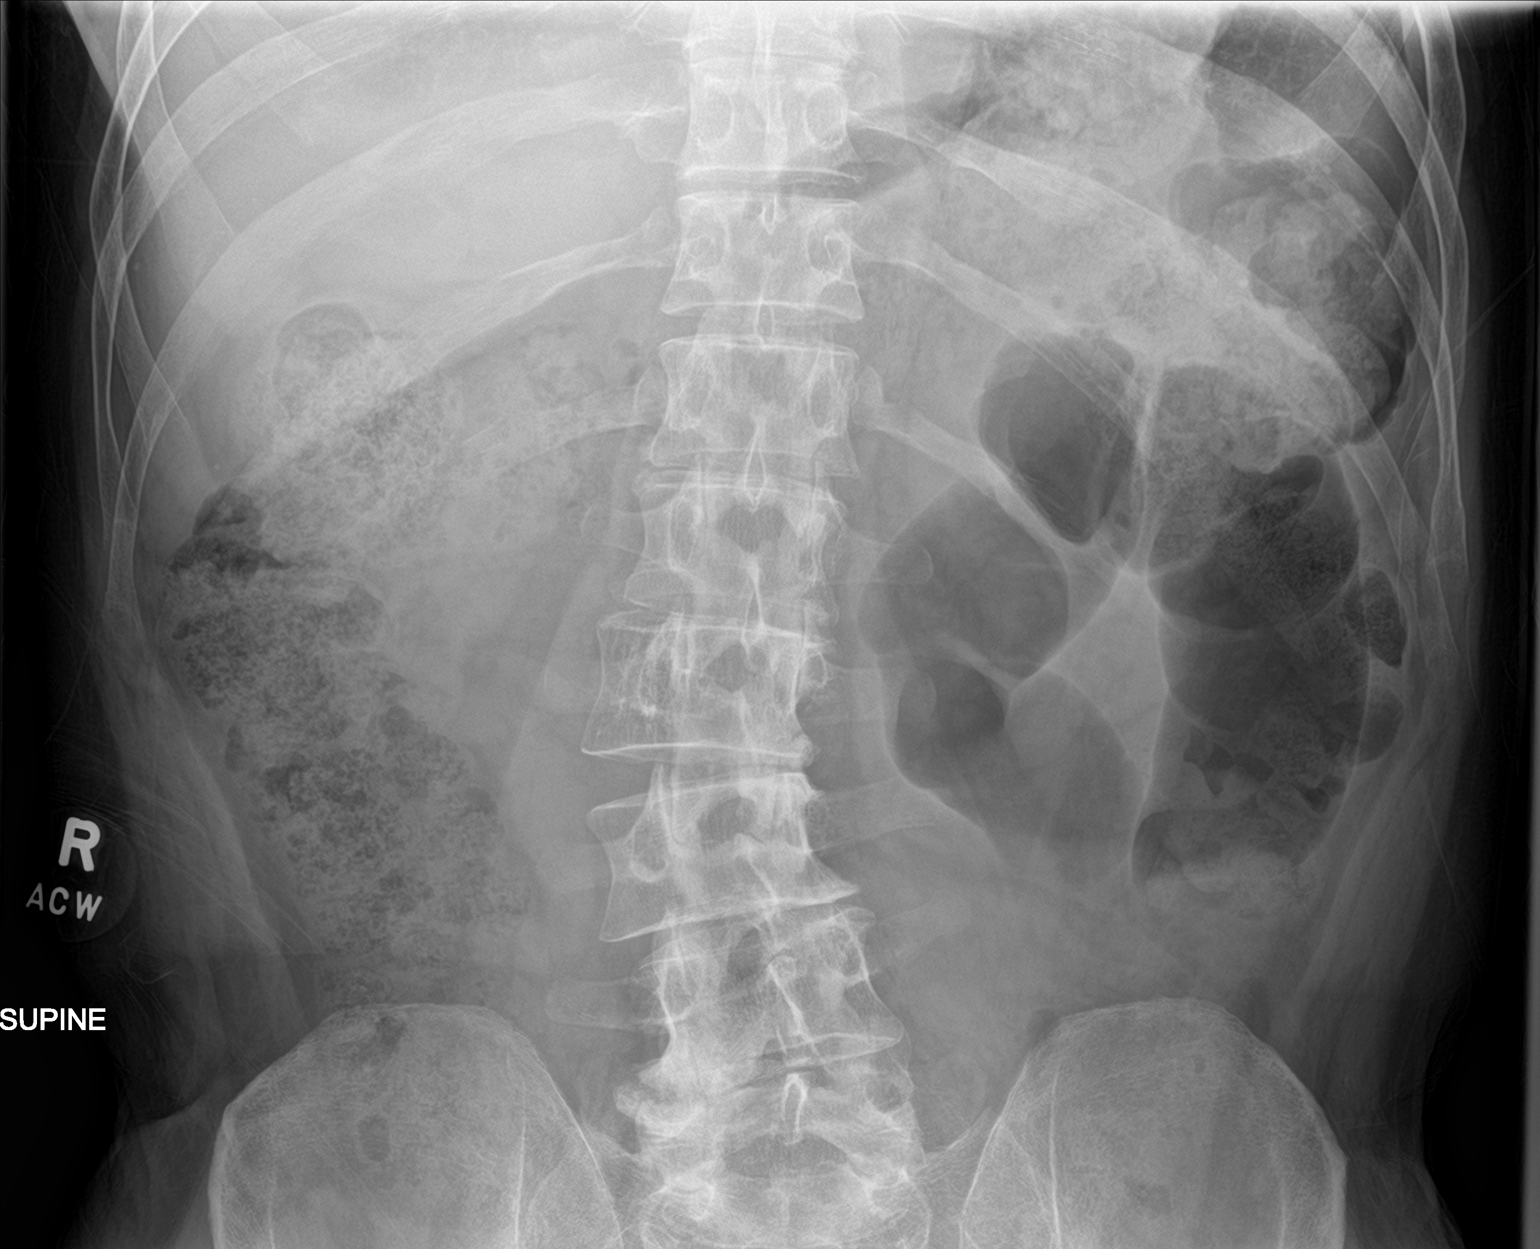

[2 of 2 positions shown; findings below may reference images not displayed]

FINDINGS: The bowel gas pattern is normal. No nephrolithiasis is noted.
Phleboliths are noted in the pelvis.
IMPRESSION: Negative.

## 2022-05-17 ENCOUNTER — Other Ambulatory Visit: Payer: Self-pay | Admitting: Neurology

## 2022-05-17 MED ORDER — METHYLPHENIDATE HCL 10 MG PO TABS: ORAL_TABLET | ORAL | 0 refills | Status: DC

## 2022-05-17 NOTE — Telephone Encounter (Signed)
Last seen 01/10/22 and next f/u 07/12/22. Per drug registry, last refilled 04/17/22 #90.

## 2022-05-17 NOTE — Telephone Encounter (Signed)
Pt is calling. Requesting a refill on methylphenidate (RITALIN) 10 MG tablet. Refill should be sent to [facility] [id number]

## 2022-06-12 ENCOUNTER — Encounter: Payer: Self-pay | Admitting: Neurology

## 2022-06-13 ENCOUNTER — Encounter: Payer: Self-pay | Admitting: Internal Medicine

## 2022-06-14 ENCOUNTER — Encounter: Payer: Self-pay | Admitting: Family

## 2022-06-14 ENCOUNTER — Telehealth: Payer: BC Managed Care – PPO | Admitting: Family

## 2022-06-14 ENCOUNTER — Other Ambulatory Visit: Payer: Self-pay | Admitting: Neurology

## 2022-06-14 VITALS — BP 135/82 | Ht 70.5 in | Wt 171.0 lb

## 2022-06-14 DIAGNOSIS — M7989 Other specified soft tissue disorders: Secondary | ICD-10-CM | POA: Insufficient documentation

## 2022-06-14 DIAGNOSIS — U071 COVID-19: Secondary | ICD-10-CM | POA: Insufficient documentation

## 2022-06-14 MED ORDER — MODAFINIL 200 MG PO TABS: ORAL_TABLET | ORAL | 5 refills | Status: DC

## 2022-06-14 MED ORDER — NIRMATRELVIR/RITONAVIR (PAXLOVID)TABLET: 3.0000 | ORAL_TABLET | Freq: Two times a day (BID) | ORAL | 0 refills | Status: DC

## 2022-06-14 MED ORDER — MOLNUPIRAVIR EUA 200MG CAPSULE: 4.0000 | ORAL_CAPSULE | Freq: Two times a day (BID) | ORAL | 0 refills | Status: AC

## 2022-06-14 NOTE — Progress Notes (Signed)
Virtual Visit via Video note  I connected with Cameron [name] on 06/14/22 at 0910 by video and verified that I am speaking with the correct person using two identifiers. Cameron [name] is currently located at home. . The provider, Eugenia Pancoast, FNP is located in their office at time of visit.  I discussed the limitations, risks, security and privacy concerns of performing an evaluation and management service by video and the availability of in person appointments. I also discussed with the patient that there may be a patient responsible charge related to this service. The patient expressed understanding and agreed to proceed.  Subjective: PCP: Venia Carbon, MD  Chief Complaint  Patient presents with   Covid Positive    Sinus issues and drainage. Covid home test was negative last night.    HPI  Pt here with c/o symptoms since yesterday.  Sx started with symptoms that led to him testing for covid.   Does c/o sinus pressure, nasal congestion, and yesterday increased symptoms especially late at night. Denies fever/chills. No sore throat, no ear pain. Does have a nagging dry cough but thinks it is due to nasal drainage. No chest congestion.   Tested for covid and was positive last night. Today still with sinus pressure. Last night took a decongestant and mucinex with some mild relief.        ROS: Per HPI  Current Outpatient Medications:    cyclobenzaprine (FLEXERIL) 10 MG tablet, TAKE 1/2 -1 TABLETS (5-10 MG TOTAL) BY MOUTH AT BEDTIME., Disp: 30 tablet, Rfl: 2   fluticasone (FLONASE) 50 MCG/ACT nasal spray, Place 2 sprays into both nostrils daily. In each nostril, Disp: 16 g, Rfl: 12   gabapentin (NEURONTIN) 100 MG capsule, Take 1 capsule (100 mg total) by mouth every morning AND 2 capsules (200 mg total) at bedtime., Disp: 90 capsule, Rfl: 5   HYDROcodone-acetaminophen (NORCO) 7.5-325 MG tablet, Take 1 tablet by mouth every 6 (six) hours as needed., Disp: , Rfl:     hydrocortisone (ANUSOL-HC) 25 MG suppository, UNWRAP AND INSERT 1 SUPPOSITORY INTO THE RECTUM 3 TIMES DAILY AS NEEDED, Disp: 30 suppository, Rfl: 1   ibuprofen (ADVIL,MOTRIN) 200 MG tablet, Take 200-400 mg by mouth every 6 (six) hours as needed., Disp: , Rfl:    leflunomide (ARAVA) 20 MG tablet, Take 1 tablet (20 mg total) by mouth daily., Disp: 30 tablet, Rfl: 5   methylphenidate (RITALIN) 10 MG tablet, Take two po AM and one po at noon, Disp: 90 tablet, Rfl: 0   modafinil (PROVIGIL) 200 MG tablet, One pill po qAM and one po qnoon, Disp: 60 tablet, Rfl: 5   molnupiravir EUA (LAGEVRIO) 200 mg CAPS capsule, Take 4 capsules (800 mg total) by mouth 2 (two) times daily for 5 days., Disp: 40 capsule, Rfl: 0  Observations/Objective: Physical Exam Constitutional:      General: He is not in acute distress.    Appearance: Normal appearance. He is normal weight. He is not ill-appearing, toxic-appearing or diaphoretic.  Cardiovascular:     Rate and Rhythm: Normal rate.  Pulmonary:     Effort: Pulmonary effort is normal.  Musculoskeletal:        General: Normal range of motion.  Neurological:     General: No focal deficit present.     Mental Status: He is alert and oriented to person, place, and time. Mental status is at baseline.  Psychiatric:        Mood and Affect: Mood normal.  Behavior: Behavior normal.        Thought Content: Thought content normal.        Judgment: Judgment normal.     Assessment and Plan: COVID-19 Assessment & Plan: Advised of CDC guidelines for self isolation/ ending isolation.  Advised of safe practice guidelines. Symptom Tier reviewed.  Encouraged to monitor for any worsening symptoms; watch for increased shortness of breath, weakness, and signs of dehydration. Advised when to seek emergency care.  Instructed to rest and hydrate well.  Advised to leave the house during recommended isolation period, only if it is necessary to seek medical care  Pt considered  high risk for hospitalization. have decided pt is a candidate for antiviral and pt agrees that she would like to take this. I have sent in RX for molnupiravir 200 mg capsules to be taken as directed. Paxlovid not given due to potential interaction with patient's modafinal    Orders: -     molnupiravir EUA; Take 4 capsules (800 mg total) by mouth 2 (two) times daily for 5 days.  Dispense: 40 capsule; Refill: 0    Follow Up Instructions: Return for f/u with primary care provider if no improvement.   I discussed the assessment and treatment plan with the patient. The patient was provided an opportunity to ask questions and all were answered. The patient agreed with the plan and demonstrated an understanding of the instructions.   The patient was advised to call back or seek an in-person evaluation if the symptoms worsen or if the condition fails to improve as anticipated.  The above assessment and management plan was discussed with the patient. The patient verbalized understanding of and has agreed to the management plan. Patient is aware to call the clinic if symptoms persist or worsen. Patient is aware when to return to the clinic for a follow-up visit. Patient educated on when it is appropriate to go to the emergency department.   Time call ended: 0928  I provided 18 minutes of face-to-face time during this encounter.   Eugenia Pancoast, MSN, APRN, FNP-C [place]

## 2022-06-14 NOTE — Patient Instructions (Signed)
Person Under Monitoring Name: Cameron [name]  Location: [address] [place] [address]   Infection Prevention Recommendations for Individuals Confirmed to have, or Being Evaluated for, 2019 Novel Coronavirus (COVID-19) Infection Who Receive Care at Home  Individuals who are confirmed to have, or are being evaluated for, COVID-19 should follow the prevention steps below until a healthcare provider or local or state health department says they can return to normal activities.  Stay home except to get medical care You should restrict activities outside your home, except for getting medical care. Do not go to work, school, or public areas, and do not use public transportation or taxis.  Call ahead before visiting your doctor Before your medical appointment, call the healthcare provider and tell them that you have, or are being evaluated for, COVID-19 infection. This will help the healthcare provider's office take steps to keep other people from getting infected. Ask your healthcare provider to call the local or state health department.  Monitor your symptoms Seek prompt medical attention if your illness is worsening (e.g., difficulty breathing). Before going to your medical appointment, call the healthcare provider and tell them that you have, or are being evaluated for, COVID-19 infection. Ask your healthcare provider to call the local or state health department.  Wear a facemask You should wear a facemask that covers your nose and mouth when you are in the same room with other people and when you visit a healthcare provider. People who live with or visit you should also wear a facemask while they are in the same room with you.  Separate yourself from other people in your home As much as possible, you should stay in a different room from other people in your home. Also, you should use a separate bathroom, if available.  Avoid sharing household items You should not share  dishes, drinking glasses, cups, eating utensils, towels, bedding, or other items with other people in your home. After using these items, you should wash them thoroughly with soap and water.  Cover your coughs and sneezes Cover your mouth and nose with a tissue when you cough or sneeze, or you can cough or sneeze into your sleeve. Throw used tissues in a lined trash can, and immediately wash your hands with soap and water for at least 20 seconds or use an alcohol-based hand rub.  Wash your [facility] your hands often and thoroughly with soap and water for at least 20 seconds. You can use an alcohol-based hand sanitizer if soap and water are not available and if your hands are not visibly dirty. Avoid touching your eyes, nose, and mouth with unwashed hands.   Prevention Steps for Caregivers and Household Members of Individuals Confirmed to have, or Being Evaluated for, COVID-19 Infection Being Cared for in the Home  If you live with, or provide care at home for, a person confirmed to have, or being evaluated for, COVID-19 infection please follow these guidelines to prevent infection:  Follow healthcare provider's instructions Make sure that you understand and can help the patient follow any healthcare provider instructions for all care.  Provide for the patient's basic needs You should help the patient with basic needs in the home and provide support for getting groceries, prescriptions, and other personal needs.  Monitor the patient's symptoms If they are getting sicker, call his or her medical provider and tell them that the patient has, or is being evaluated for, COVID-19 infection. This will help the healthcare provider's  office take steps to keep other people from getting infected. Ask the healthcare provider to call the local or state health department.  Limit the number of people who have contact with the patient If possible, have only one caregiver for the patient. Other  household members should stay in another home or place of residence. If this is not possible, they should stay in another room, or be separated from the patient as much as possible. Use a separate bathroom, if available. Restrict visitors who do not have an essential need to be in the home.  Keep older adults, very Al[place] children, and other sick people away from the patient Keep older adults, very Gage children, and those who have compromised immune systems or chronic health conditions away from the patient. This includes people with chronic heart, lung, or kidney conditions, diabetes, and cancer.  Ensure good ventilation Make sure that shared spaces in the home have good air flow, such as from an air conditioner or an opened window, weather permitting.  Wash your hands often Wash your hands often and thoroughly with soap and water for at least 20 seconds. You can use an alcohol based hand sanitizer if soap and water are not available and if your hands are not visibly dirty. Avoid touching your eyes, nose, and mouth with unwashed hands. Use disposable paper towels to dry your hands. If not available, use dedicated cloth towels and replace them when they become wet.  Wear a facemask and gloves Wear a disposable facemask at all times in the room and gloves when you touch or have contact with the patient's blood, body fluids, and/or secretions or excretions, such as sweat, saliva, sputum, nasal mucus, vomit, urine, or feces.  Ensure the mask fits over your nose and mouth tightly, and do not touch it during use. Throw out disposable facemasks and gloves after using them. Do not reuse. Wash your hands immediately after removing your facemask and gloves. If your personal clothing becomes contaminated, carefully remove clothing and launder. Wash your hands after handling contaminated clothing. Place all used disposable facemasks, gloves, and other waste in a lined container before disposing them with  other household waste. Remove gloves and wash your hands immediately after handling these items.  Do not share dishes, glasses, or other household items with the patient Avoid sharing household items. You should not share dishes, drinking glasses, cups, eating utensils, towels, bedding, or other items with a patient who is confirmed to have, or being evaluated for, COVID-19 infection. After the person uses these items, you should wash them thoroughly with soap and water.  Wash laundry thoroughly Immediately remove and wash clothes or bedding that have blood, body fluids, and/or secretions or excretions, such as sweat, saliva, sputum, nasal mucus, vomit, urine, or feces, on them. Wear gloves when handling laundry from the patient. Read and follow directions on labels of laundry or clothing items and detergent. In general, wash and dry with the warmest temperatures recommended on the label.  Clean all areas the individual has used often Clean all touchable surfaces, such as counters, tabletops, doorknobs, bathroom fixtures, toilets, phones, keyboards, tablets, and bedside tables, every day. Also, clean any surfaces that may have blood, body fluids, and/or secretions or excretions on them. Wear gloves when cleaning surfaces the patient has come in contact with. Use a diluted bleach solution (e.g., dilute bleach with 1 part bleach and 10 parts water) or a household disinfectant with a label that says EPA-registered for coronaviruses. To make a  bleach solution at home, add 1 tablespoon of bleach to 1 quart (4 cups) of water. For a larger supply, add  cup of bleach to 1 gallon (16 cups) of water. Read labels of cleaning products and follow recommendations provided on product labels. Labels contain instructions for safe and effective use of the cleaning product including precautions you should take when applying the product, such as wearing gloves or eye protection and making sure you have good ventilation  during use of the product. Remove gloves and wash hands immediately after cleaning.  Monitor yourself for signs and symptoms of illness Caregivers and household members are considered close contacts, should monitor their health, and will be asked to limit movement outside of the home to the extent possible. Follow the monitoring steps for close contacts listed on the symptom monitoring form.   ? If you have additional questions, contact your local health department or call the epidemiologist on call at [phone] (available 24/7). ? This guidance is subject to change. For the most up-to-date guidance from [facility], please refer to their website: [email or web address].pl

## 2022-06-14 NOTE — Telephone Encounter (Signed)
Spoke to patient by telephone and scheduled a video visit today at 9:00 am with Eugenia Pancoast FNP.

## 2022-06-14 NOTE — Telephone Encounter (Signed)
Saw patient already, thank you for speaking with the patient.  Please see note for further information.

## 2022-06-14 NOTE — Assessment & Plan Note (Signed)
Advised of CDC guidelines for self isolation/ ending isolation.  Advised of safe practice guidelines. Symptom Tier reviewed.  Encouraged to monitor for any worsening symptoms; watch for increased shortness of breath, weakness, and signs of dehydration. Advised when to seek emergency care.  Instructed to rest and hydrate well.  Advised to leave the house during recommended isolation period, only if it is necessary to seek medical care  Pt considered high risk for hospitalization. have decided pt is a candidate for antiviral and pt agrees that she would like to take this. I have sent in RX for molnupiravir 200 mg capsules to be taken as directed. Paxlovid not given due to potential interaction with patient's modafinal

## 2022-06-23 ENCOUNTER — Other Ambulatory Visit: Payer: Self-pay | Admitting: Neurology

## 2022-06-23 MED ORDER — METHYLPHENIDATE HCL 10 MG PO TABS: ORAL_TABLET | ORAL | 0 refills | Status: DC

## 2022-06-23 NOTE — Telephone Encounter (Signed)
Last seen 01/10/22 and next f/u 07/12/22. Per drug registry, last refilled 05/18/22 #90

## 2022-06-23 NOTE — Telephone Encounter (Signed)
Pt request refill for methylphenidate (RITALIN) 10 MG tablet at [facility] [id number]

## 2022-07-01 ENCOUNTER — Other Ambulatory Visit: Payer: Self-pay | Admitting: Neurology

## 2022-07-01 DIAGNOSIS — G35 Multiple sclerosis: Secondary | ICD-10-CM

## 2022-07-08 ENCOUNTER — Encounter: Payer: BC Managed Care – PPO | Admitting: Internal Medicine

## 2022-07-11 ENCOUNTER — Ambulatory Visit (INDEPENDENT_AMBULATORY_CARE_PROVIDER_SITE_OTHER): Payer: BC Managed Care – PPO | Admitting: Internal Medicine

## 2022-07-11 ENCOUNTER — Encounter: Payer: Self-pay | Admitting: Internal Medicine

## 2022-07-11 VITALS — BP 124/84 | HR 57 | Temp 97.2°F | Ht 70.0 in | Wt 176.0 lb

## 2022-07-11 DIAGNOSIS — R5383 Other fatigue: Secondary | ICD-10-CM

## 2022-07-11 DIAGNOSIS — K219 Gastro-esophageal reflux disease without esophagitis: Secondary | ICD-10-CM

## 2022-07-11 DIAGNOSIS — G35 Multiple sclerosis: Secondary | ICD-10-CM

## 2022-07-11 DIAGNOSIS — M5416 Radiculopathy, lumbar region: Secondary | ICD-10-CM

## 2022-07-11 DIAGNOSIS — Z0000 Encounter for general adult medical examination without abnormal findings: Secondary | ICD-10-CM

## 2022-07-11 NOTE — Assessment & Plan Note (Signed)
Uses the hydrocodone prn

## 2022-07-11 NOTE — Progress Notes (Signed)
Subjective:    Patient ID: Cameron [name], male    DOB: 1955-10-29, 67 y.o.   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 30 tablet 2   fluticasone (FLONASE) 50 MCG/ACT nasal spray Place 2 sprays into both nostrils daily. In each nostril 16 g 12   gabapentin (NEURONTIN) 100 MG capsule Take 1 capsule (100 mg total) by mouth every morning AND 2 capsules (200 mg total) at bedtime. 90 capsule 5   HYDROcodone-acetaminophen (NORCO) 7.5-325 MG tablet Take 1 tablet by mouth every 6 (six) hours as needed.     hydrocortisone (ANUSOL-HC) 25 MG suppository UNWRAP AND INSERT 1 SUPPOSITORY INTO THE RECTUM 3 TIMES DAILY AS NEEDED 30 suppository 1   ibuprofen (ADVIL,MOTRIN) 200 MG tablet Take 200-400 mg by mouth every 6 (six) hours as needed.     leflunomide (ARAVA) 20 MG tablet TAKE 1 TABLET BY MOUTH DAILY 30 tablet 5   methylphenidate (RITALIN) 10 MG tablet Take two po AM and one po at noon 90 tablet 0   modafinil (PROVIGIL) 200 MG tablet One pill po qAM and one po qnoon 60 tablet 5   omeprazole (PRILOSEC) 20 MG capsule Take 20 mg by mouth daily.     No current facility-administered medications on file prior to visit.     Allergies  Allergen Reactions   Citalopram Hydrobromide     REACTION: unspecified   Penicillins Other (See Comments)    REACTION: questionable    Past Medical History:  Diagnosis Date   Abnormal MRI 2007   nonspecific white matter abnormalities   Allergy    seasonal   Anxiety    no meds   Arthritis    spine   GERD (gastroesophageal reflux disease)    History of kidney stones    x 2 episodes- last 2020   Hx of adenomatous colonic polyps 09/02/2021   2 diminutive adenomas recall 2030   Hyperlipidemia    no meds-? borderline   Migraines    OCULAR connected to MS   Neuromuscular disorder [facility])    MS    Past Surgical History:  Procedure Laterality Date   COLONOSCOPY  06/27/2007   Normal   CYSTOSCOPY/URETEROSCOPY/HOLMIUM LASER/STENT PLACEMENT Right 07/27/2018   Procedure: CYSTOSCOPY/URETEROSCOPY/HOLMIUM LASER/STENT PLACEMENT;  Surgeon: Abbie Sons, MD;  Location: [facility] ORS;  Service: Urology;  Laterality: Right;   LUMBAR DISC SURGERY     URETEROLITHOTOMY  11/16/2001   US ECHOCARDIOGRAPHY  11/16/2004   normal, Carotids normal    Family History  Problem Relation Age of Onset   Diabetes Father    Heart disease Father    Dementia Father    Lumbar disc disease Brother    Dementia Paternal Grandmother    CAD Neg  Hx    Hypertension Neg Hx    Cancer Neg Hx    Colon cancer Neg Hx    Colon polyps Neg Hx    Esophageal cancer Neg Hx    Stomach cancer Neg Hx    Rectal cancer Neg Hx     Social History   Socioeconomic History   Marital status: Married    Spouse name: Not on file   Number of children: Not on file   Years of education: Not on file   Highest education level: Not on file  Occupational History   Occupation: Designer, 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: [employer]  Tobacco Use   Smoking status: Never    Passive exposure: Past   Smokeless tobacco: Never  Vaping Use   Vaping Use: Never used  Substance and Sexual Activity    Alcohol use: Yes    Alcohol/week: 0.0 standard drinks of alcohol    Comment: rare- once a month   Drug use: No   Sexual activity: Not on file  Other Topics Concern   Not on file  Social History Narrative   Lives   Caffeine use:    Long standing relationship with partner--finally able to marry   Social Determinants of Health   Financial Resource Strain: Not on file  Food Insecurity: Not on file  Transportation Needs: Not on file  Physical Activity: Not on file  Stress: Not on file  Social Connections: Not on file  Intimate Partner Violence: Not on file   Review of Systems  Constitutional:  Positive for fatigue. Negative for unexpected weight change.       Wears seat belt  HENT:  Negative for dental problem, hearing loss and tinnitus.        Keeps up with dentist  Eyes:        Had optic neuritis last year---was too late to treat Mild vision decline since then (has seen [place])  Respiratory:  Negative for chest tightness and shortness of breath.        Some cough from reflux  Cardiovascular:  Positive for leg swelling. Negative for chest pain.       Some palpitations--?related to caffeine  Gastrointestinal:  Negative for blood in stool.       Uses miralax every other day usually  Endocrine: Positive for polydipsia. Negative for polyuria.  Genitourinary:  Negative for urgency.       Has some intermittent stream--some issues with incomplete emptying (not new) Wears shield for dribbling   Musculoskeletal:  Positive for back pain. Negative for joint swelling.  Skin:  Negative for rash.       No suspicious lesions  Allergic/Immunologic: Positive for environmental allergies. Negative for immunocompromised state.       Flonase is effective  Neurological:  Positive for dizziness. Negative for syncope and headaches.  Hematological:  Negative for adenopathy. Does not bruise/bleed easily.  Psychiatric/Behavioral:  Negative for dysphoric mood and sleep disturbance. The patient is  not nervous/anxious.        Objective:   Physical Exam Constitutional:      Appearance: Normal appearance.  HENT:     Mouth/Throat:     Pharynx: No oropharyngeal exudate or posterior oropharyngeal erythema.  Eyes:     Conjunctiva/sclera: Conjunctivae normal.     Pupils: Pupils are equal, round, and reactive to light.  Cardiovascular:     Rate and Rhythm: Normal rate and regular rhythm.     Pulses: Normal pulses.  Heart sounds:     No gallop.  Pulmonary:     Effort: Pulmonary effort is normal.     Breath sounds: Normal breath sounds. No wheezing or rales.  Abdominal:     Palpations: Abdomen is soft.     Tenderness: There is no abdominal tenderness.  Musculoskeletal:     Cervical back: Neck supple.     Right lower leg: No edema.     Left lower leg: No edema.  Lymphadenopathy:     Cervical: No cervical adenopathy.  Skin:    Findings: No lesion or rash.  Neurological:     General: No focal deficit present.     Mental Status: He is alert and oriented to person, place, and time.  Psychiatric:        Mood and Affect: Mood normal.        Behavior: Behavior normal.            Assessment & Plan:

## 2022-07-11 NOTE — Assessment & Plan Note (Signed)
Now on leflunomide for this (aubagio too much money)

## 2022-07-11 NOTE — Assessment & Plan Note (Signed)
On modafanil and ritalin

## 2022-07-11 NOTE — Assessment & Plan Note (Signed)
Healthy other than the MS Has let his lifestyle lapse---plans to bring it in after retiring soon Colon due again 2030 Will decide about PSA when Dr Felecia Shelling does blood work Not sure about vaccines ---will check with Dr Felecia Shelling

## 2022-07-11 NOTE — Assessment & Plan Note (Signed)
Has changed to omeprazole

## 2022-07-12 ENCOUNTER — Encounter: Payer: Self-pay | Admitting: Neurology

## 2022-07-12 ENCOUNTER — Ambulatory Visit: Payer: BC Managed Care – PPO | Admitting: Neurology

## 2022-07-12 VITALS — BP 116/74 | HR 76 | Ht 70.0 in | Wt 175.0 lb

## 2022-07-12 DIAGNOSIS — R5383 Other fatigue: Secondary | ICD-10-CM

## 2022-07-12 DIAGNOSIS — F988 Other specified behavioral and emotional disorders with onset usually occurring in childhood and adolescence: Secondary | ICD-10-CM | POA: Diagnosis not present

## 2022-07-12 DIAGNOSIS — N400 Enlarged prostate without lower urinary tract symptoms: Secondary | ICD-10-CM | POA: Insufficient documentation

## 2022-07-12 DIAGNOSIS — Z79899 Other long term (current) drug therapy: Secondary | ICD-10-CM

## 2022-07-12 DIAGNOSIS — M5126 Other intervertebral disc displacement, lumbar region: Secondary | ICD-10-CM

## 2022-07-12 DIAGNOSIS — G35 Multiple sclerosis: Secondary | ICD-10-CM | POA: Diagnosis not present

## 2022-07-12 MED ORDER — METHYLPHENIDATE HCL 20 MG PO TABS: ORAL_TABLET | ORAL | 0 refills | Status: DC

## 2022-07-12 MED ORDER — ARMODAFINIL 200 MG PO TABS: ORAL_TABLET | ORAL | 5 refills | Status: DC

## 2022-07-12 NOTE — Progress Notes (Signed)
[facility]  PATIENT: Cameron [name] DOB: Sep 14, 1955  REFERRING DOCTOR OR PCP: Viviana Simpler MD SOURCE: Patient, notes from primary care, imaging and laboratory reports, MRI images personally reviewed.  _________________________________   HISTORICAL  CHIEF COMPLAINT:  Chief Complaint  Patient presents with   Follow-up    Pt in room 10 here for MS follow up. Pt states being stable from [place] standpoint. Pt said he has noticed increase fatigue when not taking medication on time. Pt states he will be retiring in May. Needs modafinil refill.    HISTORY OF PRESENT ILLNESS:  Cameron [name] is a 67 y.o. man with relapsing remitting MS diagnosed 09/2019 but likely present since 2007 when he had visual disturbances. Marland Kitchen   Update  07/12/2022: He is on leflunomide.  This is more affordable with the teriflunomide and theoretically is equivalent.  He denies exacerbations.  He generally tolerates it well.    He has a lot of fatigue and takes Modafinil around 8 am and 1 pm --- many days he 'crashes' around 1130 am.  He sleeps 930 pm to midnight and from 2 am to 6 am.   He also takes methylphenidate 15 mg twice a day  He saw neurosurgery due to the cervical and lumbar degenerative changes.  He is currently getting SI joint injections.   His pain has generally done better.  He takes an occasional hydrocodone when pain is more intense.   Currently, gait is doing well.  He has no falls but feels balance is slightly worse and he uses the bannister now.  He has minimal left leg weakness and mild left leg spasticity with some effect on his gait.  No current visual issues but last year he had some visual field changes.   He notes some urinary urgency but no incontinence.    For the most part, bladder is fine.   Some urinary hesitancy. The word finding issues are better with methylphenidate.  He feels his mood has done better the last few months though he continues to feel he is more emotional  than he had been in the past..  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  He has SI joint pain and gets injection.       MS History:      He began to experience some neurologic symptoms in 2007.  That year, he had the onset of difficulties with eye movements.  Specifically, he was having difficulty tracking smoothly.   He also had some stumbling.  Over 2 monhs, his symptoms improved.   In 2013, he fell due to a left foot drop and continues to have mild left leg weakness. Since then he also has noted that he feels disoriented, has trouble coming up with the right words and veers to the left intermittently, especially in bright fluorescent lights as in some stores.  He also has had left hand numbness and reduced coordination for the last year.   He was diagnosed 11/05/2019 based on his history of optic neuritis, relapses with left-sided symptoms and additional changes in brain MRI and the presence of 2 foci in the cervical spine  Imaging studies: MRIs of the brain from 10/11/2005 and 10/29/2012:  The MRI show multiple T2/FLAIR hyperintense foci, at least 20, with some in the periventricular white matter and some in the juxtacortical white matter.  There was U-fiber involvement of a couple foci in the posterior left frontal lobe.  Between  2007 and 2014, 2-3 more lesions developed (the new lesions were mostly deep white matter).  We do not have any images of the cervical or thoracic spine spine so the spinal cord could not be reviewed.  MRI of the brain 10/15/2019 showed scattered T2/FLAIR hyperintense foci, some in the periventricular white matter radially oriented to the ventricles and a couple in the juxtacortical white matter..  Compared to the MRI from 2014, there were 3 or 4 new lesions.  None of the foci enhance.  MRI brain 04/29/2020 scattered T2/FLAIR hyperintense foci in the hemispheres in a pattern consistent with chronic demyelinating plaque associated  with multiple sclerosis.  None of the foci appear to be acute.  They do not enhance.  Compared to the MRI dated 04/29/2020, there are no new lesions.  MRI of the cervical and thoracic spine 10/15/2019 showed 2 T2 hyperintense foci within the spinal cord, 1 laterally to the left at C4 and the other posteriorly to the left adjacent to C6-C7.  There are degenerative changes at C4-C5 and C5-C6 with potential for left C5 nerve root compression.  There is mild spinal stenosis.  The thoracic spine showed a normal spinal cord and minimal degenerative changes at a couple levels that did not lead to nerve root compression or spinal stenosis.  MRI of the lumbar spine shows a large herniated disc at L2-L3 towards the left compressing the left L3 nerve root.  Additionally there is multilevel degenerative change elsewhere but with less potential for nerve root compression.   MRI brain 12/28/2021 showed Multiple T2/FLAIR hyperintense foci in the hemispheres in a pattern consistent with chronic demyelinating plaque associated with multiple sclerosis.  None of the foci enhanced or appear to be acute.  Compared to the MRI from 02/09/2021, there were no new lesions.  MRI cervical spine 912/2023 showed  IMPRESSION: This MRI of the cervical spine with and without contrast shows the following: Two T2 hyperintense foci within the final cord, laterally to the left adjacent to C4 and posterolaterally to the left adjacent to C6-C7.  Both of these were present on the 10/15/2019 MRI and all consistent with chronic demyelinating plaque associated with multiple sclerosis. At C4-C5, there are degenerative changes causing moderately severe left foraminal narrowing with potential for left C5 nerve root compression.  No spinal stenosis. At C5-C6, there are degenerative changes causing mild spinal stenosis and moderately severe left foraminal narrowing with potential for left C6 nerve root compression. Degenerative changes are essentially  unchanged compared to the 2021 MRI. Normal enhancement pattern.  REVIEW OF SYSTEMS: Constitutional: No fevers, chills, sweats, or change in appetite Eyes: As above Ear, nose and throat: No hearing loss, ear pain, nasal congestion, sore throat Cardiovascular: No chest pain, palpitations Respiratory:  No shortness of breath at rest or with exertion.   No wheezes GastrointestinaI: No nausea, vomiting, diarrhea, abdominal pain, fecal incontinence Genitourinary:  No dysuria, urinary retention.  He has mild frequency and nocturia.  He has a history of kidney stones. Musculoskeletal:  No neck pain, back pain Integumentary: No rash, pruritus, skin lesions Neurological: as above Psychiatric: No depression at this time.  No anxiety Endocrine: No palpitations, diaphoresis, change in appetite, change in weigh or increased thirst Hematologic/Lymphatic:  No anemia, purpura, petechiae. Allergic/Immunologic: No itchy/runny eyes, nasal congestion, recent allergic reactions, rashes  ALLERGIES: Allergies  Allergen Reactions   Citalopram Hydrobromide     REACTION: unspecified   Penicillins Other (See Comments)    REACTION: questionable    HOME  MEDICATIONS:  Current Outpatient Medications:    Armodafinil 200 MG TABS, One po qAM and one po qNoon, Disp: 60 tablet, Rfl: 5   cyclobenzaprine (FLEXERIL) 10 MG tablet, TAKE 1/2 -1 TABLETS (5-10 MG TOTAL) BY MOUTH AT BEDTIME., Disp: 30 tablet, Rfl: 2   fluticasone (FLONASE) 50 MCG/ACT nasal spray, Place 2 sprays into both nostrils daily. In each nostril, Disp: 16 g, Rfl: 12   gabapentin (NEURONTIN) 100 MG capsule, Take 1 capsule (100 mg total) by mouth every morning AND 2 capsules (200 mg total) at bedtime., Disp: 90 capsule, Rfl: 5   HYDROcodone-acetaminophen (NORCO) 7.5-325 MG tablet, Take 1 tablet by mouth every 6 (six) hours as needed., Disp: , Rfl:    hydrocortisone (ANUSOL-HC) 25 MG suppository, UNWRAP AND INSERT 1 SUPPOSITORY INTO THE RECTUM 3 TIMES  DAILY AS NEEDED, Disp: 30 suppository, Rfl: 1   ibuprofen (ADVIL,MOTRIN) 200 MG tablet, Take 200-400 mg by mouth every 6 (six) hours as needed., Disp: , Rfl:    leflunomide (ARAVA) 20 MG tablet, TAKE 1 TABLET BY MOUTH DAILY, Disp: 30 tablet, Rfl: 5   omeprazole (PRILOSEC) 20 MG capsule, Take 20 mg by mouth daily., Disp: , Rfl:    methylphenidate (RITALIN) 20 MG tablet, Take one po AM and one po at noon, Disp: 60 tablet, Rfl: 0  PAST MEDICAL HISTORY: Past Medical History:  Diagnosis Date   Abnormal MRI 2007   nonspecific white matter abnormalities   Allergy    seasonal   Anxiety    no meds   Arthritis    spine   GERD (gastroesophageal reflux disease)    History of kidney stones    x 2 episodes- last 2020   Hx of adenomatous colonic polyps 09/02/2021   2 diminutive adenomas recall 2030   Hyperlipidemia    no meds-? borderline   Migraines    OCULAR connected to MS   Neuromuscular disorder ([place])    MS    PAST SURGICAL HISTORY: Past Surgical History:  Procedure Laterality Date   COLONOSCOPY  06/27/2007   Normal   CYSTOSCOPY/URETEROSCOPY/HOLMIUM LASER/STENT PLACEMENT Right 07/27/2018   Procedure: CYSTOSCOPY/URETEROSCOPY/HOLMIUM LASER/STENT PLACEMENT;  Surgeon: Abbie Sons, MD;  Location: [facility];  Service: Urology;  Laterality: Right;   LUMBAR DISC SURGERY     URETEROLITHOTOMY  11/16/2001   US ECHOCARDIOGRAPHY  11/16/2004   normal, Carotids normal    FAMILY HISTORY: Family History  Problem Relation Age of Onset   Diabetes Father    Heart disease Father    Dementia Father    Lumbar disc disease Brother    Dementia Paternal Grandmother    CAD Neg Hx    Hypertension Neg Hx    Cancer Neg Hx    Colon cancer Neg Hx    Colon polyps Neg Hx    Esophageal cancer Neg Hx    Stomach cancer Neg Hx    Rectal cancer Neg Hx     SOCIAL HISTORY:  Social History   Socioeconomic History   Marital status: Married    Spouse name: Not on file   Number of children: Not on  file   Years of education: Not on file   Highest education level: Not on file  Occupational History   Occupation: Designer, jewellery in Scientist, research (life sciences) Industrial/product designer: [employer]  Tobacco Use   Smoking status: Never    Passive exposure: Past   Smokeless tobacco: Never  Vaping Use   Vaping Use: Never used  Substance and Sexual  Activity   Alcohol use: Yes    Alcohol/week: 0.0 standard drinks of alcohol    Comment: rare- once a month   Drug use: No   Sexual activity: Not on file  Other Topics Concern   Not on file  Social History Narrative   Lives   Caffeine use:    Long standing relationship with partner--finally able to marry   Social Determinants of Health   Financial Resource Strain: Not on file  Food Insecurity: Not on file  Transportation Needs: Not on file  Physical Activity: Not on file  Stress: Not on file  Social Connections: Not on file  Intimate Partner Violence: Not on file     PHYSICAL EXAM  Vitals:   07/12/22 0815  BP: 116/74  Pulse: 76  Weight: 175 lb (79.4 kg)  Height: 5\' 10"  (1.778 m)    Body mass index is 25.11 kg/m.   General: The patient is well-developed and well-nourished and in no acute distress  Neck:  The neck is nontender.  Skin: Extremities are without rash or  edema.  Neurologic Exam  Mental status: The patient is alert and oriented x 3 at the time of the examination. The patient has apparent normal recent and remote memory, with an apparently normal attention span and concentration ability.   Speech is normal.  Cranial nerves: Extraocular movements are full. Facial strength is fine. No dysarthria is noted.No obvious hearing deficits are noted.  Motor:  Muscle bulk is normal.   Tone is slightly increased in the left leg relative to the right. Strength is  5 / 5 in all 4 extremities except 4+/5 proximal left leg..   Sensory: Sensory testing is intact to soft touch and vibration sensation in all 4  extremities.  Coordination: Cerebellar testing reveals good finger-nose-finger and heel-to-shin bilaterally.  Gait and station: Station is normal.    The gait has a normal stride.  There is a very slight left foot drop.  His tandem gait is mildly wide.  Romberg is negative.  Reflexes: Deep tendon reflexes are symmetric and normal in arms but increased at knees, left > right (with spread) and at the ankles (though no clonus)  DIAGNOSTIC DATA (LABS, IMAGING, TESTING) - I reviewed patient records, labs, notes, testing and imaging myself where available.  Lab Results  Component Value Date   WBC 4.2 01/10/2022   HGB 14.4 01/10/2022   HCT 42.7 01/10/2022   MCV 91 01/10/2022   PLT 254 01/10/2022      Component Value Date/Time   NA 135 01/10/2022 1057   K 5.2 01/10/2022 1057   CL 98 01/10/2022 1057   CO2 22 01/10/2022 1057   GLUCOSE 92 01/10/2022 1057   GLUCOSE 76 07/07/2021 0929   BUN 8 01/10/2022 1057   CREATININE 0.84 01/10/2022 1057   CREATININE 1.13 11/21/2016 1224   CALCIUM 9.2 01/10/2022 1057   PROT 7.0 01/10/2022 1057   ALBUMIN 4.3 01/10/2022 1057   AST 20 01/10/2022 1057   ALT 12 01/10/2022 1057   ALKPHOS 97 01/10/2022 1057   BILITOT 0.3 01/10/2022 1057   GFRNONAA 83 11/04/2019 1433   GFRAA 96 11/04/2019 1433   Lab Results  Component Value Date   CHOL 248 (H) 11/21/2016   HDL 43 11/21/2016   LDLCALC 155 (H) 11/21/2016   LDLDIRECT 136.0 10/21/2014   TRIG 249 (H) 11/21/2016   CHOLHDL 5.8 (H) 11/21/2016   No results found for: "HGBA1C" Lab Results  Component Value Date   VITAMINB12  239 07/07/2021   Lab Results  Component Value Date   TSH 5.45 (H) 10/16/2013       ASSESSMENT AND PLAN  Multiple sclerosis ([place]) - Plan: Comprehensive metabolic panel, CBC with Differential/Platelet  Prostate hypertrophy - Plan: PSA  High risk medication use - Plan: Comprehensive metabolic panel, CBC with Differential/Platelet  ADD (attention deficit disorder) without  hyperactivity  Other fatigue  Lumbar disc herniation   1.  Continue Leflunomide (related to teriflunomide)..   2.   Increase methylphenidate.    Change modafinil to armodafinil as it wears off quickly 3.   Continue seeing pain management for lumbar issues.   4.   Stay active and exercise as tolerated 5.  Return to see me in 6 months or sooner if there are new or worsening neurologic symptoms.     Robey Massmann A. Felecia Shelling, MD, [facility] [id number], [id number] AM Certified in Neurology, Clinical Neurophysiology, Sleep Medicine and Neuroimaging  [facility] [address], [place], [place] [address] [phone]

## 2022-07-13 LAB — COMPREHENSIVE METABOLIC PANEL
ALT: 13 IU/L (ref 0–44)
AST: 19 IU/L (ref 0–40)
Albumin/Globulin Ratio: 1.7 (ref 1.2–2.2)
Albumin: 4.5 g/dL (ref 3.9–4.9)
Alkaline Phosphatase: 102 IU/L (ref 44–121)
BUN/Creatinine Ratio: 9 — ABNORMAL LOW (ref 10–24)
BUN: 9 mg/dL (ref 8–27)
Bilirubin Total: 0.4 mg/dL (ref 0.0–1.2)
CO2: 24 mmol/L (ref 20–29)
Calcium: 9.1 mg/dL (ref 8.6–10.2)
Chloride: 99 mmol/L (ref 96–106)
Creatinine, Ser: 0.95 mg/dL (ref 0.76–1.27)
Globulin, Total: 2.6 g/dL (ref 1.5–4.5)
Glucose: 79 mg/dL (ref 70–99)
Potassium: 4.6 mmol/L (ref 3.5–5.2)
Sodium: 136 mmol/L (ref 134–144)
Total Protein: 7.1 g/dL (ref 6.0–8.5)
eGFR: 89 mL/min/{1.73_m2} (ref >59)

## 2022-07-13 LAB — CBC WITH DIFFERENTIAL/PLATELET
Basophils Absolute: 0.1 10*3/uL (ref 0.0–0.2)
Basos: 1 %
EOS (ABSOLUTE): 0.1 10*3/uL (ref 0.0–0.4)
Eos: 1 %
Hematocrit: 44 % (ref 37.5–51.0)
Hemoglobin: 14.9 g/dL (ref 13.0–17.7)
Immature Grans (Abs): 0 10*3/uL (ref 0.0–0.1)
Immature Granulocytes: 0 %
Lymphocytes Absolute: 1.4 10*3/uL (ref 0.7–3.1)
Lymphs: 34 %
MCH: 30.7 pg (ref 26.6–33.0)
MCHC: 33.9 g/dL (ref 31.5–35.7)
MCV: 91 fL (ref 79–97)
Monocytes Absolute: 0.4 10*3/uL (ref 0.1–0.9)
Monocytes: 11 %
Neutrophils Absolute: 2.1 10*3/uL (ref 1.4–7.0)
Neutrophils: 53 %
Platelets: 238 10*3/uL (ref 150–450)
RBC: 4.85 x10E6/uL (ref 4.14–5.80)
RDW: 11.8 % (ref 11.6–15.4)
WBC: 4 10*3/uL (ref 3.4–10.8)

## 2022-07-13 LAB — PSA: Prostate Specific Ag, Serum: 1.4 ng/mL (ref 0.0–4.0)

## 2022-07-20 ENCOUNTER — Telehealth: Payer: Self-pay | Admitting: *Deleted

## 2022-07-20 ENCOUNTER — Other Ambulatory Visit: Payer: Self-pay | Admitting: *Deleted

## 2022-07-20 MED ORDER — ARMODAFINIL 200 MG PO TABS: 1.0000 | ORAL_TABLET | Freq: Every morning | ORAL | 5 refills | Status: DC

## 2022-07-20 NOTE — Telephone Encounter (Signed)
Received fax from [place] that insurance will only cover 30 tablets per 30 days and not 60.   I spoke with Dr. Felecia Shelling. He said we will proceed with pt getting 30/30 since insurance has this restriction. I sent pt mychart message with update.

## 2022-07-20 NOTE — Telephone Encounter (Signed)
Submitted PA armodafinil on covermymeds. Key: [id number]. Waiting on determination from [place].

## 2022-07-21 ENCOUNTER — Other Ambulatory Visit: Payer: Self-pay | Admitting: Neurology

## 2022-07-22 ENCOUNTER — Encounter: Payer: Self-pay | Admitting: Neurology

## 2022-07-22 ENCOUNTER — Encounter: Payer: Self-pay | Admitting: Internal Medicine

## 2022-07-25 ENCOUNTER — Other Ambulatory Visit: Payer: Self-pay | Admitting: Neurology

## 2022-07-27 ENCOUNTER — Other Ambulatory Visit: Payer: Self-pay | Admitting: Neurology

## 2022-07-27 MED ORDER — MODAFINIL 200 MG PO TABS: 200.0000 mg | ORAL_TABLET | Freq: Every day | ORAL | 5 refills | Status: DC

## 2022-08-01 MED ORDER — MODAFINIL 200 MG PO TABS: 200.0000 mg | ORAL_TABLET | Freq: Every day | ORAL | 1 refills | Status: DC

## 2022-08-08 ENCOUNTER — Other Ambulatory Visit: Payer: Self-pay | Admitting: Neurology

## 2022-08-08 MED ORDER — METHYLPHENIDATE HCL 20 MG PO TABS: ORAL_TABLET | ORAL | 0 refills | Status: DC

## 2022-08-08 NOTE — Telephone Encounter (Signed)
Pt is requesting a refill for methylphenidate (RITALIN) 20 MG tablet .  Pharmacy: [facility] [id number]

## 2022-08-08 NOTE — Telephone Encounter (Signed)
Last seen 07/12/22 and next f/u 01/25/23. Last refilled rx 07/13/22 #60 per drug registry.

## 2022-09-06 ENCOUNTER — Other Ambulatory Visit: Payer: Self-pay | Admitting: Neurology

## 2022-09-06 NOTE — Telephone Encounter (Signed)
Pt last seen on 07/12/22   Follow up scheduled on 01/25/23  Last filled on 08/07/22 #90 tablets (30 day supply)  Dr.Sater pt was seen on 07/12/22 I didn't see any mention of patient continuing gabapentin Rx.

## 2022-09-09 ENCOUNTER — Other Ambulatory Visit: Payer: Self-pay | Admitting: Neurology

## 2022-09-09 NOTE — Telephone Encounter (Signed)
Pt requesting a refill on methylphenidate (RITALIN) 20 MG tablet. Should be sent to [facility] [id number]

## 2022-09-13 MED ORDER — METHYLPHENIDATE HCL 20 MG PO TABS: ORAL_TABLET | ORAL | 0 refills | Status: DC

## 2022-09-13 NOTE — Telephone Encounter (Signed)
Last seen on 07/12/22  Follow up scheduled on 01/25/23 Lat filled on 08/08/22 #60 tablets  Rx pending to be signed

## 2022-10-10 ENCOUNTER — Other Ambulatory Visit: Payer: Self-pay | Admitting: Neurology

## 2022-10-11 MED ORDER — METHYLPHENIDATE HCL 20 MG PO TABS: ORAL_TABLET | ORAL | 0 refills | Status: DC

## 2022-11-10 ENCOUNTER — Other Ambulatory Visit: Payer: Self-pay | Admitting: Neurology

## 2022-11-10 MED ORDER — METHYLPHENIDATE HCL 20 MG PO TABS: ORAL_TABLET | ORAL | 0 refills | Status: DC

## 2022-11-10 NOTE — Telephone Encounter (Signed)
Pt is requesting a refill for methylphenidate (RITALIN) 20 MG tablet .  Pharmacy: [facility] [id number]

## 2022-11-10 NOTE — Telephone Encounter (Signed)
Last seen on 07/12/22 Follow up scheduled on 01/25/23 Last filled on 10/11/22 #60 tablets (30 day supply) Rx pending to be signed

## 2022-11-10 NOTE — Addendum Note (Signed)
Addended by: Aura Camps on: 11/10/2022 09:23 AM   Modules accepted: Orders

## 2022-11-21 ENCOUNTER — Other Ambulatory Visit: Payer: Self-pay | Admitting: Neurology

## 2022-11-21 ENCOUNTER — Encounter: Payer: Self-pay | Admitting: Neurology

## 2022-11-21 DIAGNOSIS — G35 Multiple sclerosis: Secondary | ICD-10-CM

## 2022-11-22 ENCOUNTER — Telehealth: Payer: Self-pay | Admitting: Neurology

## 2022-11-22 NOTE — Telephone Encounter (Signed)
He is scheduled at [facility] on 8/13 at 9am.

## 2022-11-22 NOTE — Addendum Note (Signed)
Addended by: Deatra James on: 11/22/2022 01:41 PM   Modules accepted: Orders

## 2022-11-22 NOTE — Telephone Encounter (Signed)
Pt scheduled for 45 mins MR brain w/wo contrast at [facility] for 11/29/22 at 9am. Pt is requesting medication to help with claustrophobia.  [employer]BS auth# [id number] (11/22/22-12/21/22)

## 2022-11-23 ENCOUNTER — Encounter: Payer: Self-pay | Admitting: Neurology

## 2022-11-23 MED ORDER — ALPRAZOLAM 0.5 MG PO TABS: ORAL_TABLET | ORAL | 0 refills | Status: AC

## 2022-11-23 NOTE — Addendum Note (Signed)
Addended by: Despina Arias A on: 11/23/2022 12:35 PM   Modules accepted: Orders

## 2022-11-29 ENCOUNTER — Ambulatory Visit (INDEPENDENT_AMBULATORY_CARE_PROVIDER_SITE_OTHER): Payer: Medicare Other

## 2022-11-29 DIAGNOSIS — G35 Multiple sclerosis: Secondary | ICD-10-CM | POA: Diagnosis not present

## 2022-11-29 MED ORDER — GADOBENATE DIMEGLUMINE 529 MG/ML IV SOLN
15.0000 mL | Freq: Once | INTRAVENOUS | Status: AC | PRN
  Administered 2022-11-29: 15 mL via INTRAVENOUS

## 2022-12-08 ENCOUNTER — Other Ambulatory Visit: Payer: Self-pay | Admitting: Neurology

## 2022-12-08 MED ORDER — METHYLPHENIDATE HCL 20 MG PO TABS: ORAL_TABLET | ORAL | 0 refills | Status: DC

## 2022-12-08 NOTE — Telephone Encounter (Signed)
Pt is needing a refill request for his methylphenidate (RITALIN) 20 MG tablet sent in to the [facility] in [place]

## 2022-12-08 NOTE — Telephone Encounter (Signed)
Last seen on 07/12/22 Follow up scheduled on 01/25/23 Last filled on 11/10/22 #60 tablets  Rx pending to be signed

## 2022-12-28 ENCOUNTER — Ambulatory Visit
Admission: EM | Admit: 2022-12-28 | Discharge: 2022-12-28 | Disposition: A | Discharge status: Home / Self Care | Payer: Medicare PPO | Attending: Emergency Medicine | Admitting: Emergency Medicine

## 2022-12-28 DIAGNOSIS — R21 Rash and other nonspecific skin eruption: Secondary | ICD-10-CM

## 2022-12-28 DIAGNOSIS — W57XXXA Bitten or stung by nonvenomous insect and other nonvenomous arthropods, initial encounter: Secondary | ICD-10-CM

## 2022-12-28 MED ORDER — PREDNISONE 10 MG (21) PO TBPK: ORAL_TABLET | Freq: Every day | ORAL | 0 refills | Status: DC

## 2022-12-28 NOTE — ED Provider Notes (Signed)
Cameron [name]    CSN: [id number] Arrival date & time: 12/28/22  2725      History   Chief Complaint Chief Complaint  Patient presents with   Insect Bite    HPI Cameron [name] is a 67 y.o. male.  Patient presents with multiple ant bites on his right leg since yesterday when he accidentally stepped in an ant's nest.  He has a few bites on his left ankle also.  He has been using cortisone cream, topical lidocaine, and he took Benadryl x 2 doses yesterday.  No OTC medications today.  No fever, difficulty swallowing, shortness of breath, or other symptoms.  His medical history includes multiple sclerosis, seasonal allergies, hyperlipidemia, kidney stones, GERD, left foot drop, hearing loss, prostate hypertrophy.  The history is provided by the patient and medical records.    Past Medical History:  Diagnosis Date   Abnormal MRI 2007   nonspecific white matter abnormalities   Allergy    seasonal   Anxiety    no meds   Arthritis    spine   GERD (gastroesophageal reflux disease)    History of kidney stones    x 2 episodes- last 2020   Hx of adenomatous colonic polyps 09/02/2021   2 diminutive adenomas recall 2030   Hyperlipidemia    no meds-? 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   Hx of adenomatous colonic polyps 09/02/2021   Right optic neuritis 02/04/2021   Lumbar radiculopathy 06/25/2020   Other fatigue 06/25/2020   Lumbar disc herniation 05/01/2020   High risk medication use 11/04/2019   Vitamin D deficiency 11/04/2019   Left foot drop 11/04/2019   Vision disturbance 11/04/2019   Abnormal MRI of head 09/24/2019   Ureteral calculus 07/25/2018   Hydronephrosis with urinary obstruction due to ureteral calculus 07/25/2018   Sensorineural hearing loss  (SNHL), bilateral 03/06/2017   Low back pain 05/29/2014   Routine general medical examination at a health care facility 02/23/2012   GERD (gastroesophageal reflux disease)    Hyperlipemia 10/03/2007   Seasonal allergic rhinitis due to pollen 07/19/2006   RENAL CALCULUS 07/19/2006   Multiple sclerosis (HCC) 10/16/2005    Past Surgical History:  Procedure Laterality Date   COLONOSCOPY  06/27/2007   Normal   CYSTOSCOPY/URETEROSCOPY/HOLMIUM LASER/STENT PLACEMENT Right 07/27/2018   Procedure: CYSTOSCOPY/URETEROSCOPY/HOLMIUM LASER/STENT PLACEMENT;  Surgeon: Riki Altes, MD;  Location: [facility] ORS;  Service: Urology;  Laterality: Right;   LUMBAR DISC SURGERY     URETEROLITHOTOMY  11/16/2001   US ECHOCARDIOGRAPHY  11/16/2004   normal, Carotids normal       Home Medications    Prior to Admission medications   Medication Sig Start Date End Date Taking? Authorizing Provider  modafinil (PROVIGIL) 200 MG tablet Take 1 tablet (200 mg total) by mouth daily. 07/27/22   Sater, Pearletha Furl, MD  predniSONE (STERAPRED UNI-PAK 21 TAB) 10 MG (21) TBPK tablet Take by mouth daily. As directed 12/28/22  Yes Mickie Bail, NP  ALPRAZolam Prudy Feeler) 0.5 MG tablet Take 2 tablets thirty minutes prior to MRI.  May take one additional tablet before entering scanner, if needed.  MUST HAVE DRIVER. 06/22/62   Sater, Pearletha Furl, MD  Armodafinil 200 MG TABS Take 1 tablet (200 mg total) by  mouth in the morning. One po qAM and one po qNoon 07/20/22   Sater, Pearletha Furl, MD  cyclobenzaprine (FLEXERIL) 10 MG tablet TAKE 1/2 -1 TABLETS (5-10 MG TOTAL) BY MOUTH AT BEDTIME. 09/15/20   Copland, Karleen Hampshire, MD  fluticasone (FLONASE) 50 MCG/ACT nasal spray Place 2 sprays into both nostrils daily. In each nostril 11/18/15   Karie Schwalbe, MD  gabapentin (NEURONTIN) 100 MG capsule TAKE ONE CAPSULE BY MOUTH EVERY MORNING AND TAKE TWO CAPSULES BY MOUTH AT BEDTIME 09/06/22   Sater, Pearletha Furl, MD  HYDROcodone-acetaminophen (NORCO) 7.5-325 MG  tablet Take 1 tablet by mouth every 6 (six) hours as needed. Patient not taking: Reported on 12/28/2022 06/14/21   [provider]  hydrocortisone (ANUSOL-HC) 25 MG suppository UNWRAP AND INSERT 1 SUPPOSITORY INTO THE RECTUM 3 TIMES DAILY AS NEEDED 04/01/22   Karie Schwalbe, MD  ibuprofen (ADVIL,MOTRIN) 200 MG tablet Take 200-400 mg by mouth every 6 (six) hours as needed.    [provider]  leflunomide (ARAVA) 20 MG tablet TAKE 1 TABLET BY MOUTH DAILY 07/01/22   Sater, Pearletha Furl, MD  methylphenidate (RITALIN) 20 MG tablet Take one po AM and one po at noon 12/11/22   Sater, Richard A, MD  modafinil (PROVIGIL) 200 MG tablet Take 1 tablet (200 mg total) by mouth daily. 08/01/22   Sater, Pearletha Furl, MD  omeprazole (PRILOSEC) 20 MG capsule Take 20 mg by mouth daily.    [provider]    Family History Family History  Problem Relation Age of Onset   Diabetes Father    Heart disease Father    Dementia Father    Lumbar disc disease Brother    Dementia Paternal Grandmother    CAD Neg Hx    Hypertension Neg Hx    Cancer Neg Hx    Colon cancer Neg Hx    Colon polyps Neg Hx    Esophageal cancer Neg Hx    Stomach cancer Neg Hx    Rectal cancer Neg Hx     Social History Social History   Tobacco Use   Smoking status: Never    Passive exposure: Past   Smokeless tobacco: Never  Vaping Use   Vaping status: Never Used  Substance Use Topics   Alcohol use: Yes    Alcohol/week: 0.0 standard drinks of alcohol    Comment: rare- once a month   Drug use: No     Allergies   Citalopram hydrobromide and Penicillins   Review of Systems Review of Systems  Constitutional:  Negative for chills and fever.  HENT:  Negative for sore throat, trouble swallowing and voice change.   Respiratory:  Negative for cough and shortness of breath.   Skin:  Positive for rash and wound.     Physical Exam Triage Vital Signs ED Triage Vitals [12/28/22 0844]  Encounter Vitals Group      BP      Systolic BP Percentile      Diastolic BP Percentile      Pulse Rate 100     Resp 18     Temp 97.8 F (36.6 C)     Temp src      SpO2 98 %     Weight      Height      Head Circumference      Peak Flow      Pain Score      Pain Loc      Pain Education  Exclude from Growth Chart    No data found.  Updated Vital Signs BP (!) 133/91   Pulse 100   Temp 97.8 F (36.6 C)   Resp 18   SpO2 98%   Visual Acuity Right Eye Distance:   Left Eye Distance:   Bilateral Distance:    Right Eye Near:   Left Eye Near:    Bilateral Near:     Physical Exam Constitutional:      General: He is not in acute distress.    Appearance: Normal appearance.  HENT:     Mouth/Throat:     Mouth: Mucous membranes are moist.  Cardiovascular:     Rate and Rhythm: Normal rate and regular rhythm.  Pulmonary:     Effort: Pulmonary effort is normal. No respiratory distress.  Skin:    General: Skin is warm and dry.     Findings: Rash present.     Comments: Numerous pustules on RLE.  See pictures.   Neurological:     Mental Status: He is alert.  Psychiatric:        Mood and Affect: Mood normal.        Behavior: Behavior normal.         UC Treatments / Results  Labs (all labs ordered are listed, but only abnormal results are displayed) Labs Reviewed - No data to display  EKG   Radiology No results found.  Procedures Procedures (including critical care time)  Medications Ordered in UC Medications - No data to display  Initial Impression / Assessment and Plan / UC Course  I have reviewed the triage vital signs and the nursing notes.  Pertinent labs & imaging results that were available during my care of the patient were reviewed by me and considered in my medical decision making (see chart for details).    Rash due to insect bites.  No difficulty swallowing or breathing.  Treating today with prednisone taper and Zyrtec.  Wound care instructions discussed.   Education provided on insect bites.  Instructed patient to follow-up with his PCP.  He agrees to plan of care.  Final Clinical Impressions(s) / UC Diagnoses   Final diagnoses:  Rash  Insect bite, unspecified site, initial encounter     Discharge Instructions      Take the prednisone taper as directed.    Take Zyrtec as directed.    Follow up with your primary care provider.       ED Prescriptions     Medication Sig Dispense Auth. Provider   predniSONE (STERAPRED UNI-PAK 21 TAB) 10 MG (21) TBPK tablet Take by mouth daily. As directed 21 tablet Mickie Bail, NP      PDMP not reviewed this encounter.   Mickie Bail, NP 12/28/22 [phone]

## 2022-12-28 NOTE — ED Triage Notes (Signed)
Patient to Urgent Care with complaints of insect bites present to bilateral lower extremities. Right leg significantly worse.   Symptoms started yesterday. Reports he was outside doing yard work and he rubbed up against an ant's nest.   Has been applying benadryl/ cortisone/ lidocaine creams. Taking benadryl/ using ice.   Patient has MS and is concerned this might cause a flare.

## 2022-12-28 NOTE — Discharge Instructions (Addendum)
Take the prednisone taper as directed.    Take Zyrtec as directed.    Follow up with your primary care provider.

## 2022-12-31 ENCOUNTER — Other Ambulatory Visit: Payer: Self-pay | Admitting: Neurology

## 2022-12-31 DIAGNOSIS — G35 Multiple sclerosis: Secondary | ICD-10-CM

## 2023-01-02 NOTE — Telephone Encounter (Signed)
Last seen on 07/12/22 Follow up scheduled on 01/25/23

## 2023-01-16 ENCOUNTER — Other Ambulatory Visit: Payer: Self-pay

## 2023-01-16 ENCOUNTER — Other Ambulatory Visit: Payer: Self-pay | Admitting: Neurology

## 2023-01-16 MED ORDER — METHYLPHENIDATE HCL 20 MG PO TABS: ORAL_TABLET | ORAL | 0 refills | Status: DC

## 2023-01-16 NOTE — Telephone Encounter (Signed)
   Last appointment: 07/12/2022 Next appointment: 01/25/2023

## 2023-01-16 NOTE — Telephone Encounter (Signed)
Pt request refill for methylphenidate (RITALIN) 20 MG tablet sent to  [facility] [id number]

## 2023-01-25 ENCOUNTER — Ambulatory Visit: Payer: Medicare PPO | Admitting: Neurology

## 2023-01-25 ENCOUNTER — Telehealth: Payer: Self-pay | Admitting: *Deleted

## 2023-01-25 ENCOUNTER — Encounter: Payer: Self-pay | Admitting: Neurology

## 2023-01-25 VITALS — BP 132/83 | HR 87 | Ht 70.5 in | Wt 176.6 lb

## 2023-01-25 DIAGNOSIS — H469 Unspecified optic neuritis: Secondary | ICD-10-CM

## 2023-01-25 DIAGNOSIS — F988 Other specified behavioral and emotional disorders with onset usually occurring in childhood and adolescence: Secondary | ICD-10-CM

## 2023-01-25 DIAGNOSIS — Z79899 Other long term (current) drug therapy: Secondary | ICD-10-CM

## 2023-01-25 DIAGNOSIS — G35 Multiple sclerosis: Secondary | ICD-10-CM | POA: Diagnosis not present

## 2023-01-25 DIAGNOSIS — M5126 Other intervertebral disc displacement, lumbar region: Secondary | ICD-10-CM

## 2023-01-25 DIAGNOSIS — R5383 Other fatigue: Secondary | ICD-10-CM | POA: Diagnosis not present

## 2023-01-25 DIAGNOSIS — R4189 Other symptoms and signs involving cognitive functions and awareness: Secondary | ICD-10-CM

## 2023-01-25 MED ORDER — HYDROCODONE-ACETAMINOPHEN 7.5-325 MG PO TABS: ORAL_TABLET | ORAL | 0 refills | Status: DC

## 2023-01-25 MED ORDER — CYCLOBENZAPRINE HCL 10 MG PO TABS: 10.0000 mg | ORAL_TABLET | Freq: Every day | ORAL | 3 refills | Status: DC

## 2023-01-25 MED ORDER — HYDROCODONE-ACETAMINOPHEN 7.5-325 MG PO TABS: 0.5000 | ORAL_TABLET | Freq: Two times a day (BID) | ORAL | 0 refills | Status: DC | PRN

## 2023-01-25 NOTE — Telephone Encounter (Signed)
Took call from phone room and spoke with Darl Pikes at [facility]. They received rx hyrdocodone Dr. Epimenio Foot sent today fro #[phone] but pt considered opioid naive on their end. Can only fill a 7 days supply to start. Requesting a separate rx be sent for this and they will place 30 days supply on hold and fill it when pt due. Aware I will send to MD.

## 2023-01-25 NOTE — Progress Notes (Signed)
[facility]  PATIENT: DAUN LAMMIE DOB: Apr 02, 1956  REFERRING DOCTOR OR PCP: Tillman Abide MD SOURCE: Patient, notes from primary care, imaging and laboratory reports, MRI images personally reviewed.  _________________________________   HISTORICAL  CHIEF COMPLAINT:  Chief Complaint  Patient presents with   Follow-up    Pt in room 10, Ken in room. Here for MS follow up, on Leflunomide. Pt said short term memory, trouble focusing, multi-tasking.  balance issues, no falls, spsams in left side. Pt said brain fog, right eye doesn't focus well. On going fatigue during the day.     HISTORY OF PRESENT ILLNESS:  Ance Redic is a 67 y.o. man with relapsing remitting MS diagnosed 09/2019 but likely present since 2007 when he had visual disturbances. Marland Kitchen   Update  01/25/2023: He is on leflunomide.  This is more affordable with the teriflunomide and theoretically is equivalent.  He denies exacerbations. He has not had any relapses recently but has progression.   He generally tolerates it well.    He is on armodafinil 200 mg po  He is having more issues with cognition.  He finds he can only do one mental task at a time and he is more forgetful. He notes word finding issues.   He has EDS.  He snores just a little bit and no OSA signs.    He is also noting more physical fatigue.  He takes armodafinilor modafinil bid  He also takes methylphenidate 20 mg twice a day  Currently, gait is doing well.  He has no falls but feels balance is slightly worse and he uses the bannister now.  He has minimal left leg weakness and mild left leg spasticity with some effect on his gait.  No current visual issues but last year he had some visual field changes.   He notes some urinary urgency but no incontinence.    For the most part, bladder is fine.   Some urinary hesitancy.  He saw neurosurgery due to the cervical and lumbar degenerative changes.  He has had lumbar fusion (Ostergard).He is  currently getting SI joint injections.   His pain has generally done better.  He takes an occasional hydrocodone when pain is more intense.   Hydrocodone has helped.  He takes 1/2 to 2 pills a day.   He will be having an MRI lumbar soon.    He is on cyclobenzaprine qHS   He has pain in his left foot pain wit a burning quality.    He continues to feel he is more emotional than he had been in the past..  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  He had > 100 ant bites and when he got a steroid he felt the foot pain was better.    MS History:      He began to experience some neurologic symptoms in 2007.  That year, he had the onset of difficulties with eye movements.  Specifically, he was having difficulty tracking smoothly.   He also had some stumbling.  Over 2 monhs, his symptoms improved.   In 2013, he fell due to a left foot drop and continues to have mild left leg weakness. Since then he also has noted that he feels disoriented, has trouble coming up with the right words and veers to the left intermittently, especially in bright fluorescent lights as in some stores.  He also has had left hand numbness and reduced coordination  for the last year.   He was diagnosed 11/05/2019 based on his history of optic neuritis, relapses with left-sided symptoms and additional changes in brain MRI and the presence of 2 foci in the cervical spine  Imaging studies: MRIs of the brain from 10/11/2005 and 10/29/2012:  The MRI show multiple T2/FLAIR hyperintense foci, at least 20, with some in the periventricular white matter and some in the juxtacortical white matter.  There was U-fiber involvement of a couple foci in the posterior left frontal lobe.  Between 2007 and 2014, 2-3 more lesions developed (the new lesions were mostly deep white matter).  We do not have any images of the cervical or thoracic spine spine so the spinal cord could not be reviewed.  MRI of the brain  10/15/2019 showed scattered T2/FLAIR hyperintense foci, some in the periventricular white matter radially oriented to the ventricles and a couple in the juxtacortical white matter..  Compared to the MRI from 2014, there were 3 or 4 new lesions.  None of the foci enhance.  MRI brain 04/29/2020 scattered T2/FLAIR hyperintense foci in the hemispheres in a pattern consistent with chronic demyelinating plaque associated with multiple sclerosis.  None of the foci appear to be acute.  They do not enhance.  Compared to the MRI dated 04/29/2020, there are no new lesions.  MRI of the cervical and thoracic spine 10/15/2019 showed 2 T2 hyperintense foci within the spinal cord, 1 laterally to the left at C4 and the other posteriorly to the left adjacent to C6-C7.  There are degenerative changes at C4-C5 and C5-C6 with potential for left C5 nerve root compression.  There is mild spinal stenosis.  The thoracic spine showed a normal spinal cord and minimal degenerative changes at a couple levels that did not lead to nerve root compression or spinal stenosis.  MRI of the lumbar spine shows a large herniated disc at L2-L3 towards the left compressing the left L3 nerve root.  Additionally there is multilevel degenerative change elsewhere but with less potential for nerve root compression.   MRI brain 12/28/2021 showed Multiple T2/FLAIR hyperintense foci in the hemispheres in a pattern consistent with chronic demyelinating plaque associated with multiple sclerosis.  None of the foci enhanced or appear to be acute.  Compared to the MRI from 02/09/2021, there were no new lesions.  MRI cervical spine 912/2023 showed  IMPRESSION: This MRI of the cervical spine with and without contrast shows the following: Two T2 hyperintense foci within the final cord, laterally to the left adjacent to C4 and posterolaterally to the left adjacent to C6-C7.  Both of these were present on the 10/15/2019 MRI and all consistent with chronic  demyelinating plaque associated with multiple sclerosis. At C4-C5, there are degenerative changes causing moderately severe left foraminal narrowing with potential for left C5 nerve root compression.  No spinal stenosis. At C5-C6, there are degenerative changes causing mild spinal stenosis and moderately severe left foraminal narrowing with potential for left C6 nerve root compression. Degenerative changes are essentially unchanged compared to the 2021 MRI. Normal enhancement pattern.  REVIEW OF SYSTEMS: Constitutional: No fevers, chills, sweats, or change in appetite Eyes: As above Ear, nose and throat: No hearing loss, ear pain, nasal congestion, sore throat Cardiovascular: No chest pain, palpitations Respiratory:  No shortness of breath at rest or with exertion.   No wheezes GastrointestinaI: No nausea, vomiting, diarrhea, abdominal pain, fecal incontinence Genitourinary:  No dysuria, urinary retention.  He has mild frequency and nocturia.  He has  a history of kidney stones. Musculoskeletal:  No neck pain, back pain Integumentary: No rash, pruritus, skin lesions Neurological: as above Psychiatric: No depression at this time.  No anxiety Endocrine: No palpitations, diaphoresis, change in appetite, change in weigh or increased thirst Hematologic/Lymphatic:  No anemia, purpura, petechiae. Allergic/Immunologic: No itchy/runny eyes, nasal congestion, recent allergic reactions, rashes  ALLERGIES: Allergies  Allergen Reactions   Citalopram Hydrobromide     REACTION: unspecified   Penicillins Other (See Comments)    REACTION: questionable    HOME MEDICATIONS:  Current Outpatient Medications:    ALPRAZolam (XANAX) 0.5 MG tablet, Take 2 tablets thirty minutes prior to MRI.  May take one additional tablet before entering scanner, if needed.  MUST HAVE DRIVER., Disp: 3 tablet, Rfl: 0   Armodafinil 200 MG TABS, Take 1 tablet (200 mg total) by mouth in the morning. One po qAM and one po  qNoon, Disp: 30 tablet, Rfl: 5   fluticasone (FLONASE) 50 MCG/ACT nasal spray, Place 2 sprays into both nostrils daily. In each nostril, Disp: 16 g, Rfl: 12   gabapentin (NEURONTIN) 100 MG capsule, TAKE ONE CAPSULE BY MOUTH EVERY MORNING AND TAKE TWO CAPSULES BY MOUTH AT BEDTIME, Disp: 90 capsule, Rfl: 5   hydrocortisone (ANUSOL-HC) 25 MG suppository, UNWRAP AND INSERT 1 SUPPOSITORY INTO THE RECTUM 3 TIMES DAILY AS NEEDED, Disp: 30 suppository, Rfl: 1   ibuprofen (ADVIL,MOTRIN) 200 MG tablet, Take 200-400 mg by mouth every 6 (six) hours as needed., Disp: , Rfl:    leflunomide (ARAVA) 20 MG tablet, TAKE 1 TABLET BY MOUTH DAILY, Disp: 90 tablet, Rfl: 0   methylphenidate (RITALIN) 20 MG tablet, Take one po AM and one po at noon, Disp: 60 tablet, Rfl: 0   omeprazole (PRILOSEC) 20 MG capsule, Take 20 mg by mouth daily., Disp: , Rfl:    cyclobenzaprine (FLEXERIL) 10 MG tablet, Take 1 tablet (10 mg total) by mouth at bedtime., Disp: 90 tablet, Rfl: 3   HYDROcodone-acetaminophen (NORCO) 7.5-325 MG tablet, 1/2 to 1 po bid prn pain, Disp: 60 tablet, Rfl: 0   predniSONE (STERAPRED UNI-PAK 21 TAB) 10 MG (21) TBPK tablet, Take by mouth daily. As directed (Patient not taking: Reported on 01/25/2023), Disp: 21 tablet, Rfl: 0  PAST MEDICAL HISTORY: Past Medical History:  Diagnosis Date   Abnormal MRI 2007   nonspecific white matter abnormalities   Allergy    seasonal   Anxiety    no meds   Arthritis    spine   GERD (gastroesophageal reflux disease)    History of kidney stones    x 2 episodes- last 2020   Hx of adenomatous colonic polyps 09/02/2021   2 diminutive adenomas recall 2030   Hyperlipidemia    no meds-? borderline   Migraines    OCULAR connected to MS   Neuromuscular disorder (HCC)    MS    PAST SURGICAL HISTORY: Past Surgical History:  Procedure Laterality Date   COLONOSCOPY  06/27/2007   Normal   CYSTOSCOPY/URETEROSCOPY/HOLMIUM LASER/STENT PLACEMENT Right 07/27/2018   Procedure:  CYSTOSCOPY/URETEROSCOPY/HOLMIUM LASER/STENT PLACEMENT;  Surgeon: Riki Altes, MD;  Location: [facility];  Service: Urology;  Laterality: Right;   LUMBAR DISC SURGERY     URETEROLITHOTOMY  11/16/2001   US ECHOCARDIOGRAPHY  11/16/2004   normal, Carotids normal    FAMILY HISTORY: Family History  Problem Relation Age of Onset   Diabetes Father    Heart disease Father    Dementia Father    Lumbar disc disease Brother  Dementia Paternal Grandmother    CAD Neg Hx    Hypertension Neg Hx    Cancer Neg Hx    Colon cancer Neg Hx    Colon polyps Neg Hx    Esophageal cancer Neg Hx    Stomach cancer Neg Hx    Rectal cancer Neg Hx     SOCIAL HISTORY:  Social History   Socioeconomic History   Marital status: Married    Spouse name: Not on file   Number of children: Not on file   Years of education: Not on file   Highest education level: Not on file  Occupational History   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, contracting and Freight forwarder: [employer]  Tobacco Use   Smoking status: Never    Passive exposure: Past   Smokeless tobacco: Never  Vaping Use   Vaping status: Never Used  Substance and Sexual Activity   Alcohol use: Yes    Alcohol/week: 0.0 standard drinks of alcohol    Comment: rare- once a month   Drug use: No   Sexual activity: Not on file  Other Topics Concern   Not on file  Social History Narrative   Lives   Caffeine use:    Long standing relationship with partner--finally able to marry   Social Determinants of Health   Financial Resource Strain: Not on file  Food Insecurity: Not on file  Transportation Needs: Not on file  Physical Activity: Not on file  Stress: Not on file  Social Connections: Not on file  Intimate Partner Violence: Not on file     PHYSICAL EXAM  Vitals:   01/25/23 0941  BP: 132/83  Pulse: 87  Weight: 176 lb 9.6 oz (80.1 kg)  Height: 5' 10.5" (1.791 m)    Body mass index is 24.98 kg/m.   General: The patient is  well-developed and well-nourished and in no acute distress  Neck:  The neck is nontender.  Skin: Extremities are without rash or  edema.  Neurologic Exam  Mental status: The patient is alert and oriented x 3 at the time of the examination. The patient has apparent normal recent and remote memory, with an apparently normal attention span and concentration ability.   Speech is normal.  Cranial nerves: Extraocular movements are full. Facial strength is fine. No dysarthria is noted.No obvious hearing deficits are noted.  Motor:  Muscle bulk is normal.   Tone is slightly increased in the left leg relative to the right. Strength is  5 / 5 in all 4 extremities except 4+/5 proximal left leg..   Sensory: Sensory testing is intact to soft touch and vibration sensation in all 4 extremities.  Coordination: Cerebellar testing reveals good finger-nose-finger and heel-to-shin bilaterally.  Gait and station: Station is normal.    The gait has a normal stride.  There is a very slight left foot drop.  His tandem gait is mildly wide.  Romberg is negative.  Reflexes: Deep tendon reflexes are symmetric and normal in arms but increased at knees, left > right (with spread) and at the ankles (though no clonus)  DIAGNOSTIC DATA (LABS, IMAGING, TESTING) - I reviewed patient records, labs, notes, testing and imaging myself where available.  Lab Results  Component Value Date   WBC 4.0 07/12/2022   HGB 14.9 07/12/2022   HCT 44.0 07/12/2022   MCV 91 07/12/2022   PLT 238 07/12/2022      Component Value Date/Time   NA 136 07/12/2022 0906   K  4.6 07/12/2022 0906   CL 99 07/12/2022 0906   CO2 24 07/12/2022 0906   GLUCOSE 79 07/12/2022 0906   GLUCOSE 76 07/07/2021 0929   BUN 9 07/12/2022 0906   CREATININE 0.95 07/12/2022 0906   CREATININE 1.13 11/21/2016 1224   CALCIUM 9.1 07/12/2022 0906   PROT 7.1 07/12/2022 0906   ALBUMIN 4.5 07/12/2022 0906   AST 19 07/12/2022 0906   ALT 13 07/12/2022 0906    ALKPHOS 102 07/12/2022 0906   BILITOT 0.4 07/12/2022 0906   GFRNONAA 83 11/04/2019 1433   GFRAA 96 11/04/2019 1433   Lab Results  Component Value Date   CHOL 248 (H) 11/21/2016   HDL 43 11/21/2016   LDLCALC 155 (H) 11/21/2016   LDLDIRECT 136.0 10/21/2014   TRIG 249 (H) 11/21/2016   CHOLHDL 5.8 (H) 11/21/2016   No results found for: "HGBA1C" Lab Results  Component Value Date   VITAMINB12 239 07/07/2021   Lab Results  Component Value Date   TSH 5.45 (H) 10/16/2013       ASSESSMENT AND PLAN  Multiple sclerosis (HCC)  High risk medication use  ADD (attention deficit disorder) without hyperactivity  Other fatigue  Lumbar disc herniation  Right optic neuritis  Brain fog   1.  Continue Leflunomide (related to teriflunomide)..   We discussed he has active SPMS and some progression is typical..   MRI shows 2-3 spinal plaques - one to the left in the corticospinal tract.  We discussed that her medication clinical trials reported good phase 3 data for secondary progressive MS.  I would consider that medication when it becomes available(tolebrutinib) 2.   Continue methylphenidate and armodafinil  3.   Continue seeing pain management for lumbar injections.  He is also seeing [facility] for his spine.   We can write the hydrocodone 4.   Stay active and exercise as tolerated 5.   If EDS worsens, consider HST .   Return to see me in 6 months or sooner if there are new or worsening neurologic symptoms.   45-minute office visit with the majority of the time spent face-to-face for history and physical, discussion/counseling and decision-making.  Additional time with record review and documentation.  This visit is part of a comprehensive longitudinal care medical relationship regarding the patients primary diagnosis of MS and related concerns.   Nickole Adamek A. Epimenio Foot, MD, [facility] 01/25/2023, 10:41 AM Certified in Neurology, Clinical Neurophysiology, Sleep Medicine and  Neuroimaging  [facility] [address] [place], [place] [address] [phone]

## 2023-02-14 ENCOUNTER — Other Ambulatory Visit: Payer: Self-pay | Admitting: Neurology

## 2023-02-14 MED ORDER — METHYLPHENIDATE HCL 20 MG PO TABS: ORAL_TABLET | ORAL | 0 refills | Status: DC

## 2023-02-14 NOTE — Telephone Encounter (Signed)
Pt is needing a refill on his  methylphenidate (RITALIN) 20 MG tablet and is needing it sent to the [facility] in [place]

## 2023-02-27 ENCOUNTER — Other Ambulatory Visit: Payer: Self-pay | Admitting: Neurology

## 2023-02-27 MED ORDER — ARMODAFINIL 200 MG PO TABS: 1.0000 | ORAL_TABLET | Freq: Every morning | ORAL | 5 refills | Status: DC

## 2023-02-27 NOTE — Telephone Encounter (Signed)
Last seen on 01/25/23 Follow up scheduled 08/07/23 I called the pharmacy to see when last Rx was filled per pharmacy last refill was 9/20/824 #30 tablets Rx pending to be signed

## 2023-02-27 NOTE — Telephone Encounter (Signed)
Pt request refill for Armodafinil 200 MG TABS send to [facility] [id number]

## 2023-02-28 NOTE — Telephone Encounter (Signed)
Rx was sent on 02/27/23 for Armodafinil  200 mg.   This request is for modafinil 200 mg. Per note on 01/25/23 " Continue methylphenidate and armodafinil "   Rx will be need to be denied.

## 2023-03-01 ENCOUNTER — Other Ambulatory Visit: Payer: Self-pay | Admitting: Neurology

## 2023-03-01 ENCOUNTER — Other Ambulatory Visit: Payer: Self-pay

## 2023-03-01 ENCOUNTER — Encounter: Payer: Self-pay | Admitting: Neurology

## 2023-03-01 MED ORDER — MODAFINIL 200 MG PO TABS: 200.0000 mg | ORAL_TABLET | Freq: Every day | ORAL | 1 refills | Status: DC

## 2023-03-01 NOTE — Telephone Encounter (Signed)
Dr.Sater it appears Rx was d/c'd at visit on 01/25/23 by you. Patient said he would still like a Rx for Modafanil. He reports taking both modafanil in the morning and Armodafanil at noon.     Are you okay with providing him with Rx for this? I have modafanil 200 mg pending approval.  Please advise

## 2023-03-01 NOTE — Telephone Encounter (Signed)
Last seen on 01/25/23 Follow up scheduled on 08/07/23  I just want to confirm patient will continue gabapentin? I couldn't find it mentioned in recent office visit.   Rx pending for approval or denial

## 2023-03-14 ENCOUNTER — Other Ambulatory Visit: Payer: Self-pay | Admitting: Neurology

## 2023-03-14 MED ORDER — METHYLPHENIDATE HCL 20 MG PO TABS: ORAL_TABLET | ORAL | 0 refills | Status: DC

## 2023-03-14 NOTE — Telephone Encounter (Signed)
Last seen on 01/25/23 Follow up scheduled on 08/07/23 Last filled on 02/14/23 #60 (30 tablets) Rx pending to be signed

## 2023-03-14 NOTE — Telephone Encounter (Signed)
Pt request refill for methylphenidate (RITALIN) 20 MG tablet send to [facility] [id number]

## 2023-03-30 ENCOUNTER — Other Ambulatory Visit: Payer: Self-pay | Admitting: Neurology

## 2023-03-30 DIAGNOSIS — G35 Multiple sclerosis: Secondary | ICD-10-CM

## 2023-03-30 NOTE — Telephone Encounter (Signed)
Last seen on 01/25/23 Follow up scheduled on 08/07/23 Rx sent

## 2023-04-06 ENCOUNTER — Other Ambulatory Visit: Payer: Self-pay

## 2023-04-06 ENCOUNTER — Encounter: Payer: Self-pay | Admitting: Neurology

## 2023-04-06 MED ORDER — METHYLPHENIDATE HCL 20 MG PO TABS: ORAL_TABLET | ORAL | 0 refills | Status: DC

## 2023-04-06 NOTE — Telephone Encounter (Signed)
Requested Prescriptions   Pending Prescriptions Disp Refills   methylphenidate (RITALIN) 20 MG tablet 60 tablet 0    Sig: Take one po AM and one po at noon   Last seen 01/25/23 Next appt 08/07/23 Dispenses    Dispensed Days Supply Quantity Provider Pharmacy  methylphenidate 20 mg tablet 03/18/2023 30 60 tablet Sater, Pearletha Furl, MD [facility]...  methylphenidate 20 mg tablet 02/14/2023 30 60 tablet Sater, Pearletha Furl, MD [facility]...  methylphenidate 20 mg tablet 01/16/2023 30 60 tablet Sater, Pearletha Furl, MD [facility]...

## 2023-04-07 ENCOUNTER — Other Ambulatory Visit: Payer: Self-pay | Admitting: Internal Medicine

## 2023-04-27 ENCOUNTER — Other Ambulatory Visit: Payer: Self-pay | Admitting: Neurology

## 2023-04-27 MED ORDER — HYDROCODONE-ACETAMINOPHEN 7.5-325 MG PO TABS: 0.5000 | ORAL_TABLET | Freq: Two times a day (BID) | ORAL | 0 refills | Status: DC | PRN

## 2023-04-27 NOTE — Telephone Encounter (Signed)
 Last seen on 01/25/23 Follow up scheduled on 08/07/23 Last filled on 01/25/23 #17 tablets (7 day supply) Rx pending to be signed

## 2023-04-27 NOTE — Telephone Encounter (Signed)
 Pt called requesting a refill on his  HYDROcodone-acetaminophen (NORCO) 7.5-325 MG tablet and needing it sent to the [facility] in [place]

## 2023-05-12 ENCOUNTER — Encounter: Payer: Self-pay | Admitting: Neurology

## 2023-05-15 ENCOUNTER — Telehealth: Payer: Self-pay

## 2023-05-15 NOTE — Telephone Encounter (Signed)
Called pt per Dr. Epimenio Foot to schedule him to come in to be seen due to worsening symptoms,offered pt Wed 05/17/2023 at 9:30am but pt stated that he is having a back ablation done that day at 7:30am and won't know how he is going to feel. Then offered pt an appointment for 05/24/2023 @ 9:30am, pt took that appointment. Pt is scheduled for Appointment with Dr. Epimenio Foot.

## 2023-05-18 ENCOUNTER — Other Ambulatory Visit: Payer: Self-pay | Admitting: Neurology

## 2023-05-18 MED ORDER — METHYLPHENIDATE HCL 20 MG PO TABS: ORAL_TABLET | ORAL | 0 refills | Status: DC

## 2023-05-18 NOTE — Telephone Encounter (Signed)
Pt request refill for methylphenidate (RITALIN) 20 MG tablet send to [facility] [id number]

## 2023-05-18 NOTE — Telephone Encounter (Signed)
Last seen 01/25/23 and next f/u 05/24/23. Last refilled 04/15/23 #60.

## 2023-05-24 ENCOUNTER — Encounter: Payer: Self-pay | Admitting: Neurology

## 2023-05-24 ENCOUNTER — Ambulatory Visit: Payer: Medicare PPO | Admitting: Neurology

## 2023-05-24 VITALS — BP 137/87 | HR 64 | Ht 70.0 in | Wt 179.0 lb

## 2023-05-24 DIAGNOSIS — H469 Unspecified optic neuritis: Secondary | ICD-10-CM

## 2023-05-24 DIAGNOSIS — Z79899 Other long term (current) drug therapy: Secondary | ICD-10-CM

## 2023-05-24 DIAGNOSIS — G35 Multiple sclerosis: Secondary | ICD-10-CM

## 2023-05-24 DIAGNOSIS — M5126 Other intervertebral disc displacement, lumbar region: Secondary | ICD-10-CM

## 2023-05-24 DIAGNOSIS — R5383 Other fatigue: Secondary | ICD-10-CM | POA: Diagnosis not present

## 2023-05-24 MED ORDER — GABAPENTIN 300 MG PO CAPS: ORAL_CAPSULE | ORAL | 5 refills | Status: AC

## 2023-05-24 NOTE — Progress Notes (Signed)
 [facility]  PATIENT: Cameron [name] DOB: [birth year]  REFERRING DOCTOR OR PCP: Charlie Denise MD SOURCE: Patient, notes from primary care, imaging and laboratory reports, MRI images personally reviewed.  _________________________________   HISTORICAL  CHIEF COMPLAINT:  Chief Complaint  Patient presents with   Eye Problem    Rm 10 alone Pt is well, reports he is having double vision and trouble with vocusing. Has upcoming appt with Ophthalmology in Feb.     HISTORY OF PRESENT ILLNESS:  Cameron [name] is a 68 y.o. man with relapsing remitting MS diagnosed 09/2019 but likely present since 2007 when he had visual disturbances. SABRA   Update  05/24/2023 He has had more LBP and was found to have scoliosis and facet hypertrophy.  He had RFA at [facility].    He has had lumbar surgery (Ostergard).  He takes 1/2 hydrocodone  most nights.  He is on gabapentin  100 mg po and 300 mg   He has had more right eye issues.  He has a h.o ON in that eye ad also has a cataract.  Currently, OS is blurry and vison seems grayed out.   He also has dry eyes.   He sees ophthalmologist  He is on leflunomide .  This is more affordable with the teriflunomide and theoretically is equivalent.  He denies exacerbations. He has not had any relapses recently but has progression.   He generally tolerates it well.    He is on armodafinil  200 mg po  He is having some issues with cognition.  He finds he can only do one mental task at a time and he is more forgetful. He notes word finding issues.   He has EDS.  He snores just a little bit and no OSA signs.   He is noting more fatigue. Armodafinil  with Ritalin  helps some.   He takes Ritalin  q730AM and q1030am and armodafinil  q1030am  Currently, gait is doing well.  He has no falls but feels balance is slightly worse and he uses the bannister now.  He has minimal left leg weakness and mild left leg spasticity with some effect on his gait.  No current visual issues  but last year he had some visual field changes.   He notes some urinary urgency but no incontinence.    For the most part, bladder is fine.   Some urinary hesitancy.  He has pain in his left foot pain wit a burning quality.    He continues to feel he is more emotional than he had been in the past..  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      MS History:      He began to experience some neurologic symptoms in 2007.  That year, he had the onset of difficulties with eye movements.  Specifically, he was having difficulty tracking smoothly.   He also had some stumbling.  Over 2 monhs, his symptoms improved.   In 2013, he fell due to a left foot drop and continues to have mild left leg weakness. Since then he also has noted that he feels disoriented, has trouble coming up with the right words and veers to the left intermittently, especially in bright fluorescent lights as in some stores.  He also has had left hand numbness and reduced coordination for the last year.   He was diagnosed 11/05/2019 based on his history of optic neuritis, relapses with left-sided symptoms and additional changes in brain MRI  and the presence of 2 foci in the cervical spine  Imaging studies: MRIs of the brain from 10/11/2005 and 10/29/2012:  The MRI show multiple T2/FLAIR hyperintense foci, at least 20, with some in the periventricular white matter and some in the juxtacortical white matter.  There was U-fiber involvement of a couple foci in the posterior left frontal lobe.  Between 2007 and 2014, 2-3 more lesions developed (the new lesions were mostly deep white matter).  We do not have any images of the cervical or thoracic spine spine so the spinal cord could not be reviewed.  MRI of the brain 10/15/2019 showed scattered T2/FLAIR hyperintense foci, some in the periventricular white matter radially oriented to the ventricles and a couple in the juxtacortical white matter..  Compared to  the MRI from 2014, there were 3 or 4 new lesions.  None of the foci enhance.  MRI brain 04/29/2020 scattered T2/FLAIR hyperintense foci in the hemispheres in a pattern consistent with chronic demyelinating plaque associated with multiple sclerosis.  None of the foci appear to be acute.  They do not enhance.  Compared to the MRI dated 04/29/2020, there are no new lesions.  MRI of the cervical and thoracic spine 10/15/2019 showed 2 T2 hyperintense foci within the spinal cord, 1 laterally to the left at C4 and the other posteriorly to the left adjacent to C6-C7.  There are degenerative changes at C4-C5 and C5-C6 with potential for left C5 nerve root compression.  There is mild spinal stenosis.  The thoracic spine showed a normal spinal cord and minimal degenerative changes at a couple levels that did not lead to nerve root compression or spinal stenosis.  MRI of the lumbar spine shows a large herniated disc at L2-L3 towards the left compressing the left L3 nerve root.  Additionally there is multilevel degenerative change elsewhere but with less potential for nerve root compression.   MRI brain 12/28/2021 showed Multiple T2/FLAIR hyperintense foci in the hemispheres in a pattern consistent with chronic demyelinating plaque associated with multiple sclerosis.  None of the foci enhanced or appear to be acute.  Compared to the MRI from 02/09/2021, there were no new lesions.  MRI cervical spine 912/2023 showed  IMPRESSION: This MRI of the cervical spine with and without contrast shows the following: Two T2 hyperintense foci within the final cord, laterally to the left adjacent to C4 and posterolaterally to the left adjacent to C6-C7.  Both of these were present on the 10/15/2019 MRI and all consistent with chronic demyelinating plaque associated with multiple sclerosis. At C4-C5, there are degenerative changes causing moderately severe left foraminal narrowing with potential for left C5 nerve root compression.  No  spinal stenosis. At C5-C6, there are degenerative changes causing mild spinal stenosis and moderately severe left foraminal narrowing with potential for left C6 nerve root compression. Degenerative changes are essentially unchanged compared to the 2021 MRI. Normal enhancement pattern.  REVIEW OF SYSTEMS: Constitutional: No fevers, chills, sweats, or change in appetite Eyes: As above Ear, nose and throat: No hearing loss, ear pain, nasal congestion, sore throat Cardiovascular: No chest pain, palpitations Respiratory:  No shortness of breath at rest or with exertion.   No wheezes GastrointestinaI: No nausea, vomiting, diarrhea, abdominal pain, fecal incontinence Genitourinary:  No dysuria, urinary retention.  He has mild frequency and nocturia.  He has a history of kidney stones. Musculoskeletal:  No neck pain, back pain Integumentary: No rash, pruritus, skin lesions Neurological: as above Psychiatric: No depression at this time.  No anxiety Endocrine: No palpitations, diaphoresis, change in appetite, change in weigh or increased thirst Hematologic/Lymphatic:  No anemia, purpura, petechiae. Allergic/Immunologic: No itchy/runny eyes, nasal congestion, recent allergic reactions, rashes  ALLERGIES: Allergies  Allergen Reactions   Citalopram Hydrobromide     REACTION: unspecified   Penicillins Other (See Comments)    REACTION: questionable    HOME MEDICATIONS:  Current Outpatient Medications:    ALPRAZolam  (XANAX ) 0.5 MG tablet, Take 2 tablets thirty minutes prior to MRI.  May take one additional tablet before entering scanner, if needed.  MUST HAVE DRIVER., Disp: 3 tablet, Rfl: 0   Armodafinil  200 MG TABS, Take 1 tablet (200 mg total) by mouth in the morning., Disp: 30 tablet, Rfl: 5   cyclobenzaprine  (FLEXERIL ) 10 MG tablet, Take 1 tablet (10 mg total) by mouth at bedtime., Disp: 90 tablet, Rfl: 3   fluticasone  (FLONASE ) 50 MCG/ACT nasal spray, Place 2 sprays into both nostrils daily.  In each nostril, Disp: 16 g, Rfl: 12   HYDROcodone -acetaminophen  (NORCO) 7.5-325 MG tablet, 1/2 to 1 po bid prn pain, Disp: 60 tablet, Rfl: 0   HYDROcodone -acetaminophen  (NORCO) 7.5-325 MG tablet, Take 0.5-1 tablets by mouth 2 (two) times daily as needed for moderate pain (pain score 4-6)., Disp: 60 tablet, Rfl: 0   hydrocortisone  (ANUCORT-HC ) 25 MG suppository, UNWRAP AND INSERT 1 SUPPOSITORY INTO THE RECTUM THREE TIMES A DAY AS NEEDED, Disp: 15 suppository, Rfl: 11   ibuprofen  (ADVIL ,MOTRIN ) 200 MG tablet, Take 200-400 mg by mouth every 6 (six) hours as needed., Disp: , Rfl:    leflunomide  (ARAVA ) 20 MG tablet, TAKE 1 TABLET BY MOUTH DAILY, Disp: 90 tablet, Rfl: 1   methylphenidate  (RITALIN ) 20 MG tablet, Take one po AM and one po at noon, Disp: 60 tablet, Rfl: 0   omeprazole (PRILOSEC) 20 MG capsule, Take 20 mg by mouth daily., Disp: , Rfl:    predniSONE  (STERAPRED UNI-PAK 21 TAB) 10 MG (21) TBPK tablet, Take by mouth daily. As directed, Disp: 21 tablet, Rfl: 0   gabapentin  (NEURONTIN ) 300 MG capsule, One po tid, Disp: 90 capsule, Rfl: 5  PAST MEDICAL HISTORY: Past Medical History:  Diagnosis Date   Abnormal MRI 2007   nonspecific white matter abnormalities   Allergy    seasonal   Anxiety    no meds   Arthritis    spine   GERD (gastroesophageal reflux disease)    History of kidney stones    x 2 episodes- last 2020   Hx of adenomatous colonic polyps 09/02/2021   2 diminutive adenomas recall 2030   Hyperlipidemia    no meds-? borderline   Migraines    OCULAR connected to MS   Neuromuscular disorder (HCC)    MS    PAST SURGICAL HISTORY: Past Surgical History:  Procedure Laterality Date   COLONOSCOPY  06/27/2007   Normal   CYSTOSCOPY/URETEROSCOPY/HOLMIUM LASER/STENT PLACEMENT Right 07/27/2018   Procedure: CYSTOSCOPY/URETEROSCOPY/HOLMIUM LASER/STENT PLACEMENT;  Surgeon: Twylla Glendia BROCKS, MD;  Location: [facility] ORS;  Service: Urology;  Laterality: Right;   LUMBAR DISC SURGERY      URETEROLITHOTOMY  11/16/2001   US  ECHOCARDIOGRAPHY  11/16/2004   normal, Carotids normal    FAMILY HISTORY: Family History  Problem Relation Age of Onset   Diabetes Father    Heart disease Father    Dementia Father    Lumbar disc disease Brother    Dementia Paternal Grandmother    CAD Neg Hx    Hypertension Neg Hx    Cancer Neg  Hx    Colon cancer Neg Hx    Colon polyps Neg Hx    Esophageal cancer Neg Hx    Stomach cancer Neg Hx    Rectal cancer Neg Hx     SOCIAL HISTORY:  Social History   Socioeconomic History   Marital status: Married    Spouse name: Not on file   Number of children: Not on file   Years of education: Not on file   Highest education level: Not on file  Occupational History   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, contracting and Freight Forwarder: [employer]  Tobacco Use   Smoking status: Never    Passive exposure: Past   Smokeless tobacco: Never  Vaping Use   Vaping status: Never Used  Substance and Sexual Activity   Alcohol use: Yes    Alcohol/week: 0.0 standard drinks of alcohol    Comment: rare- once a month   Drug use: No   Sexual activity: Not on file  Other Topics Concern   Not on file  Social History Narrative   Lives   Caffeine use:    Long standing relationship with partner--finally able to marry   Social Drivers of Health   Financial Resource Strain: Not on file  Food Insecurity: Not on file  Transportation Needs: Not on file  Physical Activity: Not on file  Stress: Not on file  Social Connections: Not on file  Intimate Partner Violence: Not on file     PHYSICAL EXAM  Vitals:   05/24/23 0920 05/24/23 0927  BP: (!) 135/90 137/87  Pulse: 64 64  Weight: 179 lb (81.2 kg)   Height: 5' 10 (1.778 m)     Body mass index is 25.68 kg/m.  Vision Screening   Right eye Left eye Both eyes  Without correction 0 20/50 20/70  With correction 20/200 20/30 20/30  Comments: Pt was having double vision  in R eye       General: The patient is well-developed and well-nourished and in no acute distress  Neck:  The neck is nontender.  Skin: Extremities are without rash or  edema.  Neurologic Exam  Mental status: The patient is alert and oriented x 3 at the time of the examination. The patient has apparent normal recent and remote memory, with an apparently normal attention span and concentration ability.   Speech is normal.  Cranial nerves: Extraocular movements are full. Facial strength is fine. No dysarthria is noted.No obvious hearing deficits are noted.  Motor:  Muscle bulk is normal.   Tone is slightly increased in the left leg relative to the right. Strength is  5 / 5 in all 4 extremities except 4+/5 proximal left leg..   Sensory: Sensory testing is intact to soft touch and vibration sensation in all 4 extremities.  Coordination: Cerebellar testing reveals good finger-nose-finger and heel-to-shin bilaterally.  Gait and station: Station is normal.    The gait has a normal stride. He hads a mild left foot drop.  His tandem gait is mildly wide.  Romberg is negative.  Reflexes: Deep tendon reflexes are symmetric and normal in arms but increased at knees, left > right (with spread) and at the ankles (though no clonus)  DIAGNOSTIC DATA (LABS, IMAGING, TESTING) - I reviewed patient records, labs, notes, testing and imaging myself where available.  Lab Results  Component Value Date   WBC 4.0 07/12/2022   HGB 14.9 07/12/2022   HCT 44.0 07/12/2022   MCV 91 07/12/2022  PLT 238 07/12/2022      Component Value Date/Time   NA 136 07/12/2022 0906   K 4.6 07/12/2022 0906   CL 99 07/12/2022 0906   CO2 24 07/12/2022 0906   GLUCOSE 79 07/12/2022 0906   GLUCOSE 76 07/07/2021 0929   BUN 9 07/12/2022 0906   CREATININE 0.95 07/12/2022 0906   CREATININE 1.13 11/21/2016 1224   CALCIUM 9.1 07/12/2022 0906   PROT 7.1 07/12/2022 0906   ALBUMIN 4.5 07/12/2022 0906   AST 19 07/12/2022 0906   ALT 13  07/12/2022 0906   ALKPHOS 102 07/12/2022 0906   BILITOT 0.4 07/12/2022 0906   GFRNONAA 83 11/04/2019 1433   GFRAA 96 11/04/2019 1433   Lab Results  Component Value Date   CHOL 248 (H) 11/21/2016   HDL 43 11/21/2016   LDLCALC 155 (H) 11/21/2016   LDLDIRECT 136.0 10/21/2014   TRIG 249 (H) 11/21/2016   CHOLHDL 5.8 (H) 11/21/2016   No results found for: HGBA1C Lab Results  Component Value Date   VITAMINB12 239 07/07/2021   Lab Results  Component Value Date   TSH 5.45 (H) 10/16/2013       ASSESSMENT AND PLAN  Multiple sclerosis (HCC) - Plan: CBC with Differential/Platelet, Comprehensive metabolic panel  High risk medication use - Plan: CBC with Differential/Platelet, Comprehensive metabolic panel  Other fatigue  Lumbar disc herniation  Right optic neuritis   1.  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   MRI shows 2-3 spinal plaques - one to the left in the corticospinal tract.    2.   Continue methylphenidate  and armodafinil   3.   Continue seeing pain management for lumbar injections as needed.   If worsens return to [facility].  Low dose hydrocodoe as needed 4.   Stay active and exercise as tolerated 5.    Return to see me in 6 months or sooner if there are new or worsening neurologic symptoms.   This visit is part of a comprehensive longitudinal care medical relationship regarding the patients primary diagnosis of MS and related concerns.   Emerson Schreifels A. Vear, MD, Teola RENO 05/24/2023, 10:09 AM Certified in Neurology, Clinical Neurophysiology, Sleep Medicine and Neuroimaging  [facility] [address], [address] [place], [place] [address] [phone]

## 2023-05-25 ENCOUNTER — Encounter: Payer: Self-pay | Admitting: Neurology

## 2023-05-25 LAB — CBC WITH DIFFERENTIAL/PLATELET
Basophils Absolute: 0.1 10*3/uL (ref 0.0–0.2)
Basos: 1 %
EOS (ABSOLUTE): 0.1 10*3/uL (ref 0.0–0.4)
Eos: 1 %
Hematocrit: 42.2 % (ref 37.5–51.0)
Hemoglobin: 14.7 g/dL (ref 13.0–17.7)
Immature Grans (Abs): 0 10*3/uL (ref 0.0–0.1)
Immature Granulocytes: 0 %
Lymphocytes Absolute: 1.4 10*3/uL (ref 0.7–3.1)
Lymphs: 33 %
MCH: 31.7 pg (ref 26.6–33.0)
MCHC: 34.8 g/dL (ref 31.5–35.7)
MCV: 91 fL (ref 79–97)
Monocytes Absolute: 0.5 10*3/uL (ref 0.1–0.9)
Monocytes: 11 %
Neutrophils Absolute: 2.2 10*3/uL (ref 1.4–7.0)
Neutrophils: 54 %
Platelets: 214 10*3/uL (ref 150–450)
RBC: 4.64 x10E6/uL (ref 4.14–5.80)
RDW: 11.9 % (ref 11.6–15.4)
WBC: 4.2 10*3/uL (ref 3.4–10.8)

## 2023-05-25 LAB — COMPREHENSIVE METABOLIC PANEL
ALT: 13 [IU]/L (ref 0–44)
AST: 22 [IU]/L (ref 0–40)
Albumin: 4.6 g/dL (ref 3.9–4.9)
Alkaline Phosphatase: 111 [IU]/L (ref 44–121)
BUN/Creatinine Ratio: 12 (ref 10–24)
BUN: 11 mg/dL (ref 8–27)
Bilirubin Total: 0.2 mg/dL (ref 0.0–1.2)
CO2: 20 mmol/L (ref 20–29)
Calcium: 9.1 mg/dL (ref 8.6–10.2)
Chloride: 101 mmol/L (ref 96–106)
Creatinine, Ser: 0.9 mg/dL (ref 0.76–1.27)
Globulin, Total: 2.6 g/dL (ref 1.5–4.5)
Glucose: 85 mg/dL (ref 70–99)
Potassium: 4.6 mmol/L (ref 3.5–5.2)
Sodium: 137 mmol/L (ref 134–144)
Total Protein: 7.2 g/dL (ref 6.0–8.5)
eGFR: 94 mL/min/{1.73_m2} (ref >59)

## 2023-06-06 ENCOUNTER — Ambulatory Visit: Payer: Medicare PPO | Admitting: Internal Medicine

## 2023-06-06 ENCOUNTER — Encounter: Payer: Self-pay | Admitting: Internal Medicine

## 2023-06-06 VITALS — BP 114/76 | HR 96 | Temp 97.6°F | Ht 70.0 in | Wt 179.0 lb

## 2023-06-06 DIAGNOSIS — R6881 Early satiety: Secondary | ICD-10-CM | POA: Insufficient documentation

## 2023-06-06 NOTE — Progress Notes (Signed)
 Subjective:    Patient ID: Cameron [name], male    DOB: 30-Jun-1955, 68 y.o.   MRN: [id number]  HPI Here due to trouble eating  Has discomfort right after eating Has had to decrease portion Avoiding meals since it makes him feel bad Gets bloating---and mucus that he relates to reflux Fatigued after eating Doesn't feel like MS  Goes back 8-9 months Hasn't lost weight though No fever Queasy at times--but no vomiting  Feels gassy and belches--but not what he seems to be able to pass Flatulence but delayed  No recent med changes No sugarless candies, etc---but some diet soda. Tea with regular sugar  Husband did check his sugars --always normal  Current Outpatient Medications on File Prior to Visit  Medication Sig Dispense Refill   ALPRAZolam (XANAX) 0.5 MG tablet Take 2 tablets thirty minutes prior to MRI.  May take one additional tablet before entering scanner, if needed.  MUST HAVE DRIVER. 3 tablet 0   Armodafinil 200 MG TABS Take 1 tablet (200 mg total) by mouth in the morning. 30 tablet 5   cyclobenzaprine (FLEXERIL) 10 MG tablet Take 1 tablet (10 mg total) by mouth at bedtime. 90 tablet 3   fluticasone (FLONASE) 50 MCG/ACT nasal spray Place 2 sprays into both nostrils daily. In each nostril 16 g 12   gabapentin (NEURONTIN) 300 MG capsule One po tid 90 capsule 5   HYDROcodone-acetaminophen (NORCO) 7.5-325 MG tablet Take 0.5-1 tablets by mouth 2 (two) times daily as needed for moderate pain (pain score 4-6). 60 tablet 0   hydrocortisone (ANUCORT-HC) 25 MG suppository UNWRAP AND INSERT 1 SUPPOSITORY INTO THE RECTUM THREE TIMES A DAY AS NEEDED 15 suppository 11   ibuprofen (ADVIL,MOTRIN) 200 MG tablet Take 200-400 mg by mouth every 6 (six) hours as needed.     leflunomide (ARAVA) 20 MG tablet TAKE 1 TABLET BY MOUTH DAILY 90 tablet 1   methylphenidate (RITALIN) 20 MG tablet Take one po AM and one po at noon 60 tablet 0   omeprazole (PRILOSEC) 20 MG capsule Take 20 mg by mouth  daily.     predniSONE (STERAPRED UNI-PAK 21 TAB) 10 MG (21) TBPK tablet Take by mouth daily. As directed (Patient not taking: Reported on 06/06/2023) 21 tablet 0   No current facility-administered medications on file prior to visit.    Allergies  Allergen Reactions   Citalopram Hydrobromide     REACTION: unspecified   Penicillins Other (See Comments)    REACTION: questionable    Past Medical History:  Diagnosis Date   Abnormal MRI 2007   nonspecific white matter abnormalities   Allergy    seasonal   Anxiety    no meds   Arthritis    spine   GERD (gastroesophageal reflux disease)    History of kidney stones    x 2 episodes- last 2020   Hx of adenomatous colonic polyps 09/02/2021   2 diminutive adenomas recall 2030   Hyperlipidemia    no meds-? borderline   Migraines    OCULAR connected to MS   Neuromuscular disorder [facility])    MS    Past Surgical History:  Procedure Laterality Date   COLONOSCOPY  06/27/2007   Normal   CYSTOSCOPY/URETEROSCOPY/HOLMIUM LASER/STENT PLACEMENT Right 07/27/2018   Procedure: CYSTOSCOPY/URETEROSCOPY/HOLMIUM LASER/STENT PLACEMENT;  Surgeon: Riki Altes, MD;  Location: [facility] ORS;  Service: Urology;  Laterality: Right;   LUMBAR DISC SURGERY     URETEROLITHOTOMY  11/16/2001   US ECHOCARDIOGRAPHY  11/16/2004  normal, Carotids normal    Family History  Problem Relation Age of Onset   Diabetes Father    Heart disease Father    Dementia Father    Lumbar disc disease Brother    Dementia Paternal Grandmother    CAD Neg Hx    Hypertension Neg Hx    Cancer Neg Hx    Colon cancer Neg Hx    Colon polyps Neg Hx    Esophageal cancer Neg Hx    Stomach cancer Neg Hx    Rectal cancer Neg Hx     Social History   Socioeconomic History   Marital status: Married    Spouse name: Not on file   Number of children: Not on file   Years of education: Not on file   Highest education level: Not on file  Occupational History   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: [employer]  Tobacco Use   Smoking status: Never    Passive exposure: Past   Smokeless tobacco: Never  Vaping Use   Vaping status: Never Used  Substance and Sexual Activity   Alcohol use: Yes    Alcohol/week: 0.0 standard drinks of alcohol    Comment: rare- once a month   Drug use: No   Sexual activity: Not on file  Other Topics Concern   Not on file  Social History Narrative   Lives   Caffeine use:    Long standing relationship with partner--finally able to marry   Social Drivers of Corporate investment banker Strain: Not on file  Food Insecurity: Not on file  Transportation Needs: Not on file  Physical Activity: Not on file  Stress: Not on file  Social Connections: Not on file  Intimate Partner Violence: Not on file   Review of Systems Bowels move okay---using align and high fiber diet Eats apples also---not clearly worsening things      Physical Exam Constitutional:      Appearance: Normal appearance.  Cardiovascular:     Rate and Rhythm: Normal rate and regular rhythm.     Heart sounds: No murmur heard.    No gallop.  Pulmonary:     Effort: Pulmonary effort is normal.     Breath sounds: Normal breath sounds. No wheezing or rales.  Abdominal:     General: Bowel sounds are normal. There is no distension.     Palpations: Abdomen is soft.     Tenderness: There is no abdominal tenderness. There is no guarding or rebound.  Musculoskeletal:     Cervical back: Neck supple.     Right lower leg: No edema.     Left lower leg: No edema.  Lymphadenopathy:     Cervical: No cervical adenopathy.  Neurological:     Mental Status: He is alert.            Assessment & Plan:

## 2023-06-06 NOTE — Assessment & Plan Note (Signed)
 Going back many months Symptoms sound like some degree of gastroparesis--but no diabetes and this would not be expected with his MS No new meds--though several can cause GI side effects Gave info on low FODMAP diet Will ask Dr Leone Payor to see him

## 2023-06-08 ENCOUNTER — Telehealth: Payer: Self-pay

## 2023-06-08 NOTE — Telephone Encounter (Signed)
 Confirmed appointment with patient & he has been provided address.

## 2023-06-08 NOTE — Telephone Encounter (Signed)
 Left message for patient to call back. Tentatively scheduled OV for tomorrow at 8:50 am with Dr. Leone Payor. Need patient to call back and confirm.

## 2023-06-08 NOTE — Telephone Encounter (Signed)
 Iva Boop, MD  Karie Schwalbe, MD Cc: Lucky Cowboy, RN We will get him in  [place] - please set him up w/ first available me vs APP

## 2023-06-08 NOTE — Telephone Encounter (Signed)
 Sent pt mychart message as well.

## 2023-06-09 ENCOUNTER — Encounter: Payer: Self-pay | Admitting: Internal Medicine

## 2023-06-09 ENCOUNTER — Ambulatory Visit: Payer: Medicare PPO | Admitting: Internal Medicine

## 2023-06-09 VITALS — BP 128/72 | HR 50 | Ht 70.5 in | Wt 181.0 lb

## 2023-06-09 DIAGNOSIS — R1013 Epigastric pain: Secondary | ICD-10-CM

## 2023-06-09 DIAGNOSIS — R6881 Early satiety: Secondary | ICD-10-CM | POA: Diagnosis not present

## 2023-06-09 NOTE — Progress Notes (Signed)
 MICAL KICKLIGHTER 68 y.o. 09/05/1955 [id number]  Assessment & Plan:   Encounter Diagnoses  Name Primary?   Early satiety Yes   Dyspepsia    Evaluate with EGD.  Question H. pylori, functional dyspepsia.  Malignancy is always in the differential with this but no weight loss so seems unlikely.  Question relationship to multiple sclerosis.  The risks and benefits as well as alternatives of endoscopic procedure(s) have been discussed and reviewed. All questions answered. The patient agrees to proceed.  CC: Karie Schwalbe, MD    Subjective:    Chief Complaint: Early satiety   Discussed the use of AI scribe software for clinical note transcription with the patient, who gave verbal consent to proceed.  History of Present Illness   Cameron ISA "Annette Stable" is a 68 year old male with multiple sclerosis who presents with early satiety and bloating. He was referred by Dr. Alphonsus Sias for evaluation of stomach issues.  He has been experiencing early satiety for the past eight months, with symptoms worsening over the last four months. He can only consume small amounts of food before feeling full and experiences significant bloating and nausea, leading to a reduction in food intake due to fear of feeling sick. There is a significant increase in fatigue following meals. No issues with swallowing or significant bowel irregularities are reported.  He describes experiencing 'reflux mucus' at night, characterized by thick, hard mucus that is difficult to clear from his vocal folds. No burning sensation or sinus issues are present. He has been on omeprazole for approximately a year to manage reflux symptoms, having previously used Prilosec and Nexium for heartburn and GERD.  He has a history of multiple sclerosis, diagnosed in 2021, with symptoms dating back to 2000. His current medications for MS include leflunomide, armodafinil for fatigue, and methylphenidate for focus issues. Bowel movements are more  regular due to careful monitoring and the use of fiber and Align, a probiotic, although the benefits of Align are marginal.  He underwent a spinal ablation about three weeks ago for scoliosis-related nerve pinching at L1-L3, which has provided relief.  He recently retired as a Engineer, water activities at [employer] and has gained ten pounds post-retirement. He has a history of anxiety, previously treated with medication, but is not currently on any anti-anxiety drugs. He acknowledges some anxiety due to recent life changes but not to the extent previously experienced.      No dysphagia, bowel habit changes signs of bleeding from the GI tract reported.   Lab Results  Component Value Date   NA 137 05/24/2023   CL 101 05/24/2023   K 4.6 05/24/2023   CO2 20 05/24/2023   BUN 11 05/24/2023   CREATININE 0.90 05/24/2023   EGFR 94 05/24/2023   CALCIUM 9.1 05/24/2023   PHOS 3.0 07/07/2021   ALBUMIN 4.6 05/24/2023   GLUCOSE 85 05/24/2023   Lab Results  Component Value Date   ALT 13 05/24/2023   AST 22 05/24/2023   ALKPHOS 111 05/24/2023   BILITOT 0.2 05/24/2023   Lab Results  Component Value Date   WBC 4.2 05/24/2023   HGB 14.7 05/24/2023   HCT 42.2 05/24/2023   MCV 91 05/24/2023   PLT 214 05/24/2023    Colonoscopy 09/02/21 - Two diminutive polyps ADENOMAS in the sigmoid colon and in the transverse colon, removed with a cold snare. Resected and retrieved. - Diverticulosis in the sigmoid colon, in the descending colon and in the transverse colon. -  External and internal hemorrhoids. - The examination was otherwise normal on direct and retroflexion views. Recall 2030   Allergies  Allergen Reactions   Citalopram Hydrobromide     REACTION: unspecified   Penicillins Other (See Comments)    REACTION: questionable   Current Meds  Medication Sig   ALPRAZolam (XANAX) 0.5 MG tablet Take 2 tablets thirty minutes prior to MRI.  May take one additional tablet before entering scanner, if  needed.  MUST HAVE DRIVER.   Armodafinil 200 MG TABS Take 1 tablet (200 mg total) by mouth in the morning.   cyclobenzaprine (FLEXERIL) 10 MG tablet Take 1 tablet (10 mg total) by mouth at bedtime.   fluticasone (FLONASE) 50 MCG/ACT nasal spray Place 2 sprays into both nostrils daily. In each nostril   gabapentin (NEURONTIN) 300 MG capsule One po tid   HYDROcodone-acetaminophen (NORCO) 7.5-325 MG tablet Take 0.5-1 tablets by mouth 2 (two) times daily as needed for moderate pain (pain score 4-6).   hydrocortisone (ANUCORT-HC) 25 MG suppository UNWRAP AND INSERT 1 SUPPOSITORY INTO THE RECTUM THREE TIMES A DAY AS NEEDED   ibuprofen (ADVIL,MOTRIN) 200 MG tablet Take 200-400 mg by mouth every 6 (six) hours as needed.   leflunomide (ARAVA) 20 MG tablet TAKE 1 TABLET BY MOUTH DAILY   methylphenidate (RITALIN) 20 MG tablet Take one po AM and one po at noon   omeprazole (PRILOSEC) 20 MG capsule Take 20 mg by mouth daily.   Past Medical History:  Diagnosis Date   Abnormal MRI 2007   nonspecific white matter abnormalities   Allergy    seasonal   Anxiety    no meds   Arthritis    spine   GERD (gastroesophageal reflux disease)    History of kidney stones    x 2 episodes- last 2020   Hx of adenomatous colonic polyps 09/02/2021   2 diminutive adenomas recall 2030   Hyperlipidemia    no meds-? borderline   Migraines    OCULAR connected to MS   Neuromuscular disorder [facility])    MS   Past Surgical History:  Procedure Laterality Date   COLONOSCOPY  06/27/2007   Normal   CYSTOSCOPY/URETEROSCOPY/HOLMIUM LASER/STENT PLACEMENT Right 07/27/2018   Procedure: CYSTOSCOPY/URETEROSCOPY/HOLMIUM LASER/STENT PLACEMENT;  Surgeon: Riki Altes, MD;  Location: [facility] ORS;  Service: Urology;  Laterality: Right;   LUMBAR DISC SURGERY     URETEROLITHOTOMY  11/16/2001   US ECHOCARDIOGRAPHY  11/16/2004   normal, Carotids normal   Social History   Social History Narrative   Lives   Caffeine use:    Long  standing relationship with partner--finally able to marry   family history includes Dementia in his father and paternal grandmother; Diabetes in his father; Heart disease in his father; Lumbar disc disease in his brother.   Review of Systems As per HPI otherwise negative  Objective:   Physical Exam @BP  128/72   Pulse (!) 50   Ht 5' 10.5" (1.791 m)   Wt 181 lb (82.1 kg)   SpO2 95%   BMI 25.60 kg/m @  General:  Well-developed, well-nourished and in no acute distress Eyes:  anicteric. ENT:   Mouth and posterior pharynx free of lesions.  Neck:   supple w/o thyromegaly or mass.  Lungs: Clear to auscultation bilaterally. Heart:  S1S2, no rubs, murmurs, gallops. Abdomen:  soft, non-tender, no hepatosplenomegaly, hernia, or mass and BS+.  Lymph:  no cervical or supraclavicular adenopathy. Neuro:  A&O x 3.  Psych:  appropriate mood and  Affect.   Data Reviewed: As per HPI

## 2023-06-09 NOTE — Patient Instructions (Signed)
 You have been scheduled for an endoscopy. Please follow written instructions given to you at your visit today.  If you use inhalers (even only as needed), please bring them with you on the day of your procedure.  If you take any of the following medications, they will need to be adjusted prior to your procedure:   DO NOT TAKE 7 DAYS PRIOR TO TEST- Trulicity (dulaglutide) Ozempic, Wegovy (semaglutide) Mounjaro (tirzepatide) Bydureon Bcise (exanatide extended release)  DO NOT TAKE 1 DAY PRIOR TO YOUR TEST Rybelsus (semaglutide) Adlyxin (lixisenatide) Victoza (liraglutide) Byetta (exanatide) ___________________________________________________________________________    _______________________________________________________  If your blood pressure at your visit was 140/90 or greater, please contact your primary care physician to follow up on this.  _______________________________________________________  If you are age 96 or older, your body mass index should be between 23-30. Your Body mass index is 25.6 kg/m. If this is out of the aforementioned range listed, please consider follow up with your Primary Care Provider.  If you are age 91 or younger, your body mass index should be between 19-25. Your Body mass index is 25.6 kg/m. If this is out of the aformentioned range listed, please consider follow up with your Primary Care Provider.   ________________________________________________________  The [facility] providers would like to encourage you to use [facility] to communicate with providers for non-urgent requests or questions.  Due to long hold times on the telephone, sending your provider a message by [facility] may be a faster and more efficient way to get a response.  Please allow 48 business hours for a response.  Please remember that this is for non-urgent requests.  _______________________________________________________  Thank you for choosing me and [facility].  Dr.Carl Leone Payor

## 2023-06-19 ENCOUNTER — Other Ambulatory Visit: Payer: Self-pay | Admitting: Neurology

## 2023-06-19 MED ORDER — METHYLPHENIDATE HCL 20 MG PO TABS
ORAL_TABLET | ORAL | 0 refills | Status: DC
Start: 2023-06-19 — End: 2023-07-19

## 2023-06-19 NOTE — Telephone Encounter (Signed)
 Pt request refill for methylphenidate (RITALIN) 20 MG tablet send to [facility] [phone]

## 2023-06-19 NOTE — Telephone Encounter (Signed)
 Last seen 05/24/23, next appt scheduled 12/12/23 Dispenses   Dispensed Days Supply Quantity Provider Pharmacy  methylphenidate 20 mg tablet 05/18/2023 30 60 tablet Sater, Pearletha Furl, MD [facility]...  methylphenidate 20 mg tablet 04/15/2023 30 60 tablet Sater, Pearletha Furl, MD [facility]...  methylphenidate 20 mg tablet 03/18/2023 30 60 tablet Sater, Pearletha Furl, MD [facility]...  methylphenidate 20 mg tablet 02/14/2023 30 60 tablet Sater, Pearletha Furl, MD [facility]...  methylphenidate 20 mg tablet 01/16/2023 30 60 tablet Sater, Pearletha Furl, MD [facility]...

## 2023-06-20 ENCOUNTER — Encounter: Payer: Self-pay | Admitting: Ophthalmology

## 2023-06-26 ENCOUNTER — Encounter: Payer: Medicare PPO | Admitting: Internal Medicine

## 2023-06-26 NOTE — Discharge Instructions (Signed)

## 2023-06-28 ENCOUNTER — Ambulatory Visit: Payer: Self-pay | Admitting: Anesthesiology

## 2023-06-28 ENCOUNTER — Encounter
Admission: RE | Disposition: A | Discharge status: Home / Self Care | Payer: Self-pay | Source: Home / Self Care | Attending: Ophthalmology

## 2023-06-28 ENCOUNTER — Other Ambulatory Visit: Payer: Self-pay

## 2023-06-28 ENCOUNTER — Ambulatory Visit
Admission: RE | Admit: 2023-06-28 | Discharge: 2023-06-28 | Disposition: A | Discharge status: Home / Self Care | Payer: Medicare PPO | Attending: Ophthalmology | Admitting: Ophthalmology

## 2023-06-28 ENCOUNTER — Encounter: Payer: Self-pay | Admitting: Ophthalmology

## 2023-06-28 DIAGNOSIS — K219 Gastro-esophageal reflux disease without esophagitis: Secondary | ICD-10-CM | POA: Insufficient documentation

## 2023-06-28 DIAGNOSIS — M1990 Unspecified osteoarthritis, unspecified site: Secondary | ICD-10-CM | POA: Diagnosis not present

## 2023-06-28 DIAGNOSIS — Z79899 Other long term (current) drug therapy: Secondary | ICD-10-CM | POA: Insufficient documentation

## 2023-06-28 DIAGNOSIS — F419 Anxiety disorder, unspecified: Secondary | ICD-10-CM | POA: Diagnosis not present

## 2023-06-28 DIAGNOSIS — H2511 Age-related nuclear cataract, right eye: Secondary | ICD-10-CM | POA: Insufficient documentation

## 2023-06-28 DIAGNOSIS — G35 Multiple sclerosis: Secondary | ICD-10-CM | POA: Insufficient documentation

## 2023-06-28 DIAGNOSIS — R519 Headache, unspecified: Secondary | ICD-10-CM | POA: Insufficient documentation

## 2023-06-28 HISTORY — PX: CATARACT EXTRACTION W/PHACO: SHX586

## 2023-06-28 HISTORY — DX: Dizziness and giddiness: R42

## 2023-06-28 HISTORY — DX: Other idiopathic scoliosis, site unspecified: M41.20

## 2023-06-28 HISTORY — DX: Multiple sclerosis: G35

## 2023-06-28 SURGERY — PHACOEMULSIFICATION, CATARACT, WITH IOL INSERTION
Anesthesia: Monitor Anesthesia Care | Site: Eye | Laterality: Right

## 2023-06-28 MED ORDER — TETRACAINE HCL 0.5 % OP SOLN
OPHTHALMIC | Status: DC | PRN
  Administered 2023-06-28: 1 [drp] via OPHTHALMIC

## 2023-06-28 MED ORDER — FENTANYL CITRATE (PF) 100 MCG/2ML IJ SOLN
INTRAMUSCULAR | Status: DC | PRN
  Administered 2023-06-28 (×2): 50 ug via INTRAVENOUS

## 2023-06-28 MED ORDER — TETRACAINE HCL 0.5 % OP SOLN
1.0000 [drp] | OPHTHALMIC | Status: DC | PRN
  Administered 2023-06-28 (×3): 1 [drp] via OPHTHALMIC

## 2023-06-28 MED ORDER — CEFUROXIME OPHTHALMIC INJECTION 1 MG/0.1 ML
INJECTION | OPHTHALMIC | Status: DC | PRN
  Administered 2023-06-28: .1 mL via INTRACAMERAL

## 2023-06-28 MED ORDER — MIDAZOLAM HCL 2 MG/2ML IJ SOLN
INTRAMUSCULAR | Status: AC
  Filled 2023-06-28: qty 2

## 2023-06-28 MED ORDER — FENTANYL CITRATE (PF) 100 MCG/2ML IJ SOLN
INTRAMUSCULAR | Status: AC
  Filled 2023-06-28: qty 2

## 2023-06-28 MED ORDER — TETRACAINE HCL 0.5 % OP SOLN
OPHTHALMIC | Status: AC
  Filled 2023-06-28: qty 4

## 2023-06-28 MED ORDER — ONDANSETRON HCL 4 MG/2ML IJ SOLN
INTRAMUSCULAR | Status: AC
  Filled 2023-06-28: qty 2

## 2023-06-28 MED ORDER — BRIMONIDINE TARTRATE-TIMOLOL 0.2-0.5 % OP SOLN
OPHTHALMIC | Status: DC | PRN
  Administered 2023-06-28: 1 [drp] via OPHTHALMIC

## 2023-06-28 MED ORDER — ONDANSETRON HCL 4 MG/2ML IJ SOLN
4.0000 mg | Freq: Once | INTRAMUSCULAR | Status: AC
  Administered 2023-06-28: 4 mg via INTRAVENOUS

## 2023-06-28 MED ORDER — SIGHTPATH DOSE#1 BSS IO SOLN
INTRAOCULAR | Status: DC | PRN
  Administered 2023-06-28: 67 mL via OPHTHALMIC

## 2023-06-28 MED ORDER — ARMC OPHTHALMIC DILATING DROPS
1.0000 | OPHTHALMIC | Status: DC | PRN
  Administered 2023-06-28 (×3): 1 via OPHTHALMIC

## 2023-06-28 MED ORDER — SIGHTPATH DOSE#1 NA HYALUR & NA CHOND-NA HYALUR IO KIT
PACK | INTRAOCULAR | Status: DC | PRN
  Administered 2023-06-28: 1 via OPHTHALMIC

## 2023-06-28 MED ORDER — LIDOCAINE HCL (PF) 2 % IJ SOLN
INTRAOCULAR | Status: DC | PRN
  Administered 2023-06-28: 1 mL via INTRAMUSCULAR

## 2023-06-28 MED ORDER — MIDAZOLAM HCL 2 MG/2ML IJ SOLN
INTRAMUSCULAR | Status: DC | PRN
  Administered 2023-06-28 (×2): 1 mg via INTRAVENOUS

## 2023-06-28 MED ORDER — SIGHTPATH DOSE#1 BSS IO SOLN
INTRAOCULAR | Status: DC | PRN
  Administered 2023-06-28: 15 mL

## 2023-06-28 MED ORDER — ARMC OPHTHALMIC DILATING DROPS
OPHTHALMIC | Status: AC
  Filled 2023-06-28: qty 0.5

## 2023-06-28 SURGICAL SUPPLY — 10 items
CATARACT SUITE SIGHTPATH (MISCELLANEOUS) ×1 IMPLANT
FEE CATARACT SUITE SIGHTPATH (MISCELLANEOUS) ×1 IMPLANT
GLOVE BIOGEL PI IND STRL 8 (GLOVE) ×1 IMPLANT
GLOVE SURG LX STRL 7.5 STRW (GLOVE) ×1 IMPLANT
GLOVE SURG PROTEXIS BL SZ6.5 (GLOVE) ×1 IMPLANT
GLOVE SURG SYN 6.5 PF PI BL (GLOVE) ×1 IMPLANT
LENS IOL TECNIS EYHANCE 19.5 (Intraocular Lens) IMPLANT
NDL FILTER BLUNT 18X1 1/2 (NEEDLE) ×1 IMPLANT
NEEDLE FILTER BLUNT 18X1 1/2 (NEEDLE) ×1 IMPLANT
SYR 3ML LL SCALE MARK (SYRINGE) ×1 IMPLANT

## 2023-06-28 NOTE — Anesthesia Preprocedure Evaluation (Addendum)
 Anesthesia Evaluation  Patient identified by MRN, date of birth, ID band Patient awake    Reviewed: Allergy & Precautions, H&P , NPO status , Patient's Chart, lab work & pertinent test results  Airway Mallampati: III  TM Distance: <3 FB Neck ROM: Full    Dental no notable dental hx.    Pulmonary neg pulmonary ROS   Pulmonary exam normal breath sounds clear to auscultation       Cardiovascular negative cardio ROS Normal cardiovascular exam Rhythm:Regular Rate:Normal     Neuro/Psych  Headaches PSYCHIATRIC DISORDERS Anxiety      Neuromuscular disease negative neurological ROS  negative psych ROS   GI/Hepatic negative GI ROS, Neg liver ROS,GERD  ,,  Endo/Other  negative endocrine ROS    Renal/GU Renal diseasenegative Renal ROS  negative genitourinary   Musculoskeletal negative musculoskeletal ROS (+) Arthritis ,    Abdominal   Peds negative pediatric ROS (+)  Hematology negative hematology ROS (+)   Anesthesia Other Findings   Allergy  Hyperlipidemia Abnormal MRI  GERD (gastroesophageal reflux disease) History of kidney stones Migraines Anxiety  .Neuromuscular disorder (HCC) Arthritis  Hx of adenomatous colonic polyps Vertigo  Multiple sclerosis (HCC) no longer relapsing remitting, has gone to progressive, but "atypical" so has full movement but spasticity on left leg Idiopathic scoliosis      Reproductive/Obstetrics negative OB ROS                             Anesthesia Physical Anesthesia Plan  ASA: 2  Anesthesia Plan: MAC   Post-op Pain Management:    Induction: Intravenous  PONV Risk Score and Plan:   Airway Management Planned: Natural Airway and Nasal Cannula  Additional Equipment:   Intra-op Plan:   Post-operative Plan:   Informed Consent: I have reviewed the patients History and Physical, chart, labs and discussed the procedure including the risks, benefits  and alternatives for the proposed anesthesia with the patient or authorized representative who has indicated his/her understanding and acceptance.     Dental Advisory Given  Plan Discussed with: Anesthesiologist, CRNA and Surgeon  Anesthesia Plan Comments: (Patient consented for risks of anesthesia including but not limited to:  - adverse reactions to medications - damage to eyes, teeth, lips or other oral mucosa - nerve damage due to positioning  - sore throat or hoarseness - Damage to heart, brain, nerves, lungs, other parts of body or loss of life  Patient voiced understanding and assent.)       Anesthesia Quick Evaluation

## 2023-06-28 NOTE — H&P (Signed)
 [facility]   Primary Care Physician:  Karie Schwalbe, MD Ophthalmologist: Dr. Lockie Mola  Pre-Procedure History & Physical: HPI:  Cameron [name] is a 68 y.o. male here for ophthalmic surgery.   Past Medical History:  Diagnosis Date   Abnormal MRI 2007   nonspecific white matter abnormalities   Allergy    seasonal   Anxiety    no meds   Arthritis    spine   GERD (gastroesophageal reflux disease)    History of kidney stones    x 2 episodes- last 2020   Hx of adenomatous colonic polyps 09/02/2021   2 diminutive adenomas recall 2030   Hyperlipidemia    no meds-? borderline   Idiopathic scoliosis    Migraines    OCULAR connected to MS   Multiple sclerosis (HCC)    Neuromuscular disorder (HCC)    MS - mild left side spasicity   Vertigo    1 episode.  approx 12/2021.  Lasted 2-3 days.    Past Surgical History:  Procedure Laterality Date   COLONOSCOPY  06/27/2007   Normal   CYSTOSCOPY/URETEROSCOPY/HOLMIUM LASER/STENT PLACEMENT Right 07/27/2018   Procedure: CYSTOSCOPY/URETEROSCOPY/HOLMIUM LASER/STENT PLACEMENT;  Surgeon: Riki Altes, MD;  Location: [facility] ORS;  Service: Urology;  Laterality: Right;   LUMBAR DISC SURGERY     URETEROLITHOTOMY  11/16/2001   US ECHOCARDIOGRAPHY  11/16/2004   normal, Carotids normal    Prior to Admission medications   Medication Sig Start Date End Date Taking? Authorizing Provider  ALPRAZolam Prudy Feeler) 0.5 MG tablet Take 2 tablets thirty minutes prior to MRI.  May take one additional tablet before entering scanner, if needed.  MUST HAVE DRIVER. 11/17/93  Yes Sater, Pearletha Furl, MD  Armodafinil 200 MG TABS Take 1 tablet (200 mg total) by mouth in the morning. 02/27/23  Yes Sater, Pearletha Furl, MD  Cholecalciferol (DIALYVITE VITAMIN D 5000 PO) Take by mouth daily.   Yes [provider]  cyclobenzaprine (FLEXERIL) 10 MG tablet Take 1 tablet (10 mg total) by mouth at bedtime. 01/25/23  Yes Sater, Pearletha Furl, MD  fluticasone  (FLONASE) 50 MCG/ACT nasal spray Place 2 sprays into both nostrils daily. In each nostril 11/18/15  Yes Karie Schwalbe, MD  gabapentin (NEURONTIN) 300 MG capsule One po tid 05/24/23  Yes Sater, Pearletha Furl, MD  HYDROcodone-acetaminophen (NORCO) 7.5-325 MG tablet Take 0.5-1 tablets by mouth 2 (two) times daily as needed for moderate pain (pain score 4-6). 04/27/23  Yes Sater, Pearletha Furl, MD  hydrocortisone (ANUCORT-HC) 25 MG suppository UNWRAP AND INSERT 1 SUPPOSITORY INTO THE RECTUM THREE TIMES A DAY AS NEEDED 04/07/23  Yes Tillman Abide I, MD  ibuprofen (ADVIL,MOTRIN) 200 MG tablet Take 200-400 mg by mouth every 6 (six) hours as needed.   Yes [provider]  leflunomide (ARAVA) 20 MG tablet TAKE 1 TABLET BY MOUTH DAILY 03/30/23  Yes Sater, Pearletha Furl, MD  methylphenidate (RITALIN) 20 MG tablet Take one po AM and one po at noon 06/19/23  Yes Huston Foley, MD  omeprazole (PRILOSEC) 20 MG capsule Take 20 mg by mouth daily.   Yes [provider]    Allergies as of 06/01/2023 - Review Complete 05/24/2023  Allergen Reaction Noted   Citalopram hydrobromide  04/28/2006   Penicillins Other (See Comments) 04/28/2006    Family History  Problem Relation Age of Onset   Diabetes Father    Heart disease Father    Dementia Father    Lumbar disc disease Brother  Dementia Paternal Grandmother    CAD Neg Hx    Hypertension Neg Hx    Cancer Neg Hx    Colon cancer Neg Hx    Colon polyps Neg Hx    Esophageal cancer Neg Hx    Stomach cancer Neg Hx    Rectal cancer Neg Hx     Social History   Socioeconomic History   Marital status: Married    Spouse name: Not on file   Number of children: Not on file   Years of education: Not on file   Highest education level: Not on file  Occupational History   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, contracting and Freight forwarder: [employer]  Tobacco Use   Smoking status: Never    Passive exposure: Past   Smokeless tobacco: Never  Vaping Use    Vaping status: Never Used  Substance and Sexual Activity   Alcohol use: Yes    Alcohol/week: 0.0 standard drinks of alcohol    Comment: rare- once a month   Drug use: No   Sexual activity: Not on file  Other Topics Concern   Not on file  Social History Narrative   Lives   Caffeine use:    Long standing relationship with partner--finally able to marry   Social Drivers of Corporate investment banker Strain: Not on file  Food Insecurity: Not on file  Transportation Needs: Not on file  Physical Activity: Not on file  Stress: Not on file  Social Connections: Not on file  Intimate Partner Violence: Not on file    Review of Systems: See HPI, otherwise negative ROS  Physical Exam: BP (!) 144/88   Temp (!) 97.2 F (36.2 C) (Temporal)   Ht 5' 10.51" (1.791 m)   Wt 80.1 kg   SpO2 100%   BMI 24.96 kg/m  General:   Alert,  pleasant and cooperative in NAD Head:  Normocephalic and atraumatic. Lungs:  Clear to auscultation.    Heart:  Regular rate and rhythm.   Impression/Plan: Cameron [name] is here for ophthalmic surgery.  Risks, benefits, limitations, and alternatives regarding ophthalmic surgery have been reviewed with the patient.  Questions have been answered.  All parties agreeable.   Lockie Mola, MD  06/28/2023, 8:02 AM

## 2023-06-28 NOTE — Transfer of Care (Signed)
 Immediate Anesthesia Transfer of Care Note  Patient: Cameron [name]  Procedure(s) Performed: CATARACT EXTRACTION PHACO AND INTRAOCULAR LENS PLACEMENT (IOC) RIGHT  6.17  00:42.8 (Right: Eye)  Patient Location: PACU  Anesthesia Type: MAC  Level of Consciousness: awake, alert  and patient cooperative  Airway and Oxygen Therapy: Patient Spontanous Breathing and Patient connected to supplemental oxygen  Post-op Assessment: Post-op Vital signs reviewed, Patient's Cardiovascular Status Stable, Respiratory Function Stable, Patent Airway and No signs of Nausea or vomiting  Post-op Vital Signs: Reviewed and stable  Complications: No notable events documented.

## 2023-06-28 NOTE — Anesthesia Postprocedure Evaluation (Signed)
 Anesthesia Post Note  Patient: Cameron [name]  Procedure(s) Performed: CATARACT EXTRACTION PHACO AND INTRAOCULAR LENS PLACEMENT (IOC) RIGHT  6.17  00:42.8 (Right: Eye)  Patient location during evaluation: PACU Anesthesia Type: MAC Level of consciousness: awake and alert Pain management: pain level controlled Vital Signs Assessment: post-procedure vital signs reviewed and stable Respiratory status: spontaneous breathing, nonlabored ventilation, respiratory function stable and patient connected to nasal cannula oxygen Cardiovascular status: stable and blood pressure returned to baseline Postop Assessment: no apparent nausea or vomiting Anesthetic complications: no   No notable events documented.   Last Vitals:  Vitals:   06/28/23 0905 06/28/23 0910  BP: (!) 139/97 (!) 121/96  Pulse: 80 78  Resp: 11 (!) 4  Temp: 36.8 C   SpO2: 98% 98%    Last Pain:  Vitals:   06/28/23 0910  TempSrc:   PainSc: 0-No pain                 Marisue Humble

## 2023-06-28 NOTE — Op Note (Signed)
 LOCATION:  [facility]   PREOPERATIVE DIAGNOSIS:    Nuclear sclerotic cataract right eye. H25.11   POSTOPERATIVE DIAGNOSIS:  Nuclear sclerotic cataract right eye.     PROCEDURE:  Phacoemusification with posterior chamber intraocular lens placement of the right eye   ULTRASOUND TIME: Procedure(s): CATARACT EXTRACTION PHACO AND INTRAOCULAR LENS PLACEMENT (IOC) RIGHT  6.17  00:42.8 (Right)  LENS:   Implant Name Type Inv. Item Serial No. Manufacturer Lot No. LRB No. Used Action  LENS IOL TECNIS EYHANCE 19.5 - [id number] Intraocular Lens LENS IOL TECNIS EYHANCE 19.5 [id number] [employer]  Right 1 Implanted         SURGEON:  Deirdre Evener, MD   ANESTHESIA:  Topical with tetracaine drops and 2% Xylocaine jelly, augmented with 1% preservative-free intracameral lidocaine.    COMPLICATIONS:  None.   DESCRIPTION OF PROCEDURE:  The patient was identified in the holding room and transported to the operating room and placed in the supine position under the operating microscope.  The right eye was identified as the operative eye and it was prepped and draped in the usual sterile ophthalmic fashion.   A 1 millimeter clear-corneal paracentesis was made at the 12:00 position.  0.5 ml of preservative-free 1% lidocaine was injected into the anterior chamber. The anterior chamber was filled with Viscoat viscoelastic.  A 2.4 millimeter keratome was used to make a near-clear corneal incision at the 9:00 position.  A curvilinear capsulorrhexis was made with a cystotome and capsulorrhexis forceps.  Balanced salt solution was used to hydrodissect and hydrodelineate the nucleus.   Phacoemulsification was then used in stop and chop fashion to remove the lens nucleus and epinucleus.  The remaining cortex was then removed using the irrigation and aspiration handpiece. Provisc was then placed into the capsular bag to distend it for lens placement.  A lens was then injected into the capsular bag.   The remaining viscoelastic was aspirated.   Wounds were hydrated with balanced salt solution.  The anterior chamber was inflated to a physiologic pressure with balanced salt solution.  No wound leaks were noted. Cefuroxime 0.1 ml of a 10mg /ml solution was injected into the anterior chamber for a dose of 1 mg of intracameral antibiotic at the completion of the case.   Timolol and Brimonidine drops were applied to the eye.  The patient was taken to the recovery room in stable condition without complications of anesthesia or surgery.   Shiryl Ruddy 06/28/2023, 9:02 AM

## 2023-06-29 ENCOUNTER — Encounter: Payer: Self-pay | Admitting: Ophthalmology

## 2023-07-05 ENCOUNTER — Encounter: Payer: Self-pay | Admitting: Internal Medicine

## 2023-07-05 ENCOUNTER — Ambulatory Visit (AMBULATORY_SURGERY_CENTER)

## 2023-07-05 VITALS — Ht 70.5 in | Wt 175.0 lb

## 2023-07-05 DIAGNOSIS — R1013 Epigastric pain: Secondary | ICD-10-CM

## 2023-07-05 DIAGNOSIS — R6881 Early satiety: Secondary | ICD-10-CM

## 2023-07-05 NOTE — Progress Notes (Signed)

## 2023-07-05 NOTE — Addendum Note (Signed)
 Addended by: Jaquelyn Bitter on: 07/05/2023 11:05 AM   Modules accepted: Orders

## 2023-07-11 ENCOUNTER — Other Ambulatory Visit: Payer: Self-pay

## 2023-07-11 ENCOUNTER — Ambulatory Visit: Attending: Surgery | Admitting: Physical Therapy

## 2023-07-11 DIAGNOSIS — M25512 Pain in left shoulder: Secondary | ICD-10-CM | POA: Insufficient documentation

## 2023-07-11 DIAGNOSIS — G8929 Other chronic pain: Secondary | ICD-10-CM | POA: Diagnosis present

## 2023-07-11 DIAGNOSIS — M25552 Pain in left hip: Secondary | ICD-10-CM | POA: Diagnosis present

## 2023-07-11 DIAGNOSIS — M25551 Pain in right hip: Secondary | ICD-10-CM | POA: Diagnosis present

## 2023-07-11 DIAGNOSIS — M5459 Other low back pain: Secondary | ICD-10-CM | POA: Insufficient documentation

## 2023-07-11 DIAGNOSIS — R2689 Other abnormalities of gait and mobility: Secondary | ICD-10-CM | POA: Diagnosis present

## 2023-07-11 DIAGNOSIS — M6281 Muscle weakness (generalized): Secondary | ICD-10-CM | POA: Diagnosis present

## 2023-07-11 DIAGNOSIS — R293 Abnormal posture: Secondary | ICD-10-CM | POA: Diagnosis present

## 2023-07-11 NOTE — Therapy (Unsigned)
 OUTPATIENT PHYSICAL THERAPY THORACOLUMBAR PROGRESS NOTE   Patient Name: Cameron [name] MRN: [id number] DOB:03-10-1956, 68 y.o., male Today's Date: 07/12/2023  END OF SESSION:  PT End of Session - 07/12/23 1015     Visit Number 2    Number of Visits 16    Date for PT Re-Evaluation 09/05/23    Authorization Type [employer]    PT Start Time 0845    PT Stop Time 0930    PT Time Calculation (min) 45 min    Activity Tolerance Patient tolerated treatment well    Behavior During Therapy [facility] for tasks assessed/performed              Past Medical History:  Diagnosis Date   Abnormal MRI 2007   nonspecific white matter abnormalities   Allergy    seasonal   Anxiety    no meds   Arthritis    spine   Cataract    GERD (gastroesophageal reflux disease)    History of kidney stones    x 2 episodes- last 2020   Hx of adenomatous colonic polyps 09/02/2021   2 diminutive adenomas recall 2030   Idiopathic scoliosis    Migraines    OCULAR connected to MS   Multiple sclerosis (HCC)    Neuromuscular disorder (HCC)    MS - mild left side spasicity   Vertigo    1 episode.  approx 12/2021.  Lasted 2-3 days.   Past Surgical History:  Procedure Laterality Date   CATARACT EXTRACTION W/PHACO Right 06/28/2023   Procedure: CATARACT EXTRACTION PHACO AND INTRAOCULAR LENS PLACEMENT (IOC) RIGHT  6.17  00:42.8;  Surgeon: Lockie Mola, MD;  Location: [facility];  Service: Ophthalmology;  Laterality: Right;   COLONOSCOPY  06/27/2007   Normal   CYSTOSCOPY/URETEROSCOPY/HOLMIUM LASER/STENT PLACEMENT Right 07/27/2018   Procedure: CYSTOSCOPY/URETEROSCOPY/HOLMIUM LASER/STENT PLACEMENT;  Surgeon: Riki Altes, MD;  Location: [facility];  Service: Urology;  Laterality: Right;   LUMBAR DISC SURGERY     URETEROLITHOTOMY  11/16/2001   US ECHOCARDIOGRAPHY  11/16/2004   normal, Carotids normal   Patient Active Problem List   Diagnosis Date Noted   Early satiety 06/06/2023    Prostate hypertrophy 07/12/2022   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   Hx of adenomatous colonic polyps 09/02/2021   Right optic neuritis 02/04/2021   Lumbar radiculopathy 06/25/2020   Other fatigue 06/25/2020   Lumbar disc herniation 05/01/2020   High risk medication use 11/04/2019   Vitamin D deficiency 11/04/2019   Left foot drop 11/04/2019   Vision disturbance 11/04/2019   Abnormal MRI of head 09/24/2019   Ureteral calculus 07/25/2018   Hydronephrosis with urinary obstruction due to ureteral calculus 07/25/2018   Sensorineural hearing loss (SNHL), bilateral 03/06/2017   Low back pain 05/29/2014   Routine general medical examination at a health care facility 02/23/2012   GERD (gastroesophageal reflux disease)    Hyperlipemia 10/03/2007   Seasonal allergic rhinitis due to pollen 07/19/2006   RENAL CALCULUS 07/19/2006   Multiple sclerosis (HCC) 10/16/2005    PCP: Karie Schwalbe, MD  REFERRING PROVIDER: Clovis Riley, PA-C  REFERRING DIAG: [phone] (ICD-10-CM) - Spondylosis without myelopathy or radiculopathy, lumbar region  Rationale for Evaluation and Treatment: Rehabilitation  THERAPY DIAG:  Muscle weakness (generalized)  Pain in right hip  Other low back pain  Pain in left hip  Chronic left shoulder pain  Abnormal posture  Other abnormalities of gait and mobility  ONSET DATE: Chronic; at least 2022  SUBJECTIVE:                                                                                                                                                                                           SUBJECTIVE STATEMENT: Good day for me so far. Low back is a little twitchy coming in.  PERTINENT HISTORY:  L2-L3 ablation, discectomy, MS  PAIN:  Are you having pain? Yes: NPRS scale: currently 3-4 Pain location: low back and lateral hips Pain description: constant, radiating to front of legs Aggravating factors: walking  ~2 miles Relieving factors: heat, lidocaine patch, pain medication     PRECAUTIONS: None  RED FLAGS: None   WEIGHT BEARING RESTRICTIONS: No  FALLS:  Has patient fallen in last 6 months? No  LIVING ENVIRONMENT: Lives with: lives with their family, husband Lives in: House/apartment Stairs: No Has following equipment at home: None  OCCUPATION: Programmer, multimedia  PLOF: Independent  PATIENT GOALS: "I would love to be able to do a moderate workout every day and stretching" (Not sure if the stretching he's doing is helping or hurting). "I want to be more active and feel secure with activity" Wants to learn more about energy conservation for his MS, decrease pain levels in general  NEXT MD VISIT: n/a  OBJECTIVE:  Note: Objective measures were completed at Evaluation unless otherwise noted.  DIAGNOSTIC FINDINGS:  11/29/22 Brain MRI IMPRESSION: This MRI of the brain with and without contrast shows the following: Multiple T2/FLAIR hyperintense foci in the cerebral hemispheres in a pattern consistent with chronic demyelinating plaque associated with multiple sclerosis.  None of the foci enhanced or appear to be acute.  Compared to the MRI from 12/28/2021, there were no new lesions. No acute findings.  Normal enhancement pattern.  PATIENT SURVEYS:  Modified Oswestry Low Back Pain Disability Questionnaire: 20 / 50 = 40.0 %  COGNITION: Overall cognitive status: Within functional limits for tasks assessed     SENSATION: L finger N/T; "drop foot" on L with neuropathy  MUSCLE LENGTH: Did not assess on eval Figure 4 stretch is tighter on R hip in seated vs L hip  POSTURE: rounded shoulders and forward head  PALPATION: TTP L>R with trigger points: UT, mid/low trap, infraspinatus, lats/teres minor and major  TTP L>R glute med and hamstrings with trigger points, R>L QL with trigger points. Did not seem particularly tender with SIJ PAs  LUMBAR ROM:   AROM eval  Flexion 75%  Extension 25%  (felt good)  Right lateral flexion 50%*  Left lateral flexion 30%  Right rotation 75%  Left rotation 75%   (Blank rows = not tested, * = pain)  UPPER EXTREMITY MMT: most painful when asked to place his hands behind his head and reach behind him to check functional external rotation ROM  MMT Right eval Left eval  Shoulder flexion 5 5  Shoulder extension (prone) 5 4  Shoulder abduction 5 5  Shoulder adduction    Shoulder internal rotation 5 5  Shoulder external rotation (sitting) 5 3+  Middle trap (prone) 5 4-  Low trap (prone) 4 3-  Elbow flexion 5 5  Elbow extension 5 5   (Blank rows = not tested)   LOWER EXTREMITY MMT:    MMT Right eval Left eval  Hip flexion 4 3+  Hip extension 4 3+  Hip abduction 4 3+  Hip adduction    Hip internal rotation    Hip external rotation    Knee flexion 4 4-  Knee extension 5 5  Ankle dorsiflexion 5 4  Ankle plantarflexion    Ankle inversion    Ankle eversion     (Blank rows = not tested)  LUMBAR SPECIAL TESTS:  Did not assess  FUNCTIONAL TESTS:  Plank for core strength TBA  GAIT: Distance walked: Into clinic Assistive device utilized: None Level of assistance: Complete Independence  TREATMENT DATE:  07/12/23:Pt arrives for aquatic physical therapy. Treatment took place in 3.5-5.5 feet of water. Water temperature was 91 degrees F. Pt entered the pool via independently with mild use of rails. Pt requires buoyancy of water for support and to offload joints with strengthening exercises.  Pt utilizes viscosity of the water required for strengthening. Seated water bench with 75% submersion Pt performed seated LE AROM exercises 20x in all planes, concurrent discussion/education on water principles and how to use them as well as pt's current status and goals. 75% depth water walking 4x in each direction. Heel lifts 10x. Hip flex/ext 10x Bil and abduction Bil 10x. VC for control Static marching min asst of pool wall for balance 10x Bil.  Seated decompression with large noodle behind patient. PTA providing min asst for trunk control.  07/11/23                                                                                                                              See HEP below   PATIENT EDUCATION:  Education details: Exam findings, POC, initial HEP, aquatics, dry needling Person educated: Patient Education method: Explanation, Demonstration, and Handouts Education comprehension: verbalized understanding, returned demonstration, and needs further education  HOME EXERCISE PROGRAM: Access Code: [email or web address] URL: [email or web address] Date: 07/11/2023 Prepared by: Vernon Prey April Kirstie Peri  Exercises - Child's Pose with Sidebending  - 1 x daily - 7 x weekly - 2 sets - 30 sec hold - Seated Piriformis Stretch  - 1 x daily - 7 x weekly - 2 sets - 30 sec hold - Seated Figure 4 Piriformis Stretch  - 1 x daily - 7 x weekly - 2 sets - 30 sec hold - Supine Figure 4  -  1 x daily - 7 x weekly - 2 sets - 30 sec hold - Supine Piriformis Stretch with Foot on Ground  - 1 x daily - 7 x weekly - 2 sets - 30 sec hold  ASSESSMENT:  CLINICAL IMPRESSION: Pt arrives for initial aquatic treatment session. Pt was educated in water principles with verbal understanding. Pt tolerated a basic, level 1 program today with care to not fatigue too much. Main verbal cues for control or LE movements and posture.     OBJECTIVE IMPAIRMENTS: decreased activity tolerance, decreased coordination, decreased endurance, decreased mobility, difficulty walking, decreased ROM, decreased strength, increased fascial restrictions, increased muscle spasms, impaired flexibility, impaired sensation, impaired tone, impaired UE functional use, improper body mechanics, postural dysfunction, and pain.   ACTIVITY LIMITATIONS: lifting, bending, standing, squatting, sleeping, reach over head, and locomotion level  PARTICIPATION LIMITATIONS: meal prep, cleaning,  laundry, shopping, community activity, occupation, and yard work  PERSONAL FACTORS: Age, Fitness, Past/current experiences, Profession, Time since onset of injury/illness/exacerbation, and 1 comorbidity: MS  are also affecting patient's functional outcome.   REHAB POTENTIAL: Good  CLINICAL DECISION MAKING: Evolving/moderate complexity  EVALUATION COMPLEXITY: Moderate   GOALS: Goals reviewed with patient? Yes  SHORT TERM GOALS: Target date: 08/08/2023   Pt will be ind with initial HEP Baseline: Goal status: INITIAL  2.  Pt will have improved lumbar ROM by at least 10% with no pain in all directions Baseline:  Goal status: INITIAL  3.  Pt will be able to reach behind his head/down his back without L shoulder pain  Baseline:  Goal status: INITIAL  4.  Pt will report decrease in overall pain by at least 25% with activities Baseline:  Goal status: INITIAL   LONG TERM GOALS: Target date: 09/05/2023   Pt will be ind with management and progression of HEP to transition to community wellness/gym activities again Baseline:  Goal status: INITIAL  2.  Pt will demo at least 4/5 L posterior shoulder strength for improved UE movement/endurance for conducting Baseline:  Goal status: INITIAL  3.  Pt will demo at least a 1/2 muscle grade increase with bilat LE strength to improve standing and walking tolerance Baseline:  Goal status: INITIAL  4.  Pt will be able to lift and carry at least 20# with manageable back pain for home and community tasks Baseline:  Goal status: INITIAL  5.  Pt will have improved Modified Oswestry to </=30% to demo MCID Baseline:  Goal status: INITIAL   PLAN:  PT FREQUENCY: 2x/week  PT DURATION: 8 weeks  PLANNED INTERVENTIONS: 97164- PT Re-evaluation, 97110-Therapeutic exercises, 97530- Therapeutic activity, 97112- Neuromuscular re-education, 97535- Self Care, 16109- Manual therapy, [phone]- Gait training, [phone]- Aquatic Therapy, [phone]- Electrical  stimulation (unattended), [id number]- Ultrasound, [id number]- Traction (mechanical), [id number]- Ionotophoresis 4mg /ml Dexamethasone, Patient/Family education, Balance training, Stair training, Taping, Dry Needling, Joint mobilization, Spinal mobilization, Cryotherapy, and Moist heat.  PLAN FOR NEXT SESSION: Modify/update HEP as indicated. Manual therapy/dry needling as indicated for L periscapular muscles and glute med, R QL (pt is interested in dry needling). Initiate posterior shoulder girdle strengthening/stretching. Continue to stretch and strengthen hips/core. May need further SIJ assessment. Interested in [place], PTA 07/12/2023, 12:35 PM

## 2023-07-11 NOTE — Therapy (Signed)
 OUTPATIENT PHYSICAL THERAPY THORACOLUMBAR EVALUATION   Patient Name: Cameron [name] MRN: [id number] DOB:1956/01/01, 68 y.o., male Today's Date: 07/11/2023  END OF SESSION:  PT End of Session - 07/11/23 1046     Visit Number 1    Number of Visits 16    Date for PT Re-Evaluation 09/05/23    Authorization Type [employer]    PT Start Time 0845    PT Stop Time 0935    PT Time Calculation (min) 50 min    Activity Tolerance Patient tolerated treatment well    Behavior During Therapy [facility] for tasks assessed/performed             Past Medical History:  Diagnosis Date   Abnormal MRI 2007   nonspecific white matter abnormalities   Allergy    seasonal   Anxiety    no meds   Arthritis    spine   Cataract    GERD (gastroesophageal reflux disease)    History of kidney stones    x 2 episodes- last 2020   Hx of adenomatous colonic polyps 09/02/2021   2 diminutive adenomas recall 2030   Idiopathic scoliosis    Migraines    OCULAR connected to MS   Multiple sclerosis (HCC)    Neuromuscular disorder (HCC)    MS - mild left side spasicity   Vertigo    1 episode.  approx 12/2021.  Lasted 2-3 days.   Past Surgical History:  Procedure Laterality Date   CATARACT EXTRACTION W/PHACO Right 06/28/2023   Procedure: CATARACT EXTRACTION PHACO AND INTRAOCULAR LENS PLACEMENT (IOC) RIGHT  6.17  00:42.8;  Surgeon: Lockie Mola, MD;  Location: [facility];  Service: Ophthalmology;  Laterality: Right;   COLONOSCOPY  06/27/2007   Normal   CYSTOSCOPY/URETEROSCOPY/HOLMIUM LASER/STENT PLACEMENT Right 07/27/2018   Procedure: CYSTOSCOPY/URETEROSCOPY/HOLMIUM LASER/STENT PLACEMENT;  Surgeon: Riki Altes, MD;  Location: [facility];  Service: Urology;  Laterality: Right;   LUMBAR DISC SURGERY     URETEROLITHOTOMY  11/16/2001   US ECHOCARDIOGRAPHY  11/16/2004   normal, Carotids normal   Patient Active Problem List   Diagnosis Date Noted   Early satiety 06/06/2023   Prostate  hypertrophy 07/12/2022   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   Hx of adenomatous colonic polyps 09/02/2021   Right optic neuritis 02/04/2021   Lumbar radiculopathy 06/25/2020   Other fatigue 06/25/2020   Lumbar disc herniation 05/01/2020   High risk medication use 11/04/2019   Vitamin D deficiency 11/04/2019   Left foot drop 11/04/2019   Vision disturbance 11/04/2019   Abnormal MRI of head 09/24/2019   Ureteral calculus 07/25/2018   Hydronephrosis with urinary obstruction due to ureteral calculus 07/25/2018   Sensorineural hearing loss (SNHL), bilateral 03/06/2017   Low back pain 05/29/2014   Routine general medical examination at a health care facility 02/23/2012   GERD (gastroesophageal reflux disease)    Hyperlipemia 10/03/2007   Seasonal allergic rhinitis due to pollen 07/19/2006   RENAL CALCULUS 07/19/2006   Multiple sclerosis (HCC) 10/16/2005    PCP: Karie Schwalbe, MD  REFERRING PROVIDER: Clovis Riley, PA-C  REFERRING DIAG: [phone] (ICD-10-CM) - Spondylosis without myelopathy or radiculopathy, lumbar region  Rationale for Evaluation and Treatment: Rehabilitation  THERAPY DIAG:  Muscle weakness (generalized)  Other low back pain  Pain in right hip  Pain in left hip  Chronic left shoulder pain  Abnormal posture  Other abnormalities of gait and mobility  ONSET DATE: Chronic; at least 2022  SUBJECTIVE:  SUBJECTIVE STATEMENT: "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 Pt notes spasticity on L and drop foot. He reports that during one of the falls he felt he injured his posterior L shoulder. Will get flare ups here -- especially after  rehearsals. Pt also notes history of scoliosis and cervical bulging disc s/p discectomy in 2022. Currently, has been having severe issues in posterior bilat hips and received injections in SIJ which helped temporarily but not very lasting results. Still gets low back pain that will get aggravated. Pt states he is primarily dealing with this low back and lateral hip pain that radiates to the front of his legs. States the back pain is pretty constant.   PERTINENT HISTORY:  L2-L3 ablation, discectomy, MS  PAIN:  Are you having pain? Yes: NPRS scale: currently 6 or 7; at worst 9  Pain location: low back and lateral hips Pain description: constant, radiating to front of legs Aggravating factors: walking ~2 miles Relieving factors: heat, lidocaine patch, pain medication  PAIN:  Are you having pain? Yes: NPRS scale: currently 7 to 8; at worst 8 Pain location: L posterior shoulder and upper trap Pain description: "nagging" Aggravating factors: conductor rehearsals Relieving factors: lidocaine patch    PRECAUTIONS: None  RED FLAGS: None   WEIGHT BEARING RESTRICTIONS: No  FALLS:  Has patient fallen in last 6 months? No  LIVING ENVIRONMENT: Lives with: lives with their family, husband Lives in: House/apartment Stairs: No Has following equipment at home: None  OCCUPATION: Programmer, multimedia  PLOF: Independent  PATIENT GOALS: "I would love to be able to do a moderate workout every day and stretching" (Not sure if the stretching he's doing is helping or hurting). "I want to be more active and feel secure with activity" Wants to learn more about energy conservation for his MS, decrease pain levels in general  NEXT MD VISIT: n/a  OBJECTIVE:  Note: Objective measures were completed at Evaluation unless otherwise noted.  DIAGNOSTIC FINDINGS:  11/29/22 Brain MRI IMPRESSION: This MRI of the brain with and without contrast shows the following: Multiple T2/FLAIR hyperintense foci in the cerebral  hemispheres in a pattern consistent with chronic demyelinating plaque associated with multiple sclerosis.  None of the foci enhanced or appear to be acute.  Compared to the MRI from 12/28/2021, there were no new lesions. No acute findings.  Normal enhancement pattern.  PATIENT SURVEYS:  Modified Oswestry Low Back Pain Disability Questionnaire: 20 / 50 = 40.0 %  COGNITION: Overall cognitive status: Within functional limits for tasks assessed     SENSATION: L finger N/T; "drop foot" on L with neuropathy  MUSCLE LENGTH: Did not assess on eval Figure 4 stretch is tighter on R hip in seated vs L hip  POSTURE: rounded shoulders and forward head  PALPATION: TTP L>R with trigger points: UT, mid/low trap, infraspinatus, lats/teres minor and major  TTP L>R glute med and hamstrings with trigger points, R>L QL with trigger points. Did not seem particularly tender with SIJ PAs  LUMBAR ROM:   AROM eval  Flexion 75%  Extension 25% (felt good)  Right lateral flexion 50%*  Left lateral flexion 30%  Right rotation 75%  Left rotation 75%   (Blank rows = not tested, * = pain)  UPPER EXTREMITY MMT: most painful when asked to place his hands behind his head and reach behind him to check functional external rotation ROM  MMT Right  eval Left eval  Shoulder flexion 5 5  Shoulder extension (prone) 5 4  Shoulder abduction 5 5  Shoulder adduction    Shoulder internal rotation 5 5  Shoulder external rotation (sitting) 5 3+  Middle trap (prone) 5 4-  Low trap (prone) 4 3-  Elbow flexion 5 5  Elbow extension 5 5   (Blank rows = not tested)   LOWER EXTREMITY MMT:    MMT Right eval Left eval  Hip flexion 4 3+  Hip extension 4 3+  Hip abduction 4 3+  Hip adduction    Hip internal rotation    Hip external rotation    Knee flexion 4 4-  Knee extension 5 5  Ankle dorsiflexion 5 4  Ankle plantarflexion    Ankle inversion    Ankle eversion     (Blank rows = not tested)  LUMBAR SPECIAL  TESTS:  Did not assess  FUNCTIONAL TESTS:  Plank for core strength TBA  GAIT: Distance walked: Into clinic Assistive device utilized: None Level of assistance: Complete Independence  TREATMENT DATE: 07/11/23                                                                                                                              See HEP below   PATIENT EDUCATION:  Education details: Exam findings, POC, initial HEP, aquatics, dry needling Person educated: Patient Education method: Explanation, Demonstration, and Handouts Education comprehension: verbalized understanding, returned demonstration, and needs further education  HOME EXERCISE PROGRAM: Access Code: [email or web address] URL: [email or web address] Date: 07/11/2023 Prepared by: Vernon Prey April Kirstie Peri  Exercises - Child's Pose with Sidebending  - 1 x daily - 7 x weekly - 2 sets - 30 sec hold - Seated Piriformis Stretch  - 1 x daily - 7 x weekly - 2 sets - 30 sec hold - Seated Figure 4 Piriformis Stretch  - 1 x daily - 7 x weekly - 2 sets - 30 sec hold - Supine Figure 4  - 1 x daily - 7 x weekly - 2 sets - 30 sec hold - Supine Piriformis Stretch with Foot on Ground  - 1 x daily - 7 x weekly - 2 sets - 30 sec hold  ASSESSMENT:  CLINICAL IMPRESSION: Mr. [place] Monds is a 68 y.o. M who was seen today for physical therapy evaluation and treatment for low back pain and L posterior shoulder pain. PMH is significant for MS (secondary progressive) and chronic back pain. Pt attempts to be active but states he has not been back to gym activities since November 2024. Assessment is significant for L posterior shoulder girdle weakness and L>R LE weakness in bilat hips with low back and hip tightness as well as L periscapular muscle trigger points affecting pt's ability to perform home, work and community tasks. Pt will benefit from PT to address these issues to maximize his level of function and reach his goals to be more active.  OBJECTIVE IMPAIRMENTS: decreased activity tolerance, decreased coordination, decreased endurance, decreased mobility, difficulty walking, decreased ROM, decreased strength, increased fascial restrictions, increased muscle spasms, impaired flexibility, impaired sensation, impaired tone, impaired UE functional use, improper body mechanics, postural dysfunction, and pain.   ACTIVITY LIMITATIONS: lifting, bending, standing, squatting, sleeping, reach over head, and locomotion level  PARTICIPATION LIMITATIONS: meal prep, cleaning, laundry, shopping, community activity, occupation, and yard work  PERSONAL FACTORS: Age, Fitness, Past/current experiences, Profession, Time since onset of injury/illness/exacerbation, and 1 comorbidity: MS  are also affecting patient's functional outcome.   REHAB POTENTIAL: Good  CLINICAL DECISION MAKING: Evolving/moderate complexity  EVALUATION COMPLEXITY: Moderate   GOALS: Goals reviewed with patient? Yes  SHORT TERM GOALS: Target date: 08/08/2023   Pt will be ind with initial HEP Baseline: Goal status: INITIAL  2.  Pt will have improved lumbar ROM by at least 10% with no pain in all directions Baseline:  Goal status: INITIAL  3.  Pt will be able to reach behind his head/down his back without L shoulder pain  Baseline:  Goal status: INITIAL  4.  Pt will report decrease in overall pain by at least 25% with activities Baseline:  Goal status: INITIAL   LONG TERM GOALS: Target date: 09/05/2023   Pt will be ind with management and progression of HEP to transition to community wellness/gym activities again Baseline:  Goal status: INITIAL  2.  Pt will demo at least 4/5 L posterior shoulder strength for improved UE movement/endurance for conducting Baseline:  Goal status: INITIAL  3.  Pt will demo at least a 1/2 muscle grade increase with bilat LE strength to improve standing and walking tolerance Baseline:  Goal status: INITIAL  4.  Pt will be  able to lift and carry at least 20# with manageable back pain for home and community tasks Baseline:  Goal status: INITIAL  5.  Pt will have improved Modified Oswestry to </=30% to demo MCID Baseline:  Goal status: INITIAL   PLAN:  PT FREQUENCY: 2x/week  PT DURATION: 8 weeks  PLANNED INTERVENTIONS: 97164- PT Re-evaluation, 97110-Therapeutic exercises, 97530- Therapeutic activity, 97112- Neuromuscular re-education, 97535- Self Care, 65784- Manual therapy, [phone]- Gait training, [phone]- Aquatic Therapy, [phone]- Electrical stimulation (unattended), [id number]- Ultrasound, [id number]- Traction (mechanical), [id number]- Ionotophoresis 4mg /ml Dexamethasone, Patient/Family education, Balance training, Stair training, Taping, Dry Needling, Joint mobilization, Spinal mobilization, Cryotherapy, and Moist heat.  PLAN FOR NEXT SESSION: Modify/update HEP as indicated. Manual therapy/dry needling as indicated for L periscapular muscles and glute med, R QL (pt is interested in dry needling). Initiate posterior shoulder girdle strengthening/stretching. Continue to stretch and strengthen hips/core. May need further SIJ assessment. Interested in [facility] April Ma L Sheffield, PT 07/11/2023, 10:49 AM

## 2023-07-11 NOTE — Patient Instructions (Signed)
Aquatic Rehab - What to Expect    Arrive 15 minutes early for your appointment and check in with the rehab front desk.  Please limit the use of body lotions and hair products before entering the pool.  Locker rooms are available for showering, changing, and using the restroom.  Appointments last 45 minutes with your therapist.  The pool is approximately 500 feet from the nearest parking spot. There are benches and chairs along the walk.  Please bring a support person if you need assistance traveling the distance to the pool or assistance with changing/using the restroom.  Stairs with handrails as well as a lift chair are available for use at the pool.  Rehab pool depth is 3'6"-4'8" and temperature range is 90-95 degrees. Lane pool depth is 3'6"-5'6" and temperature range is 85-88 degrees. In the case of rehab pool closure, aquatic Physical Therapy will be provided in the lane pool as [facility] aquatic [facility] schedule allows. Patients will receive a phone call if an appointment needs to be rescheduled. The pool deck is tile flooring and gets slippery, water shoes are strongly encouraged but not required.  Please wear a bathing suit or athletic shorts and a t-shirt.  It is recommended to bring your own towel.  In the case of severe weather:  Thunder or lightning will result in a closure of the pool deck for 30 minutes and will be extended with each incident.  Your appointment may be moved to land or canceled with the option to reschedule.   The following are contraindications for aquatic appointments: Open wounds  Active infection  Fear of water  Bowel incontinence

## 2023-07-12 ENCOUNTER — Ambulatory Visit: Admitting: Physical Therapy

## 2023-07-12 ENCOUNTER — Encounter: Payer: Self-pay | Admitting: Physical Therapy

## 2023-07-12 DIAGNOSIS — M6281 Muscle weakness (generalized): Secondary | ICD-10-CM

## 2023-07-12 DIAGNOSIS — M25552 Pain in left hip: Secondary | ICD-10-CM

## 2023-07-12 DIAGNOSIS — M25551 Pain in right hip: Secondary | ICD-10-CM

## 2023-07-12 DIAGNOSIS — R2689 Other abnormalities of gait and mobility: Secondary | ICD-10-CM

## 2023-07-12 DIAGNOSIS — M5459 Other low back pain: Secondary | ICD-10-CM

## 2023-07-12 DIAGNOSIS — R293 Abnormal posture: Secondary | ICD-10-CM

## 2023-07-12 DIAGNOSIS — G8929 Other chronic pain: Secondary | ICD-10-CM

## 2023-07-13 ENCOUNTER — Other Ambulatory Visit: Payer: Self-pay | Admitting: Neurology

## 2023-07-13 MED ORDER — HYDROCODONE-ACETAMINOPHEN 7.5-325 MG PO TABS: 0.5000 | ORAL_TABLET | Freq: Two times a day (BID) | ORAL | 0 refills | Status: DC | PRN

## 2023-07-13 NOTE — Telephone Encounter (Signed)
 Last seen 05/24/23 and next f/u 12/12/23. Last refilled 04/27/23 #60.

## 2023-07-13 NOTE — Telephone Encounter (Signed)
 Pt is requesting a refill for HYDROcodone-acetaminophen (NORCO) 7.5-325 MG tablet .  Pharmacy: [facility] [phone]

## 2023-07-17 ENCOUNTER — Encounter: Payer: Self-pay | Admitting: Certified Registered Nurse Anesthetist

## 2023-07-18 ENCOUNTER — Ambulatory Visit: Attending: Surgery

## 2023-07-18 DIAGNOSIS — M5459 Other low back pain: Secondary | ICD-10-CM | POA: Diagnosis not present

## 2023-07-18 DIAGNOSIS — R252 Cramp and spasm: Secondary | ICD-10-CM | POA: Diagnosis not present

## 2023-07-18 DIAGNOSIS — M25552 Pain in left hip: Secondary | ICD-10-CM | POA: Diagnosis not present

## 2023-07-18 DIAGNOSIS — M6281 Muscle weakness (generalized): Secondary | ICD-10-CM | POA: Diagnosis not present

## 2023-07-18 DIAGNOSIS — M25512 Pain in left shoulder: Secondary | ICD-10-CM | POA: Insufficient documentation

## 2023-07-18 DIAGNOSIS — G8929 Other chronic pain: Secondary | ICD-10-CM | POA: Insufficient documentation

## 2023-07-18 DIAGNOSIS — R262 Difficulty in walking, not elsewhere classified: Secondary | ICD-10-CM | POA: Insufficient documentation

## 2023-07-18 DIAGNOSIS — R2689 Other abnormalities of gait and mobility: Secondary | ICD-10-CM | POA: Diagnosis present

## 2023-07-18 DIAGNOSIS — R293 Abnormal posture: Secondary | ICD-10-CM | POA: Diagnosis not present

## 2023-07-18 DIAGNOSIS — M25551 Pain in right hip: Secondary | ICD-10-CM | POA: Insufficient documentation

## 2023-07-18 NOTE — Therapy (Signed)
 OUTPATIENT PHYSICAL THERAPY THORACOLUMBAR TREATMENT NOTE   Patient Name: Cameron [name] MRN: [id number] DOB:1956-03-01, 68 y.o., male Today's Date: 07/18/2023  END OF SESSION:  PT End of Session - 07/18/23 0946     Visit Number 3    Number of Visits 16    Date for PT Re-Evaluation 09/05/23    Authorization Type [employer]    PT Start Time [phone]    PT Stop Time 1034    PT Time Calculation (min) 51 min    Activity Tolerance Patient tolerated treatment well    Behavior During Therapy [facility] for tasks assessed/performed               Past Medical History:  Diagnosis Date   Abnormal MRI 2007   nonspecific white matter abnormalities   Allergy    seasonal   Anxiety    no meds   Arthritis    spine   Cataract    GERD (gastroesophageal reflux disease)    History of kidney stones    x 2 episodes- last 2020   Hx of adenomatous colonic polyps 09/02/2021   2 diminutive adenomas recall 2030   Idiopathic scoliosis    Migraines    OCULAR connected to MS   Multiple sclerosis (HCC)    Neuromuscular disorder (HCC)    MS - mild left side spasicity   Vertigo    1 episode.  approx 12/2021.  Lasted 2-3 days.   Past Surgical History:  Procedure Laterality Date   CATARACT EXTRACTION W/PHACO Right 06/28/2023   Procedure: CATARACT EXTRACTION PHACO AND INTRAOCULAR LENS PLACEMENT (IOC) RIGHT  6.17  00:42.8;  Surgeon: Lockie Mola, MD;  Location: [facility];  Service: Ophthalmology;  Laterality: Right;   COLONOSCOPY  06/27/2007   Normal   CYSTOSCOPY/URETEROSCOPY/HOLMIUM LASER/STENT PLACEMENT Right 07/27/2018   Procedure: CYSTOSCOPY/URETEROSCOPY/HOLMIUM LASER/STENT PLACEMENT;  Surgeon: Riki Altes, MD;  Location: [facility];  Service: Urology;  Laterality: Right;   LUMBAR DISC SURGERY     URETEROLITHOTOMY  11/16/2001   US ECHOCARDIOGRAPHY  11/16/2004   normal, Carotids normal   Patient Active Problem List   Diagnosis Date Noted   Early satiety 06/06/2023    Prostate hypertrophy 07/12/2022   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   Hx of adenomatous colonic polyps 09/02/2021   Right optic neuritis 02/04/2021   Lumbar radiculopathy 06/25/2020   Other fatigue 06/25/2020   Lumbar disc herniation 05/01/2020   High risk medication use 11/04/2019   Vitamin D deficiency 11/04/2019   Left foot drop 11/04/2019   Vision disturbance 11/04/2019   Abnormal MRI of head 09/24/2019   Ureteral calculus 07/25/2018   Hydronephrosis with urinary obstruction due to ureteral calculus 07/25/2018   Sensorineural hearing loss (SNHL), bilateral 03/06/2017   Low back pain 05/29/2014   Routine general medical examination at a health care facility 02/23/2012   GERD (gastroesophageal reflux disease)    Hyperlipemia 10/03/2007   Seasonal allergic rhinitis due to pollen 07/19/2006   RENAL CALCULUS 07/19/2006   Multiple sclerosis (HCC) 10/16/2005    PCP: Karie Schwalbe, MD  REFERRING PROVIDER: Clovis Riley, PA-C  REFERRING DIAG: [phone] (ICD-10-CM) - Spondylosis without myelopathy or radiculopathy, lumbar region  Rationale for Evaluation and Treatment: Rehabilitation  THERAPY DIAG:  Other low back pain  Muscle weakness (generalized)  Abnormal posture  Pain in right hip  Difficulty in walking, not elsewhere classified  Cramp and spasm  Pain in left hip  ONSET DATE: Chronic; at least 2022  SUBJECTIVE:                                                                                                                                                                                           SUBJECTIVE STATEMENT: The pool is great.  I am doing better. Still tight on left hip and LE.   PERTINENT HISTORY:  L2-L3 ablation, discectomy, MS  PAIN:  07/18/23 Are you having pain? Yes: NPRS scale: currently 6/10, stretched at home and it was a 4/10 when I came in Pain location: low back and lateral hips Pain description:  constant, radiating to front of legs Aggravating factors: walking ~2 miles Relieving factors: heat, lidocaine patch, pain medication     PRECAUTIONS: None  RED FLAGS: None   WEIGHT BEARING RESTRICTIONS: No  FALLS:  Has patient fallen in last 6 months? No  LIVING ENVIRONMENT: Lives with: lives with their family, husband Lives in: House/apartment Stairs: No Has following equipment at home: None  OCCUPATION: Programmer, multimedia  PLOF: Independent  PATIENT GOALS: "I would love to be able to do a moderate workout every day and stretching" (Not sure if the stretching he's doing is helping or hurting). "I want to be more active and feel secure with activity" Wants to learn more about energy conservation for his MS, decrease pain levels in general  NEXT MD VISIT: n/a  OBJECTIVE:  Note: Objective measures were completed at Evaluation unless otherwise noted.  DIAGNOSTIC FINDINGS:  11/29/22 Brain MRI IMPRESSION: This MRI of the brain with and without contrast shows the following: Multiple T2/FLAIR hyperintense foci in the cerebral hemispheres in a pattern consistent with chronic demyelinating plaque associated with multiple sclerosis.  None of the foci enhanced or appear to be acute.  Compared to the MRI from 12/28/2021, there were no new lesions. No acute findings.  Normal enhancement pattern.  PATIENT SURVEYS:  Modified Oswestry Low Back Pain Disability Questionnaire: 20 / 50 = 40.0 %  COGNITION: Overall cognitive status: Within functional limits for tasks assessed     SENSATION: L finger N/T; "drop foot" on L with neuropathy  MUSCLE LENGTH: Did not assess on eval Figure 4 stretch is tighter on R hip in seated vs L hip  POSTURE: rounded shoulders and forward head  PALPATION: TTP L>R with trigger points: UT, mid/low trap, infraspinatus, lats/teres minor and major  TTP L>R glute med and hamstrings with trigger points, R>L QL with trigger points. Did not seem particularly tender with  SIJ PAs  LUMBAR ROM:   AROM eval  Flexion 75%  Extension 25% (felt good)  Right lateral flexion 50%*  Left lateral flexion 30%  Right  rotation 75%  Left rotation 75%   (Blank rows = not tested, * = pain)  UPPER EXTREMITY MMT: most painful when asked to place his hands behind his head and reach behind him to check functional external rotation ROM  MMT Right eval Left eval  Shoulder flexion 5 5  Shoulder extension (prone) 5 4  Shoulder abduction 5 5  Shoulder adduction    Shoulder internal rotation 5 5  Shoulder external rotation (sitting) 5 3+  Middle trap (prone) 5 4-  Low trap (prone) 4 3-  Elbow flexion 5 5  Elbow extension 5 5   (Blank rows = not tested)   LOWER EXTREMITY MMT:    MMT Right eval Left eval  Hip flexion 4 3+  Hip extension 4 3+  Hip abduction 4 3+  Hip adduction    Hip internal rotation    Hip external rotation    Knee flexion 4 4-  Knee extension 5 5  Ankle dorsiflexion 5 4  Ankle plantarflexion    Ankle inversion    Ankle eversion     (Blank rows = not tested)  LUMBAR SPECIAL TESTS:  Did not assess  FUNCTIONAL TESTS:  Plank for core strength TBA  GAIT: Distance walked: Into clinic Assistive device utilized: None Level of assistance: Complete Independence  TREATMENT DATE:  07/18/23: Nustep x 5 min level 6 Standing hamstring stretch 3 x 30 sec Standing hamstring stretch 3 x 30 sec Seated piriformis stretch 3 x 30 sec Supine PPT x 20 Supine PPT with 90/90 heel tap x 20 PPT with alternating arm and leg x 20 Lower trunk rotation x 20 Seated mini sit ups with 5 lb kb x 20 Education on anatomy of the lumbar spine and pelvis, proper posture and body mechanics, pain control   07/12/23:Pt arrives for aquatic physical therapy. Treatment took place in 3.5-5.5 feet of water. Water temperature was 91 degrees F. Pt entered the pool via independently with mild use of rails. Pt requires buoyancy of water for support and to offload joints with  strengthening exercises.  Pt utilizes viscosity of the water required for strengthening. Seated water bench with 75% submersion Pt performed seated LE AROM exercises 20x in all planes, concurrent discussion/education on water principles and how to use them as well as pt's current status and goals. 75% depth water walking 4x in each direction. Heel lifts 10x. Hip flex/ext 10x Bil and abduction Bil 10x. VC for control Static marching min asst of pool wall for balance 10x Bil. Seated decompression with large noodle behind patient. PTA providing min asst for trunk control.  07/11/23                                                                                                                              See HEP below   PATIENT EDUCATION:  Education details: Exam findings, POC, initial HEP, aquatics, dry needling Person educated: Patient Education method: Explanation, Demonstration, and Handouts  Education comprehension: verbalized understanding, returned demonstration, and needs further education  HOME EXERCISE PROGRAM: Access Code: [email or web address] URL: [email or web address] Date: 07/18/2023 Prepared by: Mikey Kirschner  Exercises - Child's Pose with Sidebending  - 1 x daily - 7 x weekly - 2 sets - 30 sec hold - Seated Piriformis Stretch  - 1 x daily - 7 x weekly - 2 sets - 30 sec hold - Seated Figure 4 Piriformis Stretch  - 1 x daily - 7 x weekly - 2 sets - 30 sec hold - Supine Figure 4  - 1 x daily - 7 x weekly - 2 sets - 30 sec hold - Supine Piriformis Stretch with Foot on Ground  - 1 x daily - 7 x weekly - 2 sets - 30 sec hold - Standing Hamstring Stretch on Chair  - 1 x daily - 7 x weekly - 1 sets - 3 reps - 30 sec hold - Quadricep Stretch with Chair and Counter Support  - 1 x daily - 7 x weekly - 1 sets - 3 reps - 30 sec hold - Supine Posterior Pelvic Tilt  - 1 x daily - 7 x weekly - 1 sets - 20 reps - Supine 90/90 Alternating Heel Touches with Posterior Pelvic Tilt  - 1 x daily  - 7 x weekly - 1 sets - 20 reps - Dead Bug  - 1 x daily - 7 x weekly - 1 sets - 20 reps  ASSESSMENT:  CLINICAL IMPRESSION: Annette Stable was able to tolerate addition of core strengthening without increased pain.  He has significant left LE tightness.  He seems compliant and well motivated .  He should continue to improve.       OBJECTIVE IMPAIRMENTS: decreased activity tolerance, decreased coordination, decreased endurance, decreased mobility, difficulty walking, decreased ROM, decreased strength, increased fascial restrictions, increased muscle spasms, impaired flexibility, impaired sensation, impaired tone, impaired UE functional use, improper body mechanics, postural dysfunction, and pain.   ACTIVITY LIMITATIONS: lifting, bending, standing, squatting, sleeping, reach over head, and locomotion level  PARTICIPATION LIMITATIONS: meal prep, cleaning, laundry, shopping, community activity, occupation, and yard work  PERSONAL FACTORS: Age, Fitness, Past/current experiences, Profession, Time since onset of injury/illness/exacerbation, and 1 comorbidity: MS  are also affecting patient's functional outcome.   REHAB POTENTIAL: Good  CLINICAL DECISION MAKING: Evolving/moderate complexity  EVALUATION COMPLEXITY: Moderate   GOALS: Goals reviewed with patient? Yes  SHORT TERM GOALS: Target date: 08/08/2023   Pt will be ind with initial HEP Baseline: Goal status: INITIAL  2.  Pt will have improved lumbar ROM by at least 10% with no pain in all directions Baseline:  Goal status: INITIAL  3.  Pt will be able to reach behind his head/down his back without L shoulder pain  Baseline:  Goal status: INITIAL  4.  Pt will report decrease in overall pain by at least 25% with activities Baseline:  Goal status: INITIAL   LONG TERM GOALS: Target date: 09/05/2023   Pt will be ind with management and progression of HEP to transition to community wellness/gym activities again Baseline:  Goal status:  INITIAL  2.  Pt will demo at least 4/5 L posterior shoulder strength for improved UE movement/endurance for conducting Baseline:  Goal status: INITIAL  3.  Pt will demo at least a 1/2 muscle grade increase with bilat LE strength to improve standing and walking tolerance Baseline:  Goal status: INITIAL  4.  Pt will be able to lift and carry at least 20# with manageable  back pain for home and community tasks Baseline:  Goal status: INITIAL  5.  Pt will have improved Modified Oswestry to </=30% to demo MCID Baseline:  Goal status: INITIAL   PLAN:  PT FREQUENCY: 2x/week  PT DURATION: 8 weeks  PLANNED INTERVENTIONS: 97164- PT Re-evaluation, 97110-Therapeutic exercises, 97530- Therapeutic activity, 97112- Neuromuscular re-education, 97535- Self Care, 16109- Manual therapy, [phone]- Gait training, [phone]- Aquatic Therapy, [phone]- Electrical stimulation (unattended), [id number]- Ultrasound, [id number]- Traction (mechanical), [id number]- Ionotophoresis 4mg /ml Dexamethasone, Patient/Family education, Balance training, Stair training, Taping, Dry Needling, Joint mobilization, Spinal mobilization, Cryotherapy, and Moist heat.  PLAN FOR NEXT SESSION: Progress HEP as indicated. Manual therapy/dry needling as indicated for L periscapular muscles and glute med, R QL (pt is interested in dry needling). Initiate posterior shoulder girdle strengthening/stretching. Continue to stretch and strengthen hips/core. May need further SIJ assessment. Interested in [employer] B. Azile Minardi, PT 07/18/23 7:10 PM [facility] [address], [address] [place], [place] [address] Phone # [phone] Fax [phone]

## 2023-07-19 ENCOUNTER — Other Ambulatory Visit: Payer: Self-pay | Admitting: Neurology

## 2023-07-19 MED ORDER — METHYLPHENIDATE HCL 20 MG PO TABS: ORAL_TABLET | ORAL | 0 refills | Status: DC

## 2023-07-19 NOTE — Telephone Encounter (Signed)
Pt is requesting a refill for methylphenidate (RITALIN) 20 MG tablet .  Pharmacy: [facility] [id number]

## 2023-07-19 NOTE — Progress Notes (Unsigned)
 [facility] History and Physical   Primary Care Physician:  Karie Schwalbe, MD   Reason for Procedure:   Early satiety/Dyspepsia  Plan:    EGD     HPI: Cameron [name] is a 68 y.o. male w/ hx of MS and was seen in the office in Feb 2025 w/ c/o early satiety and dyspepsia. No weight loss. Does have some nocturnal mucous production he wonders if could be reflux.  Wt Readings from Last 3 Encounters:  07/20/23 175 lb (79.4 kg)  07/05/23 175 lb (79.4 kg)  06/28/23 176 lb 8 oz (80.1 kg)    Past Medical History:  Diagnosis Date   Abnormal MRI 2007   nonspecific white matter abnormalities   Allergy    seasonal   Anxiety    no meds   Arthritis    spine   Cataract    GERD (gastroesophageal reflux disease)    History of kidney stones    x 2 episodes- last 2020   Hx of adenomatous colonic polyps 09/02/2021   2 diminutive adenomas recall 2030   Idiopathic scoliosis    Migraines    OCULAR connected to MS   Multiple sclerosis (HCC)    Neuromuscular disorder (HCC)    MS - mild left side spasicity   Vertigo    1 episode.  approx 12/2021.  Lasted 2-3 days.    Past Surgical History:  Procedure Laterality Date   CATARACT EXTRACTION W/PHACO Right 06/28/2023   Procedure: CATARACT EXTRACTION PHACO AND INTRAOCULAR LENS PLACEMENT (IOC) RIGHT  6.17  00:42.8;  Surgeon: Lockie Mola, MD;  Location: [facility];  Service: Ophthalmology;  Laterality: Right;   COLONOSCOPY  06/27/2007   Normal   CYSTOSCOPY/URETEROSCOPY/HOLMIUM LASER/STENT PLACEMENT Right 07/27/2018   Procedure: CYSTOSCOPY/URETEROSCOPY/HOLMIUM LASER/STENT PLACEMENT;  Surgeon: Riki Altes, MD;  Location: [facility];  Service: Urology;  Laterality: Right;   LUMBAR DISC SURGERY     URETEROLITHOTOMY  11/16/2001   US ECHOCARDIOGRAPHY  11/16/2004   normal, Carotids normal    Prior to Admission medications   Medication Sig Start Date End Date Taking? Authorizing Provider  ALPRAZolam Prudy Feeler) 0.5  MG tablet Take 2 tablets thirty minutes prior to MRI.  May take one additional tablet before entering scanner, if needed.  MUST HAVE DRIVER. 12/24/09   Sater, Pearletha Furl, MD  Armodafinil 200 MG TABS Take 1 tablet (200 mg total) by mouth in the morning. 02/27/23   Sater, Pearletha Furl, MD  Cholecalciferol (DIALYVITE VITAMIN D 5000 PO) Take by mouth daily.    [provider]  cyclobenzaprine (FLEXERIL) 10 MG tablet Take 1 tablet (10 mg total) by mouth at bedtime. 01/25/23   Sater, Pearletha Furl, MD  fluticasone (FLONASE) 50 MCG/ACT nasal spray Place 2 sprays into both nostrils daily. In each nostril 11/18/15   Karie Schwalbe, MD  gabapentin (NEURONTIN) 300 MG capsule One po tid 05/24/23   Sater, Pearletha Furl, MD  HYDROcodone-acetaminophen (NORCO) 7.5-325 MG tablet Take 0.5-1 tablets by mouth 2 (two) times daily as needed for moderate pain (pain score 4-6). 07/13/23   Sater, Pearletha Furl, MD  hydrocortisone (ANUCORT-HC) 25 MG suppository UNWRAP AND INSERT 1 SUPPOSITORY INTO THE RECTUM THREE TIMES A DAY AS NEEDED 04/07/23   Tillman Abide I, MD  ibuprofen (ADVIL,MOTRIN) 200 MG tablet Take 200-400 mg by mouth every 6 (six) hours as needed.    [provider]  leflunomide (ARAVA) 20 MG tablet TAKE 1 TABLET BY MOUTH DAILY 03/30/23   Despina Arias  A, MD  methylphenidate (RITALIN) 20 MG tablet Take one po AM and one po at noon 07/19/23   Sater, Richard A, MD  omeprazole (PRILOSEC) 20 MG capsule Take 20 mg by mouth daily.    [provider]    Current Outpatient Medications  Medication Sig Dispense Refill   Armodafinil 200 MG TABS Take 1 tablet (200 mg total) by mouth in the morning. 30 tablet 5   Cholecalciferol (DIALYVITE VITAMIN D 5000 PO) Take by mouth daily.     cyclobenzaprine (FLEXERIL) 10 MG tablet Take 1 tablet (10 mg total) by mouth at bedtime. 90 tablet 3   fluticasone (FLONASE) 50 MCG/ACT nasal spray Place 2 sprays into both nostrils daily. In each nostril 16 g 12   gabapentin  (NEURONTIN) 300 MG capsule One po tid 90 capsule 5   HYDROcodone-acetaminophen (NORCO) 7.5-325 MG tablet Take 0.5-1 tablets by mouth 2 (two) times daily as needed for moderate pain (pain score 4-6). 60 tablet 0   hydrocortisone (ANUCORT-HC) 25 MG suppository UNWRAP AND INSERT 1 SUPPOSITORY INTO THE RECTUM THREE TIMES A DAY AS NEEDED 15 suppository 11   leflunomide (ARAVA) 20 MG tablet TAKE 1 TABLET BY MOUTH DAILY 90 tablet 1   methylphenidate (RITALIN) 20 MG tablet Take one po AM and one po at noon 60 tablet 0   omeprazole (PRILOSEC) 20 MG capsule Take 20 mg by mouth daily.     ALPRAZolam (XANAX) 0.5 MG tablet Take 2 tablets thirty minutes prior to MRI.  May take one additional tablet before entering scanner, if needed.  MUST HAVE DRIVER. 3 tablet 0   ibuprofen (ADVIL,MOTRIN) 200 MG tablet Take 200-400 mg by mouth every 6 (six) hours as needed.     Current Facility-Administered Medications  Medication Dose Route Frequency Provider Last Rate Last Admin   0.9 %  sodium chloride infusion  500 mL Intravenous Continuous Iva Boop, MD        Allergies as of 07/20/2023 - Review Complete 07/20/2023  Allergen Reaction Noted   Citalopram hydrobromide Itching and Nausea Only 04/28/2006   Penicillins Other (See Comments) 04/28/2006    Family History  Problem Relation Age of Onset   Diabetes Father    Heart disease Father    Dementia Father    Lumbar disc disease Brother    Dementia Paternal Grandmother    CAD Neg Hx    Hypertension Neg Hx    Cancer Neg Hx    Colon cancer Neg Hx    Colon polyps Neg Hx    Esophageal cancer Neg Hx    Stomach cancer Neg Hx    Rectal cancer Neg Hx     Social History   Socioeconomic History   Marital status: Married    Spouse name: Not on file   Number of children: Not on file   Years of education: Not on file   Highest education level: Not on file  Occupational History   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: [employer]  Tobacco Use   Smoking status: Never    Passive exposure: Past   Smokeless tobacco: Never  Vaping Use   Vaping status: Never Used  Substance and Sexual Activity   Alcohol use: Yes    Alcohol/week: 0.0 standard drinks of alcohol    Comment: rare- once a month   Drug use: No   Sexual activity: Not on file  Other Topics Concern   Not on file  Social History Narrative  Lives   Caffeine use:    Long standing relationship with partner--finally able to marry   Social Drivers of Corporate investment banker Strain: Not on file  Food Insecurity: Not on file  Transportation Needs: Not on file  Physical Activity: Not on file  Stress: Not on file  Social Connections: Not on file  Intimate Partner Violence: Not on file    Review of Systems:  All other review of systems negative except as mentioned in the HPI.  Physical Exam: Vital signs BP (!) 142/99   Pulse 86   Temp (!) 97.3 F (36.3 C)   Ht 5' 10.5" (1.791 m)   Wt 175 lb (79.4 kg)   SpO2 97%   BMI 24.75 kg/m   General:   Alert,  Well-developed, well-nourished, pleasant and cooperative in NAD Lungs:  Clear throughout to auscultation.   Heart:  Regular rate and rhythm; no murmurs, clicks, rubs,  or gallops. Abdomen:  Soft, nontender and nondistended. Normal bowel sounds.   Neuro/Psych:  Alert and cooperative. Normal mood and affect. A and O x 3   @Bernabe Dorce  Sena Slate, MD, [facility] [phone] (pager) 07/20/2023 9:50 AM@

## 2023-07-19 NOTE — Telephone Encounter (Signed)
 Last seen 05/24/23 and next f/u 12/12/23. Last refilled 06/19/23 #60.

## 2023-07-20 ENCOUNTER — Encounter: Payer: Self-pay | Admitting: Internal Medicine

## 2023-07-20 ENCOUNTER — Ambulatory Visit: Admitting: Internal Medicine

## 2023-07-20 VITALS — BP 118/77 | HR 76 | Temp 97.3°F | Resp 16 | Ht 70.5 in | Wt 175.0 lb

## 2023-07-20 DIAGNOSIS — B9681 Helicobacter pylori [H. pylori] as the cause of diseases classified elsewhere: Secondary | ICD-10-CM

## 2023-07-20 DIAGNOSIS — R1013 Epigastric pain: Secondary | ICD-10-CM | POA: Diagnosis not present

## 2023-07-20 DIAGNOSIS — R6881 Early satiety: Secondary | ICD-10-CM

## 2023-07-20 DIAGNOSIS — K31A19 Gastric intestinal metaplasia without dysplasia, unspecified site: Secondary | ICD-10-CM

## 2023-07-20 DIAGNOSIS — G35 Multiple sclerosis: Secondary | ICD-10-CM | POA: Diagnosis not present

## 2023-07-20 DIAGNOSIS — F419 Anxiety disorder, unspecified: Secondary | ICD-10-CM | POA: Diagnosis not present

## 2023-07-20 DIAGNOSIS — K2950 Unspecified chronic gastritis without bleeding: Secondary | ICD-10-CM

## 2023-07-20 DIAGNOSIS — K2970 Gastritis, unspecified, without bleeding: Secondary | ICD-10-CM

## 2023-07-20 HISTORY — DX: Helicobacter pylori (H. pylori) as the cause of diseases classified elsewhere: B96.81

## 2023-07-20 MED ORDER — SODIUM CHLORIDE 0.9 % IV SOLN
500.0000 mL | INTRAVENOUS | Status: DC
Start: 2023-07-20 — End: 2023-07-20

## 2023-07-20 NOTE — Op Note (Signed)
 [facility] Patient Name: Cameron [name] Procedure Date: 07/20/2023 9:49 AM MRN: [id number] Endoscopist: Iva Boop , MD, [id number] Age: 68 Referring MD:  Date of Birth: 1956-02-14 Gender: Male Account #: [id number] Procedure:                Upper GI endoscopy Indications:              Dyspepsia, Early satiety Medicines:                Monitored Anesthesia Care Procedure:                Pre-Anesthesia Assessment:                           - Prior to the procedure, a History and Physical                            was performed, and patient medications and                            allergies were reviewed. The patient's tolerance of                            previous anesthesia was also reviewed. The risks                            and benefits of the procedure and the sedation                            options and risks were discussed with the patient.                            All questions were answered, and informed consent                            was obtained. Prior Anticoagulants: The patient has                            taken no anticoagulant or antiplatelet agents. ASA                            Grade Assessment: II - A patient with mild systemic                            disease. After reviewing the risks and benefits,                            the patient was deemed in satisfactory condition to                            undergo the procedure.                           After obtaining informed consent, the endoscope was  passed under direct vision. Throughout the                            procedure, the patient's blood pressure, pulse, and                            oxygen saturations were monitored continuously. The                            GIF [id number] #[id number] was introduced through the                            mouth, and advanced to the second part of duodenum.                            The upper GI endoscopy was  accomplished without                            difficulty. The patient tolerated the procedure                            well. Scope In: Scope Out: Findings:                 Patchy mild inflammation characterized by                            erythematous and mottled mucosa was found in the                            gastric body and in the gastric antrum. Biopsies                            were taken with a cold forceps for histology.                            Verification of patient identification for the                            specimen was done. Estimated blood loss was minimal.                           The exam was otherwise without abnormality.                           The cardia and gastric fundus were otherwise normal                            on retroflexion. Complications:            No immediate complications. Estimated Blood Loss:     Estimated blood loss was minimal. Impression:               - Gastritis suspected. Biopsied. Sydney protocol  antrum and body.                           - The examination was otherwise normal. Recommendation:           - Patient has a contact number available for                            emergencies. The signs and symptoms of potential                            delayed complications were discussed with the                            patient. Return to normal activities tomorrow.                            Written discharge instructions were provided to the                            patient.                           - Resume previous diet.                           - Continue present medications.                           - Await pathology results. treat H. pylori if                            present. Consider buspirone. Iva Boop, MD 07/20/2023 10:09:54 AM This report has been signed electronically.

## 2023-07-20 NOTE — Progress Notes (Signed)
 Pt's states no medical or surgical changes since previsit or office visit.

## 2023-07-20 NOTE — Progress Notes (Signed)
 Called to room to assist during endoscopic procedure.  Patient ID and intended procedure confirmed with present staff. Received instructions for my participation in the procedure from the performing physician.

## 2023-07-20 NOTE — Patient Instructions (Addendum)
 The stomach looks inflamed. We call that gastritis - I took biopsies to understand more. It is possible that this could be the cause of symptoms.  Stress/anxiety could also be contributing.  Once I see pathology results I will make treatment recommendations.  I appreciate the opportunity to care for you. Iva Boop, MD, [facility]  -Handout on gastritis provided -await pathology results -Continue present medications    YOU HAD AN ENDOSCOPIC PROCEDURE TODAY AT THE [facility]:   Refer to the procedure report that was given to you for any specific questions about what was found during the examination.  If the procedure report does not answer your questions, please call your gastroenterologist to clarify.  If you requested that your care partner not be given the details of your procedure findings, then the procedure report has been included in a sealed envelope for you to review at your convenience later.  YOU SHOULD EXPECT: Some feelings of bloating in the abdomen. Passage of more gas than usual.  Walking can help get rid of the air that was put into your GI tract during the procedure and reduce the bloating. If you had a lower endoscopy (such as a colonoscopy or flexible sigmoidoscopy) you may notice spotting of blood in your stool or on the toilet paper. If you underwent a bowel prep for your procedure, you may not have a normal bowel movement for a few days.  Please Note:  You might notice some irritation and congestion in your nose or some drainage.  This is from the oxygen used during your procedure.  There is no need for concern and it should clear up in a day or so.  SYMPTOMS TO REPORT IMMEDIATELY:  Following upper endoscopy (EGD)  Vomiting of blood or coffee ground material  New chest pain or pain under the shoulder blades  Painful or persistently difficult swallowing  New shortness of breath  Fever of 100F or higher  Black, tarry-looking stools  For urgent or emergent  issues, a gastroenterologist can be reached at any hour by calling [phone]. Do not use MyChart messaging for urgent concerns.    DIET:  We do recommend a small meal at first, but then you may proceed to your regular diet.  Drink plenty of fluids but you should avoid alcoholic beverages for 24 hours.  ACTIVITY:  You should plan to take it easy for the rest of today and you should NOT DRIVE or use heavy machinery until tomorrow (because of the sedation medicines used during the test).    FOLLOW UP: Our staff will call the number listed on your records the next business day following your procedure.  We will call around 7:15- 8:00 am to check on you and address any questions or concerns that you may have regarding the information given to you following your procedure. If we do not reach you, we will leave a message.     If any biopsies were taken you will be contacted by phone or by letter within the next 1-3 weeks.  Please call us at [phone] if you have not heard about the biopsies in 3 weeks.    SIGNATURES/CONFIDENTIALITY: You and/or your care partner have signed paperwork which will be entered into your electronic medical record.  These signatures attest to the fact that that the information above on your After Visit Summary has been reviewed and is understood.  Full responsibility of the confidentiality of this discharge information lies with you and/or your  care-partner.

## 2023-07-20 NOTE — Progress Notes (Signed)
0954 Robinul 0.1 mg IV given due large amount of secretions upon assessment.  MD made aware, vss

## 2023-07-20 NOTE — Progress Notes (Signed)
 Report given to PACU, vss

## 2023-07-21 ENCOUNTER — Encounter: Admitting: Physical Therapy

## 2023-07-21 ENCOUNTER — Telehealth: Payer: Self-pay

## 2023-07-21 NOTE — Telephone Encounter (Signed)
  Follow up Call-     07/20/2023    9:38 AM 09/02/2021   10:10 AM  Call back number  Post procedure Call Back phone  # [phone]  Permission to leave phone message Yes Yes     Patient questions:  Do you have a fever, pain , or abdominal swelling? No. Pain Score  0 *  Have you tolerated food without any problems? Yes.    Have you been able to return to your normal activities? Yes.    Do you have any questions about your discharge instructions: Diet   No. Medications  No. Follow up visit  No.  Do you have questions or concerns about your Care? No.  Actions: * If pain score is 4 or above: No action needed, pain <4.

## 2023-07-21 NOTE — Therapy (Signed)
 OUTPATIENT PHYSICAL THERAPY THORACOLUMBAR TREATMENT NOTE   Patient Name: Cameron [name] MRN: [id number] DOB:17-Sep-1955, 68 y.o., male Today's Date: 07/24/2023  END OF SESSION:  PT End of Session - 07/24/23 1230     Visit Number 4    Number of Visits 16    Date for PT Re-Evaluation 09/05/23    Authorization Type [employer]    PT Start Time 1147    PT Stop Time 1230    PT Time Calculation (min) 43 min    Activity Tolerance Patient tolerated treatment well    Behavior During Therapy [facility] for tasks assessed/performed                Past Medical History:  Diagnosis Date   Abnormal MRI 2007   nonspecific white matter abnormalities   Allergy    seasonal   Anxiety    no meds   Arthritis    spine   Cataract    GERD (gastroesophageal reflux disease)    History of kidney stones    x 2 episodes- last 2020   Hx of adenomatous colonic polyps 09/02/2021   2 diminutive adenomas recall 2030   Idiopathic scoliosis    Migraines    OCULAR connected to MS   Multiple sclerosis (HCC)    Neuromuscular disorder (HCC)    MS - mild left side spasicity   Vertigo    1 episode.  approx 12/2021.  Lasted 2-3 days.   Past Surgical History:  Procedure Laterality Date   CATARACT EXTRACTION W/PHACO Right 06/28/2023   Procedure: CATARACT EXTRACTION PHACO AND INTRAOCULAR LENS PLACEMENT (IOC) RIGHT  6.17  00:42.8;  Surgeon: Lockie Mola, MD;  Location: [facility];  Service: Ophthalmology;  Laterality: Right;   COLONOSCOPY  06/27/2007   Normal   CYSTOSCOPY/URETEROSCOPY/HOLMIUM LASER/STENT PLACEMENT Right 07/27/2018   Procedure: CYSTOSCOPY/URETEROSCOPY/HOLMIUM LASER/STENT PLACEMENT;  Surgeon: Riki Altes, MD;  Location: [facility];  Service: Urology;  Laterality: Right;   LUMBAR DISC SURGERY     URETEROLITHOTOMY  11/16/2001   US ECHOCARDIOGRAPHY  11/16/2004   normal, Carotids normal   Patient Active Problem List   Diagnosis Date Noted   Early satiety 06/06/2023    Prostate hypertrophy 07/12/2022   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   Hx of adenomatous colonic polyps 09/02/2021   Right optic neuritis 02/04/2021   Lumbar radiculopathy 06/25/2020   Other fatigue 06/25/2020   Lumbar disc herniation 05/01/2020   High risk medication use 11/04/2019   Vitamin D deficiency 11/04/2019   Left foot drop 11/04/2019   Vision disturbance 11/04/2019   Abnormal MRI of head 09/24/2019   Ureteral calculus 07/25/2018   Hydronephrosis with urinary obstruction due to ureteral calculus 07/25/2018   Sensorineural hearing loss (SNHL), bilateral 03/06/2017   Low back pain 05/29/2014   Routine general medical examination at a health care facility 02/23/2012   GERD (gastroesophageal reflux disease)    Hyperlipemia 10/03/2007   Seasonal allergic rhinitis due to pollen 07/19/2006   RENAL CALCULUS 07/19/2006   Multiple sclerosis (HCC) 10/16/2005    PCP: Karie Schwalbe, MD  REFERRING PROVIDER: Clovis Riley, PA-C  REFERRING DIAG: [phone] (ICD-10-CM) - Spondylosis without myelopathy or radiculopathy, lumbar region  Rationale for Evaluation and Treatment: Rehabilitation  THERAPY DIAG:  Other low back pain  Muscle weakness (generalized)  Abnormal posture  Pain in right hip  Difficulty in walking, not elsewhere classified  ONSET DATE: Chronic; at least 2022  SUBJECTIVE:  SUBJECTIVE STATEMENT: My left side is a little angry   PERTINENT HISTORY:  L2-L3 ablation, discectomy, MS  PAIN:  07/18/23 Are you having pain? Yes: NPRS scale: currently 4-5/10,R side,  was a 3/10 in the left shoulder Pain location: low back and lateral hips Pain description: constant, radiating to front of legs Aggravating factors: walking ~2 miles Relieving  factors: heat, lidocaine patch, pain medication     PRECAUTIONS: None  RED FLAGS: None   WEIGHT BEARING RESTRICTIONS: No  FALLS:  Has patient fallen in last 6 months? No  LIVING ENVIRONMENT: Lives with: lives with their family, husband Lives in: House/apartment Stairs: No Has following equipment at home: None  OCCUPATION: Programmer, multimedia  PLOF: Independent  PATIENT GOALS: "I would love to be able to do a moderate workout every day and stretching" (Not sure if the stretching he's doing is helping or hurting). "I want to be more active and feel secure with activity" Wants to learn more about energy conservation for his MS, decrease pain levels in general  NEXT MD VISIT: n/a  OBJECTIVE:  Note: Objective measures were completed at Evaluation unless otherwise noted.  DIAGNOSTIC FINDINGS:  11/29/22 Brain MRI IMPRESSION: This MRI of the brain with and without contrast shows the following: Multiple T2/FLAIR hyperintense foci in the cerebral hemispheres in a pattern consistent with chronic demyelinating plaque associated with multiple sclerosis.  None of the foci enhanced or appear to be acute.  Compared to the MRI from 12/28/2021, there were no new lesions. No acute findings.  Normal enhancement pattern.  PATIENT SURVEYS:  Modified Oswestry Low Back Pain Disability Questionnaire: 20 / 50 = 40.0 %  COGNITION: Overall cognitive status: Within functional limits for tasks assessed     SENSATION: L finger N/T; "drop foot" on L with neuropathy  MUSCLE LENGTH: Did not assess on eval Figure 4 stretch is tighter on R hip in seated vs L hip  POSTURE: rounded shoulders and forward head  PALPATION: TTP L>R with trigger points: UT, mid/low trap, infraspinatus, lats/teres minor and major  TTP L>R glute med and hamstrings with trigger points, R>L QL with trigger points. Did not seem particularly tender with SIJ PAs  LUMBAR ROM:   AROM eval  Flexion 75%  Extension 25% (felt good)  Right  lateral flexion 50%*  Left lateral flexion 30%  Right rotation 75%  Left rotation 75%   (Blank rows = not tested, * = pain)  UPPER EXTREMITY MMT: most painful when asked to place his hands behind his head and reach behind him to check functional external rotation ROM  MMT Right eval Left eval  Shoulder flexion 5 5  Shoulder extension (prone) 5 4  Shoulder abduction 5 5  Shoulder adduction    Shoulder internal rotation 5 5  Shoulder external rotation (sitting) 5 3+  Middle trap (prone) 5 4-  Low trap (prone) 4 3-  Elbow flexion 5 5  Elbow extension 5 5   (Blank rows = not tested)   LOWER EXTREMITY MMT:    MMT Right eval Left eval  Hip flexion 4 3+  Hip extension 4 3+  Hip abduction 4 3+  Hip adduction    Hip internal rotation    Hip external rotation    Knee flexion 4 4-  Knee extension 5 5  Ankle dorsiflexion 5 4  Ankle plantarflexion    Ankle inversion    Ankle eversion     (Blank rows = not tested)  LUMBAR SPECIAL TESTS:  Did not  assess  FUNCTIONAL TESTS:  Plank for core strength TBA  GAIT: Distance walked: Into clinic Assistive device utilized: None Level of assistance: Complete Independence  TREATMENT DATE:  07/24/23: Nustep x 6 min level 6 Snow angel on wall x 10 cues to retract toward the wall  Low trap lift off x 10 cues to maintain Y position Doorway stretch 2 x 30 sec at 90 deg and Y positions B Seated chin tucks x 10 Standing low rows 15# x 10 Standing shoulder ext Blue x 10 Supine PPT with 90/90 heel tap 3x5 PPT with alternating arm and leg 2x5 Standing hamstring stretch 3 x 30 sec Standing quad stretch 3 x 30 sec  07/18/23: Nustep x 5 min level 6 Standing hamstring stretch 3 x 30 sec Standing hamstring stretch 3 x 30 sec Seated piriformis stretch 3 x 30 sec Supine PPT x 20 Supine PPT with 90/90 heel tap x 20 PPT with alternating arm and leg x 20 Lower trunk rotation x 20 Seated mini sit ups with 5 lb kb x 20 Education on anatomy of  the lumbar spine and pelvis, proper posture and body mechanics, pain control   07/12/23:Pt arrives for aquatic physical therapy. Treatment took place in 3.5-5.5 feet of water. Water temperature was 91 degrees F. Pt entered the pool via independently with mild use of rails. Pt requires buoyancy of water for support and to offload joints with strengthening exercises.  Pt utilizes viscosity of the water required for strengthening. Seated water bench with 75% submersion Pt performed seated LE AROM exercises 20x in all planes, concurrent discussion/education on water principles and how to use them as well as pt's current status and goals. 75% depth water walking 4x in each direction. Heel lifts 10x. Hip flex/ext 10x Bil and abduction Bil 10x. VC for control Static marching min asst of pool wall for balance 10x Bil. Seated decompression with large noodle behind patient. PTA providing min asst for trunk control.  07/11/23                                                                                                                              See HEP below   PATIENT EDUCATION:  Education details:HEP UPDATE Person educated: Patient Education method: Explanation, Demonstration, and Handouts Education comprehension: verbalized understanding, returned demonstration, and needs further education  HOME EXERCISE PROGRAM: Access Code: [email or web address] URL: [email or web address] Date: 07/18/2023 Prepared by: Mikey Kirschner  Exercises - Child's Pose with Sidebending  - 1 x daily - 7 x weekly - 2 sets - 30 sec hold - Seated Piriformis Stretch  - 1 x daily - 7 x weekly - 2 sets - 30 sec hold - Seated Figure 4 Piriformis Stretch  - 1 x daily - 7 x weekly - 2 sets - 30 sec hold - Supine Figure 4  - 1 x daily - 7 x weekly - 2 sets - 30 sec hold - Supine Piriformis Stretch with Foot  on Ground  - 1 x daily - 7 x weekly - 2 sets - 30 sec hold - Standing Hamstring Stretch on Chair  - 1 x daily - 7 x weekly  - 1 sets - 3 reps - 30 sec hold - Quadricep Stretch with Chair and Counter Support  - 1 x daily - 7 x weekly - 1 sets - 3 reps - 30 sec hold - Supine Posterior Pelvic Tilt  - 1 x daily - 7 x weekly - 1 sets - 20 reps - Supine 90/90 Alternating Heel Touches with Posterior Pelvic Tilt  - 1 x daily - 7 x weekly - 1 sets - 20 reps - Dead Bug  - 1 x daily - 7 x weekly - 1 sets - 20 reps  ASSESSMENT:  CLINICAL IMPRESSION: Patient reports compliance with HEP. He was haing some difficulty with 90/90 position exercises primarily due to spasticity in LLE. We modified reps and sets which was more tolerable to patient. We focused more on L shoulder today and intiated HEP for the upper quadrants and postural muscles.      OBJECTIVE IMPAIRMENTS: decreased activity tolerance, decreased coordination, decreased endurance, decreased mobility, difficulty walking, decreased ROM, decreased strength, increased fascial restrictions, increased muscle spasms, impaired flexibility, impaired sensation, impaired tone, impaired UE functional use, improper body mechanics, postural dysfunction, and pain.   ACTIVITY LIMITATIONS: lifting, bending, standing, squatting, sleeping, reach over head, and locomotion level  PARTICIPATION LIMITATIONS: meal prep, cleaning, laundry, shopping, community activity, occupation, and yard work  PERSONAL FACTORS: Age, Fitness, Past/current experiences, Profession, Time since onset of injury/illness/exacerbation, and 1 comorbidity: MS  are also affecting patient's functional outcome.   REHAB POTENTIAL: Good  CLINICAL DECISION MAKING: Evolving/moderate complexity  EVALUATION COMPLEXITY: Moderate   GOALS: Goals reviewed with patient? Yes  SHORT TERM GOALS: Target date: 08/08/2023   Pt will be ind with initial HEP Baseline: Goal status: INITIAL  2.  Pt will have improved lumbar ROM by at least 10% with no pain in all directions Baseline:  Goal status: INITIAL  3.  Pt will be able  to reach behind his head/down his back without L shoulder pain  Baseline:  Goal status: INITIAL  4.  Pt will report decrease in overall pain by at least 25% with activities Baseline:  Goal status: INITIAL   LONG TERM GOALS: Target date: 09/05/2023   Pt will be ind with management and progression of HEP to transition to community wellness/gym activities again Baseline:  Goal status: INITIAL  2.  Pt will demo at least 4/5 L posterior shoulder strength for improved UE movement/endurance for conducting Baseline:  Goal status: INITIAL  3.  Pt will demo at least a 1/2 muscle grade increase with bilat LE strength to improve standing and walking tolerance Baseline:  Goal status: INITIAL  4.  Pt will be able to lift and carry at least 20# with manageable back pain for home and community tasks Baseline:  Goal status: INITIAL  5.  Pt will have improved Modified Oswestry to </=30% to demo MCID Baseline:  Goal status: INITIAL   PLAN:  PT FREQUENCY: 2x/week  PT DURATION: 8 weeks  PLANNED INTERVENTIONS: 97164- PT Re-evaluation, 97110-Therapeutic exercises, 97530- Therapeutic activity, 97112- Neuromuscular re-education, 97535- Self Care, 16109- Manual therapy, [phone]- Gait training, [phone]- Aquatic Therapy, [phone]- Electrical stimulation (unattended), [id number]- Ultrasound, [id number]- Traction (mechanical), [id number]- Ionotophoresis 4mg /ml Dexamethasone, Patient/Family education, Balance training, Stair training, Taping, Dry Needling, Joint mobilization, Spinal mobilization, Cryotherapy, and Moist heat.  PLAN FOR  NEXT SESSION: [place]: Progress HEP as indicated. Manual therapy/dry needling as indicated for L periscapular muscles and glute med, R QL (pt is interested in dry needling). continue posterior shoulder girdle strengthening/stretching. Continue to stretch and strengthen hips/core. May need further SIJ assessment.   Solon Palm, PT  07/24/23 1:22 PM [facility] [address], [address] [place], [place] [address] Phone # [phone] Fax [phone]

## 2023-07-24 ENCOUNTER — Ambulatory Visit: Admitting: Physical Therapy

## 2023-07-24 ENCOUNTER — Encounter: Payer: Self-pay | Admitting: Physical Therapy

## 2023-07-24 DIAGNOSIS — M5459 Other low back pain: Secondary | ICD-10-CM | POA: Diagnosis not present

## 2023-07-24 DIAGNOSIS — R262 Difficulty in walking, not elsewhere classified: Secondary | ICD-10-CM

## 2023-07-24 DIAGNOSIS — M25551 Pain in right hip: Secondary | ICD-10-CM | POA: Diagnosis not present

## 2023-07-24 DIAGNOSIS — R252 Cramp and spasm: Secondary | ICD-10-CM | POA: Diagnosis not present

## 2023-07-24 DIAGNOSIS — R293 Abnormal posture: Secondary | ICD-10-CM | POA: Diagnosis not present

## 2023-07-24 DIAGNOSIS — M25512 Pain in left shoulder: Secondary | ICD-10-CM | POA: Diagnosis not present

## 2023-07-24 DIAGNOSIS — G8929 Other chronic pain: Secondary | ICD-10-CM | POA: Diagnosis not present

## 2023-07-24 DIAGNOSIS — M25552 Pain in left hip: Secondary | ICD-10-CM | POA: Diagnosis not present

## 2023-07-24 DIAGNOSIS — M6281 Muscle weakness (generalized): Secondary | ICD-10-CM

## 2023-07-25 LAB — SURGICAL PATHOLOGY

## 2023-07-27 ENCOUNTER — Encounter: Payer: Self-pay | Admitting: Internal Medicine

## 2023-07-27 ENCOUNTER — Other Ambulatory Visit: Payer: Self-pay

## 2023-07-27 DIAGNOSIS — A048 Other specified bacterial intestinal infections: Secondary | ICD-10-CM

## 2023-07-27 MED ORDER — METRONIDAZOLE 250 MG PO TABS: 250.0000 mg | ORAL_TABLET | Freq: Four times a day (QID) | ORAL | 0 refills | Status: AC

## 2023-07-27 MED ORDER — BISMUTH SUBSALICYLATE 262 MG PO TABS: 2.0000 | ORAL_TABLET | Freq: Four times a day (QID) | ORAL | 0 refills | Status: AC

## 2023-07-27 MED ORDER — DOXYCYCLINE HYCLATE 100 MG PO CAPS: 100.0000 mg | ORAL_CAPSULE | Freq: Two times a day (BID) | ORAL | 0 refills | Status: AC

## 2023-07-27 NOTE — Progress Notes (Signed)
 Patient has H pylori gastritis  He needs to be contacted from the office and instructed that he has H pylori gastritis - a stomach infection that can cause his symptoms.  1) Omeprazole 20 mg 2 times a day x 14 d 2) Pepto Bismol 2 tabs (262 mg each) 4 times a day x 14 d 3) Metronidazole 250 mg 4 times a day x 14 d 4) doxycycline 100 mg 2 times a day x 14 d  May go back to once daily omeprazole which he has taken for GERD  In 4 weeks after treatment completed do H. Pylori stool antigen by  [facility] - dx H. Pylori gastritis

## 2023-07-28 ENCOUNTER — Telehealth: Payer: Self-pay | Admitting: Internal Medicine

## 2023-07-28 NOTE — Telephone Encounter (Signed)
 Patient returning phone call regarding recent results. Please advise.

## 2023-07-28 NOTE — Telephone Encounter (Signed)
 See results note.

## 2023-07-31 ENCOUNTER — Other Ambulatory Visit: Payer: Self-pay

## 2023-07-31 NOTE — Telephone Encounter (Signed)
 Patient returning phone call

## 2023-07-31 NOTE — Telephone Encounter (Signed)
 Patient returning phone call regarding recent results. Please advise, thank you.

## 2023-07-31 NOTE — Telephone Encounter (Signed)
 See results note.

## 2023-08-01 ENCOUNTER — Ambulatory Visit

## 2023-08-01 DIAGNOSIS — G8929 Other chronic pain: Secondary | ICD-10-CM

## 2023-08-01 DIAGNOSIS — M6281 Muscle weakness (generalized): Secondary | ICD-10-CM | POA: Diagnosis not present

## 2023-08-01 DIAGNOSIS — M5459 Other low back pain: Secondary | ICD-10-CM

## 2023-08-01 DIAGNOSIS — R262 Difficulty in walking, not elsewhere classified: Secondary | ICD-10-CM

## 2023-08-01 DIAGNOSIS — M25552 Pain in left hip: Secondary | ICD-10-CM

## 2023-08-01 DIAGNOSIS — R252 Cramp and spasm: Secondary | ICD-10-CM | POA: Diagnosis not present

## 2023-08-01 DIAGNOSIS — R293 Abnormal posture: Secondary | ICD-10-CM | POA: Diagnosis not present

## 2023-08-01 DIAGNOSIS — M25512 Pain in left shoulder: Secondary | ICD-10-CM | POA: Diagnosis not present

## 2023-08-01 DIAGNOSIS — M25551 Pain in right hip: Secondary | ICD-10-CM | POA: Diagnosis not present

## 2023-08-01 NOTE — Therapy (Signed)
 OUTPATIENT PHYSICAL THERAPY THORACOLUMBAR TREATMENT NOTE   Patient Name: Cameron [name] MRN: [id number] DOB:[birth year], 68 y.o., male Today's Date: 08/01/2023  END OF SESSION:  PT End of Session - 08/01/23 0933     Visit Number 5    Number of Visits 16    Authorization Type [employer]    PT Start Time [phone]    PT Stop Time 1015    PT Time Calculation (min) 42 min    Activity Tolerance Patient tolerated treatment well    Behavior During Therapy [facility] for tasks assessed/performed                Past Medical History:  Diagnosis Date   Abnormal MRI 2007   nonspecific white matter abnormalities   Allergy    seasonal   Anxiety    no meds   Arthritis    spine   Cataract    GERD (gastroesophageal reflux disease)    Helicobacter pylori gastritis 07/20/2023        History of kidney stones    x 2 episodes- last 2020   Hx of adenomatous colonic polyps 09/02/2021   2 diminutive adenomas recall 2030   Idiopathic scoliosis    Migraines    OCULAR connected to MS   Multiple sclerosis (HCC)    Neuromuscular disorder (HCC)    MS - mild left side spasicity   Vertigo    1 episode.  approx 12/2021.  Lasted 2-3 days.   Past Surgical History:  Procedure Laterality Date   CATARACT EXTRACTION W/PHACO Right 06/28/2023   Procedure: CATARACT EXTRACTION PHACO AND INTRAOCULAR LENS PLACEMENT (IOC) RIGHT  6.17  00:42.8;  Surgeon: Lockie Mola, MD;  Location: [facility];  Service: Ophthalmology;  Laterality: Right;   COLONOSCOPY  06/27/2007   Normal   CYSTOSCOPY/URETEROSCOPY/HOLMIUM LASER/STENT PLACEMENT Right 07/27/2018   Procedure: CYSTOSCOPY/URETEROSCOPY/HOLMIUM LASER/STENT PLACEMENT;  Surgeon: Riki Altes, MD;  Location: [facility];  Service: Urology;  Laterality: Right;   LUMBAR DISC SURGERY     URETEROLITHOTOMY  11/16/2001   US ECHOCARDIOGRAPHY  11/16/2004   normal, Carotids normal   Patient Active Problem List   Diagnosis Date Noted   Helicobacter  pylori gastritis 07/20/2023   Early satiety 06/06/2023   Prostate hypertrophy 07/12/2022   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   Hx of adenomatous colonic polyps 09/02/2021   Right optic neuritis 02/04/2021   Lumbar radiculopathy 06/25/2020   Other fatigue 06/25/2020   Lumbar disc herniation 05/01/2020   High risk medication use 11/04/2019   Vitamin D deficiency 11/04/2019   Left foot drop 11/04/2019   Vision disturbance 11/04/2019   Abnormal MRI of head 09/24/2019   Ureteral calculus 07/25/2018   Hydronephrosis with urinary obstruction due to ureteral calculus 07/25/2018   Sensorineural hearing loss (SNHL), bilateral 03/06/2017   Low back pain 05/29/2014   Routine general medical examination at a health care facility 02/23/2012   GERD (gastroesophageal reflux disease)    Hyperlipemia 10/03/2007   Seasonal allergic rhinitis due to pollen 07/19/2006   RENAL CALCULUS 07/19/2006   Multiple sclerosis (HCC) 10/16/2005    PCP: Karie Schwalbe, MD  REFERRING PROVIDER: Clovis Riley, PA-C  REFERRING DIAG: [phone] (ICD-10-CM) - Spondylosis without myelopathy or radiculopathy, lumbar region  Rationale for Evaluation and Treatment: Rehabilitation  THERAPY DIAG:  Other low back pain  Muscle weakness (generalized)  Cramp and spasm  Abnormal posture  Pain in right hip  Difficulty in walking, not elsewhere classified  Pain in  left hip  Chronic left shoulder pain  ONSET DATE: Chronic; at least 2022  SUBJECTIVE:                                                                                                                                                                                           SUBJECTIVE STATEMENT: Doing pretty good.  "My left shoulder and neck are painful today"  PERTINENT HISTORY:  L2-L3 ablation, discectomy, MS  PAIN:  08/01/23 Are you having pain? Yes: NPRS scale: currently 4-5/10,R side,  was a 3/10 in the  left shoulder Pain location: low back and lateral hips Pain description: constant, radiating to front of legs Aggravating factors: walking ~2 miles Relieving factors: heat, lidocaine patch, pain medication     PRECAUTIONS: None  RED FLAGS: None   WEIGHT BEARING RESTRICTIONS: No  FALLS:  Has patient fallen in last 6 months? No  LIVING ENVIRONMENT: Lives with: lives with their family, husband Lives in: House/apartment Stairs: No Has following equipment at home: None  OCCUPATION: Programmer, multimedia  PLOF: Independent  PATIENT GOALS: "I would love to be able to do a moderate workout every day and stretching" (Not sure if the stretching he's doing is helping or hurting). "I want to be more active and feel secure with activity" Wants to learn more about energy conservation for his MS, decrease pain levels in general  NEXT MD VISIT: n/a  OBJECTIVE:  Note: Objective measures were completed at Evaluation unless otherwise noted.  DIAGNOSTIC FINDINGS:  11/29/22 Brain MRI IMPRESSION: This MRI of the brain with and without contrast shows the following: Multiple T2/FLAIR hyperintense foci in the cerebral hemispheres in a pattern consistent with chronic demyelinating plaque associated with multiple sclerosis.  None of the foci enhanced or appear to be acute.  Compared to the MRI from 12/28/2021, there were no new lesions. No acute findings.  Normal enhancement pattern.  PATIENT SURVEYS:  Modified Oswestry Low Back Pain Disability Questionnaire: 20 / 50 = 40.0 %  COGNITION: Overall cognitive status: Within functional limits for tasks assessed     SENSATION: L finger N/T; "drop foot" on L with neuropathy  MUSCLE LENGTH: Did not assess on eval Figure 4 stretch is tighter on R hip in seated vs L hip  POSTURE: rounded shoulders and forward head  PALPATION: TTP L>R with trigger points: UT, mid/low trap, infraspinatus, lats/teres minor and major  TTP L>R glute med and hamstrings with trigger  points, R>L QL with trigger points. Did not seem particularly tender with SIJ PAs  LUMBAR ROM:   AROM eval  Flexion 75%  Extension 25% (felt good)  Right lateral flexion 50%*  Left lateral flexion 30%  Right rotation 75%  Left rotation 75%   (Blank rows = not tested, * = pain)  UPPER EXTREMITY MMT: most painful when asked to place his hands behind his head and reach behind him to check functional external rotation ROM  MMT Right eval Left eval  Shoulder flexion 5 5  Shoulder extension (prone) 5 4  Shoulder abduction 5 5  Shoulder adduction    Shoulder internal rotation 5 5  Shoulder external rotation (sitting) 5 3+  Middle trap (prone) 5 4-  Low trap (prone) 4 3-  Elbow flexion 5 5  Elbow extension 5 5   (Blank rows = not tested)   LOWER EXTREMITY MMT:    MMT Right eval Left eval  Hip flexion 4 3+  Hip extension 4 3+  Hip abduction 4 3+  Hip adduction    Hip internal rotation    Hip external rotation    Knee flexion 4 4-  Knee extension 5 5  Ankle dorsiflexion 5 4  Ankle plantarflexion    Ankle inversion    Ankle eversion     (Blank rows = not tested)  LUMBAR SPECIAL TESTS:  Did not assess  FUNCTIONAL TESTS:  Plank for core strength TBA  GAIT: Distance walked: Into clinic Assistive device utilized: None Level of assistance: Complete Independence  TREATMENT DATE:  08/01/23: Nustep x 6 min level 6 3 way scapular stabilization with blue loop x 10 each UE 4D ball rolls with 2 lb plyo ball (light blue) x 20 each direction on each UE Standing bilateral shoulder ER with red tband x 20 Standing bilateral shoulder horizontal abduction x 20 with red tband Prone left shoulder extension with 2 lbs 2 x 10 Prone left shoulder row with 2 lb 2 x 10 Prone left shoulder horizontal abduction with 2 lb 2 x 10 Side lying left shoulder ER 2 x 10 with 2 lbs Supine serratus punch with 2 lbs 2 x 10 Supine shoulder alphabet with 2 lb  07/24/23: Nustep x 6 min level  6 Snow angel on wall x 10 cues to retract toward the wall  Low trap lift off x 10 cues to maintain Y position Doorway stretch 2 x 30 sec at 90 deg and Y positions B Seated chin tucks x 10 Standing low rows 15# x 10 Standing shoulder ext Blue x 10 Supine PPT with 90/90 heel tap 3x5 PPT with alternating arm and leg 2x5 Standing hamstring stretch 3 x 30 sec Standing quad stretch 3 x 30 sec  07/18/23: Nustep x 5 min level 6 Standing hamstring stretch 3 x 30 sec Standing hamstring stretch 3 x 30 sec Seated piriformis stretch 3 x 30 sec Supine PPT x 20 Supine PPT with 90/90 heel tap x 20 PPT with alternating arm and leg x 20 Lower trunk rotation x 20 Seated mini sit ups with 5 lb kb x 20 Education on anatomy of the lumbar spine and pelvis, proper posture and body mechanics, pain control   07/12/23:Pt arrives for aquatic physical therapy. Treatment took place in 3.5-5.5 feet of water. Water temperature was 91 degrees F. Pt entered the pool via independently with mild use of rails. Pt requires buoyancy of water for support and to offload joints with strengthening exercises.  Pt utilizes viscosity of the water required for strengthening. Seated water bench with 75% submersion Pt performed seated LE AROM exercises 20x in all planes, concurrent discussion/education on water principles and how to use them as  well as pt's current status and goals. 75% depth water walking 4x in each direction. Heel lifts 10x. Hip flex/ext 10x Bil and abduction Bil 10x. VC for control Static marching min asst of pool wall for balance 10x Bil. Seated decompression with large noodle behind patient. PTA providing min asst for trunk control.  07/11/23                                                                                                                              See HEP below   PATIENT EDUCATION:  Education details:HEP UPDATE Person educated: Patient Education method: Explanation, Demonstration, and  Handouts Education comprehension: verbalized understanding, returned demonstration, and needs further education  HOME EXERCISE PROGRAM: Access Code: [email or web address] URL: [email or web address] Date: 07/18/2023 Prepared by: Mikey Kirschner  Exercises - Child's Pose with Sidebending  - 1 x daily - 7 x weekly - 2 sets - 30 sec hold - Seated Piriformis Stretch  - 1 x daily - 7 x weekly - 2 sets - 30 sec hold - Seated Figure 4 Piriformis Stretch  - 1 x daily - 7 x weekly - 2 sets - 30 sec hold - Supine Figure 4  - 1 x daily - 7 x weekly - 2 sets - 30 sec hold - Supine Piriformis Stretch with Foot on Ground  - 1 x daily - 7 x weekly - 2 sets - 30 sec hold - Standing Hamstring Stretch on Chair  - 1 x daily - 7 x weekly - 1 sets - 3 reps - 30 sec hold - Quadricep Stretch with Chair and Counter Support  - 1 x daily - 7 x weekly - 1 sets - 3 reps - 30 sec hold - Supine Posterior Pelvic Tilt  - 1 x daily - 7 x weekly - 1 sets - 20 reps - Supine 90/90 Alternating Heel Touches with Posterior Pelvic Tilt  - 1 x daily - 7 x weekly - 1 sets - 20 reps - Dead Bug  - 1 x daily - 7 x weekly - 1 sets - 20 reps  ASSESSMENT:  CLINICAL IMPRESSION: Annette Stable continues to demonstrate left shoulder compensatory pattern with shoulder elevation.  The left shoulder fatigues quite quickly with any postural exercises.  We added in scapular stabilization and rotator cuff strengthening today as this may be contributing to her left side neck issues.  When isolating ER, he fatigued very quickly.  He had to stop for rest breaks every 3-4 reps on side lying ER.  He is compliant and well motivated.  He would benefit from continuing skilled PT for postural strengthening and core stabilization.  Patient reminded to continue to take "posture checks"  anytime he walks by a reflection to avoid fwd head and rounded shoulders.       OBJECTIVE IMPAIRMENTS: decreased activity tolerance, decreased coordination, decreased endurance,  decreased mobility, difficulty walking, decreased ROM, decreased strength, increased fascial restrictions, increased muscle spasms, impaired flexibility, impaired  sensation, impaired tone, impaired UE functional use, improper body mechanics, postural dysfunction, and pain.   ACTIVITY LIMITATIONS: lifting, bending, standing, squatting, sleeping, reach over head, and locomotion level  PARTICIPATION LIMITATIONS: meal prep, cleaning, laundry, shopping, community activity, occupation, and yard work  PERSONAL FACTORS: Age, Fitness, Past/current experiences, Profession, Time since onset of injury/illness/exacerbation, and 1 comorbidity: MS  are also affecting patient's functional outcome.   REHAB POTENTIAL: Good  CLINICAL DECISION MAKING: Evolving/moderate complexity  EVALUATION COMPLEXITY: Moderate   GOALS: Goals reviewed with patient? Yes  SHORT TERM GOALS: Target date: 08/08/2023   Pt will be ind with initial HEP Baseline: Goal status: INITIAL  2.  Pt will have improved lumbar ROM by at least 10% with no pain in all directions Baseline:  Goal status: INITIAL  3.  Pt will be able to reach behind his head/down his back without L shoulder pain  Baseline:  Goal status: INITIAL  4.  Pt will report decrease in overall pain by at least 25% with activities Baseline:  Goal status: INITIAL   LONG TERM GOALS: Target date: 09/05/2023   Pt will be ind with management and progression of HEP to transition to community wellness/gym activities again Baseline:  Goal status: INITIAL  2.  Pt will demo at least 4/5 L posterior shoulder strength for improved UE movement/endurance for conducting Baseline:  Goal status: INITIAL  3.  Pt will demo at least a 1/2 muscle grade increase with bilat LE strength to improve standing and walking tolerance Baseline:  Goal status: INITIAL  4.  Pt will be able to lift and carry at least 20# with manageable back pain for home and community tasks Baseline:   Goal status: INITIAL  5.  Pt will have improved Modified Oswestry to </=30% to demo MCID Baseline:  Goal status: INITIAL   PLAN:  PT FREQUENCY: 2x/week  PT DURATION: 8 weeks  PLANNED INTERVENTIONS: 97164- PT Re-evaluation, 97110-Therapeutic exercises, 97530- Therapeutic activity, 97112- Neuromuscular re-education, 97535- Self Care, 16109- Manual therapy, [phone]- Gait training, [phone]- Aquatic Therapy, [phone]- Electrical stimulation (unattended), [id number]- Ultrasound, [id number]- Traction (mechanical), [id number]- Ionotophoresis 4mg /ml Dexamethasone, Patient/Family education, Balance training, Stair training, Taping, Dry Needling, Joint mobilization, Spinal mobilization, Cryotherapy, and Moist heat.  PLAN FOR NEXT SESSION: [place]: Progress HEP as indicated. Manual therapy/dry needling as indicated for L periscapular muscles and glute med, R QL (pt is interested in dry needling). continue posterior shoulder girdle strengthening/stretching. Continue to stretch and strengthen hips/core. May need further SIJ assessment.   [facility] B. Chaitra Mast, PT 08/01/23 9:22 PM [facility] [address] [place], [place] [address] Phone # [phone] Fax [phone]

## 2023-08-01 NOTE — Therapy (Unsigned)
 OUTPATIENT PHYSICAL THERAPY THORACOLUMBAR TREATMENT NOTE   Patient Name: Cameron [name] MRN: [id number] DOB:12/26/1955, 68 y.o., male Today's Date: 08/02/2023  END OF SESSION:  PT End of Session - 08/02/23 0858     Visit Number 6    Number of Visits 16    Date for PT Re-Evaluation 09/05/23    Authorization Type [employer]    PT Start Time [phone]    PT Stop Time 0930    PT Time Calculation (min) 44 min    Activity Tolerance Patient tolerated treatment well    Behavior During Therapy [facility] for tasks assessed/performed                 Past Medical History:  Diagnosis Date   Abnormal MRI 2007   nonspecific white matter abnormalities   Allergy    seasonal   Anxiety    no meds   Arthritis    spine   Cataract    GERD (gastroesophageal reflux disease)    Helicobacter pylori gastritis 07/20/2023        History of kidney stones    x 2 episodes- last 2020   Hx of adenomatous colonic polyps 09/02/2021   2 diminutive adenomas recall 2030   Idiopathic scoliosis    Migraines    OCULAR connected to MS   Multiple sclerosis (HCC)    Neuromuscular disorder (HCC)    MS - mild left side spasicity   Vertigo    1 episode.  approx 12/2021.  Lasted 2-3 days.   Past Surgical History:  Procedure Laterality Date   CATARACT EXTRACTION W/PHACO Right 06/28/2023   Procedure: CATARACT EXTRACTION PHACO AND INTRAOCULAR LENS PLACEMENT (IOC) RIGHT  6.17  00:42.8;  Surgeon: Annell Kidney, MD;  Location: [facility];  Service: Ophthalmology;  Laterality: Right;   COLONOSCOPY  06/27/2007   Normal   CYSTOSCOPY/URETEROSCOPY/HOLMIUM LASER/STENT PLACEMENT Right 07/27/2018   Procedure: CYSTOSCOPY/URETEROSCOPY/HOLMIUM LASER/STENT PLACEMENT;  Surgeon: Geraline Knapp, MD;  Location: [facility];  Service: Urology;  Laterality: Right;   LUMBAR DISC SURGERY     URETEROLITHOTOMY  11/16/2001   US  ECHOCARDIOGRAPHY  11/16/2004   normal, Carotids normal   Patient Active Problem List    Diagnosis Date Noted   Helicobacter pylori gastritis 07/20/2023   Early satiety 06/06/2023   Prostate hypertrophy 07/12/2022   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   Hx of adenomatous colonic polyps 09/02/2021   Right optic neuritis 02/04/2021   Lumbar radiculopathy 06/25/2020   Other fatigue 06/25/2020   Lumbar disc herniation 05/01/2020   High risk medication use 11/04/2019   Vitamin D deficiency 11/04/2019   Left foot drop 11/04/2019   Vision disturbance 11/04/2019   Abnormal MRI of head 09/24/2019   Ureteral calculus 07/25/2018   Hydronephrosis with urinary obstruction due to ureteral calculus 07/25/2018   Sensorineural hearing loss (SNHL), bilateral 03/06/2017   Low back pain 05/29/2014   Routine general medical examination at a health care facility 02/23/2012   GERD (gastroesophageal reflux disease)    Hyperlipemia 10/03/2007   Seasonal allergic rhinitis due to pollen 07/19/2006   RENAL CALCULUS 07/19/2006   Multiple sclerosis (HCC) 10/16/2005    PCP: Helaine Llanos, MD  REFERRING PROVIDER: Assunta Lax, PA-C  REFERRING DIAG: [phone] (ICD-10-CM) - Spondylosis without myelopathy or radiculopathy, lumbar region  Rationale for Evaluation and Treatment: Rehabilitation  THERAPY DIAG:  Other low back pain  Muscle weakness (generalized)  Cramp and spasm  Abnormal posture  Pain in right hip  Difficulty in walking, not elsewhere classified  Pain in left hip  Other abnormalities of gait and mobility  Chronic left shoulder pain  ONSET DATE: Chronic; at least 2022  SUBJECTIVE:                                                                                                                                                                                           SUBJECTIVE STATEMENT: Doing well overall. I overstretched a little on the left shoulder but it is ok. My back feels better.  PERTINENT HISTORY:  L2-L3 ablation,  discectomy, MS  PAIN:  07/18/23 Are you having pain? Left shoulder is a little touchy from overstretching it.      PRECAUTIONS: None  RED FLAGS: None   WEIGHT BEARING RESTRICTIONS: No  FALLS:  Has patient fallen in last 6 months? No  LIVING ENVIRONMENT: Lives with: lives with their family, husband Lives in: House/apartment Stairs: No Has following equipment at home: None  OCCUPATION: Programmer, multimedia  PLOF: Independent  PATIENT GOALS: "I would love to be able to do a moderate workout every day and stretching" (Not sure if the stretching he's doing is helping or hurting). "I want to be more active and feel secure with activity" Wants to learn more about energy conservation for his MS, decrease pain levels in general  NEXT MD VISIT: n/a  OBJECTIVE:  Note: Objective measures were completed at Evaluation unless otherwise noted.  DIAGNOSTIC FINDINGS:  11/29/22 Brain MRI IMPRESSION: This MRI of the brain with and without contrast shows the following: Multiple T2/FLAIR hyperintense foci in the cerebral hemispheres in a pattern consistent with chronic demyelinating plaque associated with multiple sclerosis.  None of the foci enhanced or appear to be acute.  Compared to the MRI from 12/28/2021, there were no new lesions. No acute findings.  Normal enhancement pattern.  PATIENT SURVEYS:  Modified Oswestry Low Back Pain Disability Questionnaire: 20 / 50 = 40.0 %  COGNITION: Overall cognitive status: Within functional limits for tasks assessed     SENSATION: L finger N/T; "drop foot" on L with neuropathy  MUSCLE LENGTH: Did not assess on eval Figure 4 stretch is tighter on R hip in seated vs L hip  POSTURE: rounded shoulders and forward head  PALPATION: TTP L>R with trigger points: UT, mid/low trap, infraspinatus, lats/teres minor and major  TTP L>R glute med and hamstrings with trigger points, R>L QL with trigger points. Did not seem particularly tender with SIJ PAs  LUMBAR ROM:    AROM eval  Flexion 75%  Extension 25% (felt good)  Right lateral flexion 50%*  Left lateral flexion 30%  Right rotation  75%  Left rotation 75%   (Blank rows = not tested, * = pain)  UPPER EXTREMITY MMT: most painful when asked to place his hands behind his head and reach behind him to check functional external rotation ROM  MMT Right eval Left eval  Shoulder flexion 5 5  Shoulder extension (prone) 5 4  Shoulder abduction 5 5  Shoulder adduction    Shoulder internal rotation 5 5  Shoulder external rotation (sitting) 5 3+  Middle trap (prone) 5 4-  Low trap (prone) 4 3-  Elbow flexion 5 5  Elbow extension 5 5   (Blank rows = not tested)   LOWER EXTREMITY MMT:    MMT Right eval Left eval  Hip flexion 4 3+  Hip extension 4 3+  Hip abduction 4 3+  Hip adduction    Hip internal rotation    Hip external rotation    Knee flexion 4 4-  Knee extension 5 5  Ankle dorsiflexion 5 4  Ankle plantarflexion    Ankle inversion    Ankle eversion     (Blank rows = not tested)  LUMBAR SPECIAL TESTS:  Did not assess  FUNCTIONAL TESTS:  Plank for core strength TBA  GAIT: Distance walked: Into clinic Assistive device utilized: None Level of assistance: Complete Independence  TREATMENT DATE:  08/02/23:Pt arrives for aquatic physical therapy. Treatment took place in 3.5-5.5 feet of water. Water temperature was 91 degrees F. Pt entered the pool via independently with mild use of rails. Pt requires buoyancy of water for support and to offload joints with strengthening exercises.  Pt utilizes viscosity of the wate.r required for strengthening. Seated water bench with 75% submersion Pt performed seated LE AROM exercises 20x in all planes, concurrent discussion/education on water principles and how to use them as well as pt's current status and goals. 75% depth water walking 8x in each direction with running arms. Heel lifts 15x. Hip flex/ext 15x Bil and abduction Bil 15x. VC for  control Static marching min asst of pool wall for balance 15x Bil. Standing static with arms forward and back 20x, VC to be still. Seated decompression with large noodle behind.Horseback underwater bicycle 3 min.    08/01/23: Nustep x 6 min level 6 3 way scapular stabilization with blue loop x 10 each UE 4D ball rolls with 2 lb plyo ball (light blue) x 20 each direction on each UE Standing bilateral shoulder ER with red tband x 20 Standing bilateral shoulder horizontal abduction x 20 with red tband Prone left shoulder extension with 2 lbs 2 x 10 Prone left shoulder row with 2 lb 2 x 10 Prone left shoulder horizontal abduction with 2 lb 2 x 10 Side lying left shoulder ER 2 x 10 with 2 lbs Supine serratus punch with 2 lbs 2 x 10 Supine shoulder alphabet with 2 lb  07/24/23: Nustep x 6 min level 6 Snow angel on wall x 10 cues to retract toward the wall  Low trap lift off x 10 cues to maintain Y position Doorway stretch 2 x 30 sec at 90 deg and Y positions B Seated chin tucks x 10 Standing low rows 15# x 10 Standing shoulder ext Blue x 10 Supine PPT with 90/90 heel tap 3x5 PPT with alternating arm and leg 2x5 Standing hamstring stretch 3 x 30 sec Standing quad stretch 3 x 30 sec  PATIENT EDUCATION:  Education details:HEP UPDATE Person educated: Patient Education method: Explanation, Demonstration, and Handouts Education comprehension: verbalized understanding, returned  demonstration, and needs further education  HOME EXERCISE PROGRAM: Access Code: [email or web address] URL: [email or web address] Date: 07/18/2023 Prepared by: Mikey Kirschner  Exercises - Child's Pose with Sidebending  - 1 x daily - 7 x weekly - 2 sets - 30 sec hold - Seated Piriformis Stretch  - 1 x daily - 7 x weekly - 2 sets - 30 sec hold - Seated Figure 4 Piriformis Stretch  - 1 x daily - 7 x weekly - 2 sets - 30 sec hold - Supine Figure 4  - 1 x daily - 7 x weekly - 2 sets - 30 sec hold - Supine Piriformis  Stretch with Foot on Ground  - 1 x daily - 7 x weekly - 2 sets - 30 sec hold - Standing Hamstring Stretch on Chair  - 1 x daily - 7 x weekly - 1 sets - 3 reps - 30 sec hold - Quadricep Stretch with Chair and Counter Support  - 1 x daily - 7 x weekly - 1 sets - 3 reps - 30 sec hold - Supine Posterior Pelvic Tilt  - 1 x daily - 7 x weekly - 1 sets - 20 reps - Supine 90/90 Alternating Heel Touches with Posterior Pelvic Tilt  - 1 x daily - 7 x weekly - 1 sets - 20 reps - Dead Bug  - 1 x daily - 7 x weekly - 1 sets - 20 reps  ASSESSMENT:  CLINICAL IMPRESSION:  Annette Stable is doing very well with his aquatic exercises. He is planning to continue with water exercises at the [facility] when he completes his rehab. He does need some reminders to rest to manage fatigue. Pt tolerated all increases in workload very well, we will need to monitor his fatigue post session.  OBJECTIVE IMPAIRMENTS: decreased activity tolerance, decreased coordination, decreased endurance, decreased mobility, difficulty walking, decreased ROM, decreased strength, increased fascial restrictions, increased muscle spasms, impaired flexibility, impaired sensation, impaired tone, impaired UE functional use, improper body mechanics, postural dysfunction, and pain.   ACTIVITY LIMITATIONS: lifting, bending, standing, squatting, sleeping, reach over head, and locomotion level  PARTICIPATION LIMITATIONS: meal prep, cleaning, laundry, shopping, community activity, occupation, and yard work  PERSONAL FACTORS: Age, Fitness, Past/current experiences, Profession, Time since onset of injury/illness/exacerbation, and 1 comorbidity: MS  are also affecting patient's functional outcome.   REHAB POTENTIAL: Good  CLINICAL DECISION MAKING: Evolving/moderate complexity  EVALUATION COMPLEXITY: Moderate   GOALS: Goals reviewed with patient? Yes  SHORT TERM GOALS: Target date: 08/08/2023   Pt will be ind with initial HEP Baseline: Goal status: INITIAL  2.   Pt will have improved lumbar ROM by at least 10% with no pain in all directions Baseline:  Goal status: INITIAL  3.  Pt will be able to reach behind his head/down his back without L shoulder pain  Baseline:  Goal status: INITIAL  4.  Pt will report decrease in overall pain by at least 25% with activities Baseline:  Goal status: INITIAL   LONG TERM GOALS: Target date: 09/05/2023   Pt will be ind with management and progression of HEP to transition to community wellness/gym activities again Baseline:  Goal status: INITIAL  2.  Pt will demo at least 4/5 L posterior shoulder strength for improved UE movement/endurance for conducting Baseline:  Goal status: INITIAL  3.  Pt will demo at least a 1/2 muscle grade increase with bilat LE strength to improve standing and walking tolerance Baseline:  Goal status: INITIAL  4.  Pt will be  able to lift and carry at least 20# with manageable back pain for home and community tasks Baseline:  Goal status: INITIAL  5.  Pt will have improved Modified Oswestry to </=30% to demo MCID Baseline:  Goal status: INITIAL   PLAN:  PT FREQUENCY: 2x/week  PT DURATION: 8 weeks  PLANNED INTERVENTIONS: 97164- PT Re-evaluation, 97110-Therapeutic exercises, 97530- Therapeutic activity, 97112- Neuromuscular re-education, 97535- Self Care, 16109- Manual therapy, [phone]- Gait training, [phone]- Aquatic Therapy, [phone]- Electrical stimulation (unattended), [id number]- Ultrasound, [id number]- Traction (mechanical), [id number]- Ionotophoresis 4mg /ml Dexamethasone, Patient/Family education, Balance training, Stair training, Taping, Dry Needling, Joint mobilization, Spinal mobilization, Cryotherapy, and Moist heat.  PLAN FOR NEXT SESSION: [place]: Progress HEP as indicated. Manual therapy/dry needling as indicated for L periscapular muscles and glute med, R QL (pt is interested in dry needling). continue posterior shoulder girdle strengthening/stretching. Continue to stretch and  strengthen hips/core. May need further SIJ assessment.   Bethanne Brooks, PTA 08/02/23 9:16 AM   [facility] [address], [address] [place], [place] [address] Phone # [phone] Fax [phone]

## 2023-08-02 ENCOUNTER — Ambulatory Visit: Admitting: Physical Therapy

## 2023-08-02 ENCOUNTER — Encounter: Payer: Self-pay | Admitting: Physical Therapy

## 2023-08-02 DIAGNOSIS — G8929 Other chronic pain: Secondary | ICD-10-CM

## 2023-08-02 DIAGNOSIS — R293 Abnormal posture: Secondary | ICD-10-CM

## 2023-08-02 DIAGNOSIS — M25512 Pain in left shoulder: Secondary | ICD-10-CM | POA: Diagnosis not present

## 2023-08-02 DIAGNOSIS — M25551 Pain in right hip: Secondary | ICD-10-CM

## 2023-08-02 DIAGNOSIS — M5459 Other low back pain: Secondary | ICD-10-CM

## 2023-08-02 DIAGNOSIS — R252 Cramp and spasm: Secondary | ICD-10-CM

## 2023-08-02 DIAGNOSIS — R262 Difficulty in walking, not elsewhere classified: Secondary | ICD-10-CM | POA: Diagnosis not present

## 2023-08-02 DIAGNOSIS — M6281 Muscle weakness (generalized): Secondary | ICD-10-CM | POA: Diagnosis not present

## 2023-08-02 DIAGNOSIS — R2689 Other abnormalities of gait and mobility: Secondary | ICD-10-CM

## 2023-08-02 DIAGNOSIS — M25552 Pain in left hip: Secondary | ICD-10-CM | POA: Diagnosis not present

## 2023-08-03 ENCOUNTER — Encounter

## 2023-08-06 NOTE — Therapy (Signed)
 OUTPATIENT PHYSICAL THERAPY THORACOLUMBAR TREATMENT NOTE   Patient Name: Cameron [name] MRN: [id number] DOB:[birth year], 68 y.o., male Today's Date: 08/07/2023  END OF SESSION:  PT End of Session - 08/07/23 1016     Visit Number 7    Date for PT Re-Evaluation 09/05/23    Authorization Type [employer]    PT Start Time 0932    PT Stop Time 1016    PT Time Calculation (min) 44 min    Activity Tolerance Patient tolerated treatment well    Behavior During Therapy [facility] for tasks assessed/performed                  Past Medical History:  Diagnosis Date   Abnormal MRI 2007   nonspecific white matter abnormalities   Allergy    seasonal   Anxiety    no meds   Arthritis    spine   Cataract    GERD (gastroesophageal reflux disease)    Helicobacter pylori gastritis 07/20/2023        History of kidney stones    x 2 episodes- last 2020   Hx of adenomatous colonic polyps 09/02/2021   2 diminutive adenomas recall 2030   Idiopathic scoliosis    Migraines    OCULAR connected to MS   Multiple sclerosis (HCC)    Neuromuscular disorder (HCC)    MS - mild left side spasicity   Vertigo    1 episode.  approx 12/2021.  Lasted 2-3 days.   Past Surgical History:  Procedure Laterality Date   CATARACT EXTRACTION W/PHACO Right 06/28/2023   Procedure: CATARACT EXTRACTION PHACO AND INTRAOCULAR LENS PLACEMENT (IOC) RIGHT  6.17  00:42.8;  Surgeon: Annell Kidney, MD;  Location: [facility];  Service: Ophthalmology;  Laterality: Right;   COLONOSCOPY  06/27/2007   Normal   CYSTOSCOPY/URETEROSCOPY/HOLMIUM LASER/STENT PLACEMENT Right 07/27/2018   Procedure: CYSTOSCOPY/URETEROSCOPY/HOLMIUM LASER/STENT PLACEMENT;  Surgeon: Geraline Knapp, MD;  Location: [facility];  Service: Urology;  Laterality: Right;   LUMBAR DISC SURGERY     URETEROLITHOTOMY  11/16/2001   US  ECHOCARDIOGRAPHY  11/16/2004   normal, Carotids normal   Patient Active Problem List   Diagnosis Date Noted    Helicobacter pylori gastritis 07/20/2023   Early satiety 06/06/2023   Prostate hypertrophy 07/12/2022   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   Hx of adenomatous colonic polyps 09/02/2021   Right optic neuritis 02/04/2021   Lumbar radiculopathy 06/25/2020   Other fatigue 06/25/2020   Lumbar disc herniation 05/01/2020   High risk medication use 11/04/2019   Vitamin D  deficiency 11/04/2019   Left foot drop 11/04/2019   Vision disturbance 11/04/2019   Abnormal MRI of head 09/24/2019   Ureteral calculus 07/25/2018   Hydronephrosis with urinary obstruction due to ureteral calculus 07/25/2018   Sensorineural hearing loss (SNHL), bilateral 03/06/2017   Low back pain 05/29/2014   Routine general medical examination at a health care facility 02/23/2012   GERD (gastroesophageal reflux disease)    Hyperlipemia 10/03/2007   Seasonal allergic rhinitis due to pollen 07/19/2006   RENAL CALCULUS 07/19/2006   Multiple sclerosis (HCC) 10/16/2005    PCP: Helaine Llanos, MD  REFERRING PROVIDER: Assunta Lax, PA-C  REFERRING DIAG: [phone] (ICD-10-CM) - Spondylosis without myelopathy or radiculopathy, lumbar region  Rationale for Evaluation and Treatment: Rehabilitation  THERAPY DIAG:  Other low back pain  Muscle weakness (generalized)  Cramp and spasm  Abnormal posture  Pain in right hip  ONSET DATE: Chronic; at least 2022  SUBJECTIVE:                                                                                                                                                                                           SUBJECTIVE STATEMENT: My left shoulder is really hurting today. I overdid stretching. Now in L pectoralis, down arm.    PERTINENT HISTORY:  L2-L3 ablation, discectomy, MS  PAIN:  07/18/23 Are you having pain? Left shoulder is 5-6/10 in left shoulder. Back 5-6/10.     PRECAUTIONS: None  RED FLAGS: None   WEIGHT  BEARING RESTRICTIONS: No  FALLS:  Has patient fallen in last 6 months? No  LIVING ENVIRONMENT: Lives with: lives with their family, husband Lives in: House/apartment Stairs: No Has following equipment at home: None  OCCUPATION: Programmer, multimedia  PLOF: Independent  PATIENT GOALS: "I would love to be able to do a moderate workout every day and stretching" (Not sure if the stretching he's doing is helping or hurting). "I want to be more active and feel secure with activity" Wants to learn more about energy conservation for his MS, decrease pain levels in general  NEXT MD VISIT: n/a  OBJECTIVE:  Note: Objective measures were completed at Evaluation unless otherwise noted.  DIAGNOSTIC FINDINGS:  11/29/22 Brain MRI IMPRESSION: This MRI of the brain with and without contrast shows the following: Multiple T2/FLAIR hyperintense foci in the cerebral hemispheres in a pattern consistent with chronic demyelinating plaque associated with multiple sclerosis.  None of the foci enhanced or appear to be acute.  Compared to the MRI from 12/28/2021, there were no new lesions. No acute findings.  Normal enhancement pattern.  PATIENT SURVEYS:  Modified Oswestry Low Back Pain Disability Questionnaire: 20 / 50 = 40.0 %  COGNITION: Overall cognitive status: Within functional limits for tasks assessed     SENSATION: L finger N/T; "drop foot" on L with neuropathy  MUSCLE LENGTH: Did not assess on eval Figure 4 stretch is tighter on R hip in seated vs L hip  POSTURE: rounded shoulders and forward head  PALPATION: TTP L>R with trigger points: UT, mid/low trap, infraspinatus, lats/teres minor and major  TTP L>R glute med and hamstrings with trigger points, R>L QL with trigger points. Did not seem particularly tender with SIJ PAs  LUMBAR ROM:   AROM eval 08/07/23  Flexion 75% 90%  Extension 25% (felt good) full  Right lateral flexion 50%* WNL  Left lateral flexion 30% WNL tight on R side  Right rotation  75% WNL  Left rotation 75% WNL   (Blank rows = not tested, * = pain)  UPPER EXTREMITY MMT: most painful when  asked to place his hands behind his head and reach behind him to check functional external rotation ROM  MMT Right eval Left eval  Shoulder flexion 5 5  Shoulder extension (prone) 5 4  Shoulder abduction 5 5  Shoulder adduction    Shoulder internal rotation 5 5  Shoulder external rotation (sitting) 5 3+  Middle trap (prone) 5 4-  Low trap (prone) 4 3-  Elbow flexion 5 5  Elbow extension 5 5   (Blank rows = not tested)   LOWER EXTREMITY MMT:    MMT Right eval Left eval  Hip flexion 4 3+  Hip extension 4 3+  Hip abduction 4 3+  Hip adduction    Hip internal rotation    Hip external rotation    Knee flexion 4 4-  Knee extension 5 5  Ankle dorsiflexion 5 4  Ankle plantarflexion    Ankle inversion    Ankle eversion     (Blank rows = not tested)  LUMBAR SPECIAL TESTS:  Did not assess  FUNCTIONAL TESTS:  Plank for core strength TBA  GAIT: Distance walked: Into clinic Assistive device utilized: None Level of assistance: Complete Independence  TREATMENT DATE:   08/07/23: L shoulder assessed: negative empty/full cane, negative pain with MMT, some discomfort with IR STGs assessed Manual: Trigger Point Dry Needling  Initial Treatment: Pt instructed on Dry Needling rational, procedures, and possible side effects. Pt instructed to expect mild to moderate muscle soreness later in the day and/or into the next day.  Pt instructed in methods to reduce muscle soreness. Pt instructed to continue prescribed HEP. Because Dry Needling was performed over or adjacent to a lung field, pt was educated on S/S of pneumothorax and to seek immediate medical attention should they occur.  Patient was educated on signs and symptoms of infection and other risk factors and advised to seek medical attention should they occur.  Patient verbalized understanding of these instructions  and education.   Patient Verbal Consent Given: Yes Education Handout Provided: Yes Muscles Treated: L UT, levator, infraspinatus, pectoralis, wrist extensors Electrical Stimulation Performed: No Treatment Response/Outcome: Utilized skilled palpation to identify bony landmarks and trigger points.  Able to illicit twitch response and muscle elongation.  Soft tissue mobilization to muscles needled  following DN to further promote tissue elongation and decreased pain.       08/02/23:Pt arrives for aquatic physical therapy. Treatment took place in 3.5-5.5 feet of water. Water temperature was 91 degrees F. Pt entered the pool via independently with mild use of rails. Pt requires buoyancy of water for support and to offload joints with strengthening exercises.  Pt utilizes viscosity of the wate.r required for strengthening. Seated water bench with 75% submersion Pt performed seated LE AROM exercises 20x in all planes, concurrent discussion/education on water principles and how to use them as well as pt's current status and goals. 75% depth water walking 8x in each direction with running arms. Heel lifts 15x. Hip flex/ext 15x Bil and abduction Bil 15x. VC for control Static marching min asst of pool wall for balance 15x Bil. Standing static with arms forward and back 20x, VC to be still. Seated decompression with large noodle behind.Horseback underwater bicycle 3 min.    08/01/23: Nustep x 6 min level 6 3 way scapular stabilization with blue loop x 10 each UE 4D ball rolls with 2 lb plyo ball (light blue) x 20 each direction on each UE Standing bilateral shoulder ER with red tband x 20 Standing  bilateral shoulder horizontal abduction x 20 with red tband Prone left shoulder extension with 2 lbs 2 x 10 Prone left shoulder row with 2 lb 2 x 10 Prone left shoulder horizontal abduction with 2 lb 2 x 10 Side lying left shoulder ER 2 x 10 with 2 lbs Supine serratus punch with 2 lbs 2 x 10 Supine shoulder  alphabet with 2 lb  07/24/23: Nustep x 6 min level 6 Snow angel on wall x 10 cues to retract toward the wall  Low trap lift off x 10 cues to maintain Y position Doorway stretch 2 x 30 sec at 90 deg and Y positions B Seated chin tucks x 10 Standing low rows 15# x 10 Standing shoulder ext Blue x 10 Supine PPT with 90/90 heel tap 3x5 PPT with alternating arm and leg 2x5 Standing hamstring stretch 3 x 30 sec Standing quad stretch 3 x 30 sec  PATIENT EDUCATION:  Education details:HEP UPDATE Person educated: Patient Education method: Explanation, Demonstration, and Handouts Education comprehension: verbalized understanding, returned demonstration, and needs further education  HOME EXERCISE PROGRAM: Access Code: [email or web address] URL: [email or web address] Date: 07/18/2023 Prepared by: Aletha Anderson  Exercises - Child's Pose with Sidebending  - 1 x daily - 7 x weekly - 2 sets - 30 sec hold - Seated Piriformis Stretch  - 1 x daily - 7 x weekly - 2 sets - 30 sec hold - Seated Figure 4 Piriformis Stretch  - 1 x daily - 7 x weekly - 2 sets - 30 sec hold - Supine Figure 4  - 1 x daily - 7 x weekly - 2 sets - 30 sec hold - Supine Piriformis Stretch with Foot on Ground  - 1 x daily - 7 x weekly - 2 sets - 30 sec hold - Standing Hamstring Stretch on Chair  - 1 x daily - 7 x weekly - 1 sets - 3 reps - 30 sec hold - Quadricep Stretch with Chair and Counter Support  - 1 x daily - 7 x weekly - 1 sets - 3 reps - 30 sec hold - Supine Posterior Pelvic Tilt  - 1 x daily - 7 x weekly - 1 sets - 20 reps - Supine 90/90 Alternating Heel Touches with Posterior Pelvic Tilt  - 1 x daily - 7 x weekly - 1 sets - 20 reps - Dead Bug  - 1 x daily - 7 x weekly - 1 sets - 20 reps  ASSESSMENT:  CLINICAL IMPRESSION: Patient presents today with marked increased pain in his L shoulder after over-stretching this weekend. He Has increased tension and pain with palpation in his UT, levator, pecs and forearm.  Excellent response to DN/MT in the L UQ. Patient reports decreased pain to 2-3/10 in the L UQ  and improved mobility at end of session. His back pain which he had at start of treament at 5-6/10 was also gone. He has met 3 of 4 LTGs despite his flare up. He continues to demonstrate potential for improvement and would benefit from continued skilled therapy to address impairments.    OBJECTIVE IMPAIRMENTS: decreased activity tolerance, decreased coordination, decreased endurance, decreased mobility, difficulty walking, decreased ROM, decreased strength, increased fascial restrictions, increased muscle spasms, impaired flexibility, impaired sensation, impaired tone, impaired UE functional use, improper body mechanics, postural dysfunction, and pain.   ACTIVITY LIMITATIONS: lifting, bending, standing, squatting, sleeping, reach over head, and locomotion level  PARTICIPATION LIMITATIONS: meal prep, cleaning, laundry, shopping, community activity, occupation, and yard work  PERSONAL FACTORS: Age, Fitness, Past/current experiences, Profession, Time since onset of injury/illness/exacerbation, and 1 comorbidity: MS  are also affecting patient's functional outcome.   REHAB POTENTIAL: Good  CLINICAL DECISION MAKING: Evolving/moderate complexity  EVALUATION COMPLEXITY: Moderate   GOALS: Goals reviewed with patient? Yes  SHORT TERM GOALS: Target date: 08/08/2023   Pt will be ind with initial HEP Baseline: Goal status: MET  08/07/23  2.  Pt will have improved lumbar ROM by at least 10% with no pain in all directions Baseline:  Goal status: MET 08/07/23 see objective  3.  Pt will be able to reach behind his head/down his back without L shoulder pain  Baseline:  Goal status: INITIAL  4.  Pt will report decrease in overall pain by at least 25% with activities Baseline:  Goal status: MET 08/07/23 back   LONG TERM GOALS: Target date: 09/05/2023   Pt will be ind with management and progression of  HEP to transition to community wellness/gym activities again Baseline:  Goal status: INITIAL  2.  Pt will demo at least 4/5 L posterior shoulder strength for improved UE movement/endurance for conducting Baseline:  Goal status: INITIAL  3.  Pt will demo at least a 1/2 muscle grade increase with bilat LE strength to improve standing and walking tolerance Baseline:  Goal status: INITIAL  4.  Pt will be able to lift and carry at least 20# with manageable back pain for home and community tasks Baseline:  Goal status: INITIAL  5.  Pt will have improved Modified Oswestry to </=30% to demo MCID Baseline:  Goal status: INITIAL   PLAN:  PT FREQUENCY: 2x/week  PT DURATION: 8 weeks  PLANNED INTERVENTIONS: 97164- PT Re-evaluation, 97110-Therapeutic exercises, 97530- Therapeutic activity, 97112- Neuromuscular re-education, 97535- Self Care, 40981- Manual therapy, [phone]- Gait training, [phone]- Aquatic Therapy, [phone]- Electrical stimulation (unattended), [id number]- Ultrasound, [id number]- Traction (mechanical), [id number]- Ionotophoresis 4mg /ml Dexamethasone , Patient/Family education, Balance training, Stair training, Taping, Dry Needling, Joint mobilization, Spinal mobilization, Cryotherapy, and Moist heat.  PLAN FOR NEXT SESSION: Land: Progress HEP as indicated. Assess response to Manual therapy/dry needling and continue as indicated for L periscapular muscles and glute med, R QL.. continue posterior shoulder girdle strengthening/stretching. Continue to stretch and strengthen hips/core. May need further SIJ assessment.   Jinx Mourning, PT 08/07/23 1:33 PM   [facility] [address] [place], [place] [address] Phone # [phone] Fax [phone]

## 2023-08-07 ENCOUNTER — Ambulatory Visit: Admitting: Physical Therapy

## 2023-08-07 ENCOUNTER — Ambulatory Visit: Payer: Medicare PPO | Admitting: Neurology

## 2023-08-07 ENCOUNTER — Encounter: Payer: Self-pay | Admitting: Physical Therapy

## 2023-08-07 DIAGNOSIS — M5459 Other low back pain: Secondary | ICD-10-CM | POA: Diagnosis not present

## 2023-08-07 DIAGNOSIS — R293 Abnormal posture: Secondary | ICD-10-CM | POA: Diagnosis not present

## 2023-08-07 DIAGNOSIS — R252 Cramp and spasm: Secondary | ICD-10-CM | POA: Diagnosis not present

## 2023-08-07 DIAGNOSIS — M25551 Pain in right hip: Secondary | ICD-10-CM | POA: Diagnosis not present

## 2023-08-07 DIAGNOSIS — G8929 Other chronic pain: Secondary | ICD-10-CM | POA: Diagnosis not present

## 2023-08-07 DIAGNOSIS — R262 Difficulty in walking, not elsewhere classified: Secondary | ICD-10-CM | POA: Diagnosis not present

## 2023-08-07 DIAGNOSIS — M6281 Muscle weakness (generalized): Secondary | ICD-10-CM

## 2023-08-07 DIAGNOSIS — M25512 Pain in left shoulder: Secondary | ICD-10-CM | POA: Diagnosis not present

## 2023-08-07 DIAGNOSIS — M25552 Pain in left hip: Secondary | ICD-10-CM | POA: Diagnosis not present

## 2023-08-07 NOTE — Patient Instructions (Signed)

## 2023-08-09 ENCOUNTER — Ambulatory Visit: Admitting: Physical Therapy

## 2023-08-09 ENCOUNTER — Encounter: Payer: Self-pay | Admitting: Physical Therapy

## 2023-08-09 ENCOUNTER — Encounter: Admitting: Physical Therapy

## 2023-08-09 DIAGNOSIS — M5459 Other low back pain: Secondary | ICD-10-CM | POA: Diagnosis not present

## 2023-08-09 DIAGNOSIS — R262 Difficulty in walking, not elsewhere classified: Secondary | ICD-10-CM | POA: Diagnosis not present

## 2023-08-09 DIAGNOSIS — R252 Cramp and spasm: Secondary | ICD-10-CM

## 2023-08-09 DIAGNOSIS — M25551 Pain in right hip: Secondary | ICD-10-CM

## 2023-08-09 DIAGNOSIS — R2689 Other abnormalities of gait and mobility: Secondary | ICD-10-CM

## 2023-08-09 DIAGNOSIS — R293 Abnormal posture: Secondary | ICD-10-CM

## 2023-08-09 DIAGNOSIS — G8929 Other chronic pain: Secondary | ICD-10-CM | POA: Diagnosis not present

## 2023-08-09 DIAGNOSIS — M25552 Pain in left hip: Secondary | ICD-10-CM | POA: Diagnosis not present

## 2023-08-09 DIAGNOSIS — M6281 Muscle weakness (generalized): Secondary | ICD-10-CM | POA: Diagnosis not present

## 2023-08-09 DIAGNOSIS — M25512 Pain in left shoulder: Secondary | ICD-10-CM | POA: Diagnosis not present

## 2023-08-09 NOTE — Therapy (Signed)
 OUTPATIENT PHYSICAL THERAPY THORACOLUMBAR TREATMENT NOTE   Patient Name: Cameron [name] MRN: [id number] DOB:Mar 24, 1956, 68 y.o., male Today's Date: 08/09/2023  END OF SESSION:  PT End of Session - 08/09/23 0837     Visit Number 8    Number of Visits 16    Date for PT Re-Evaluation 09/05/23    Authorization Type [employer]    PT Start Time [id number]    PT Stop Time 0932    PT Time Calculation (min) 55 min    Activity Tolerance Patient tolerated treatment well    Behavior During Therapy [facility] for tasks assessed/performed                   Past Medical History:  Diagnosis Date   Abnormal MRI 2007   nonspecific white matter abnormalities   Allergy    seasonal   Anxiety    no meds   Arthritis    spine   Cataract    GERD (gastroesophageal reflux disease)    Helicobacter pylori gastritis 07/20/2023        History of kidney stones    x 2 episodes- last 2020   Hx of adenomatous colonic polyps 09/02/2021   2 diminutive adenomas recall 2030   Idiopathic scoliosis    Migraines    OCULAR connected to MS   Multiple sclerosis (HCC)    Neuromuscular disorder (HCC)    MS - mild left side spasicity   Vertigo    1 episode.  approx 12/2021.  Lasted 2-3 days.   Past Surgical History:  Procedure Laterality Date   CATARACT EXTRACTION W/PHACO Right 06/28/2023   Procedure: CATARACT EXTRACTION PHACO AND INTRAOCULAR LENS PLACEMENT (IOC) RIGHT  6.17  00:42.8;  Surgeon: Annell Kidney, MD;  Location: [facility];  Service: Ophthalmology;  Laterality: Right;   COLONOSCOPY  06/27/2007   Normal   CYSTOSCOPY/URETEROSCOPY/HOLMIUM LASER/STENT PLACEMENT Right 07/27/2018   Procedure: CYSTOSCOPY/URETEROSCOPY/HOLMIUM LASER/STENT PLACEMENT;  Surgeon: Geraline Knapp, MD;  Location: [facility];  Service: Urology;  Laterality: Right;   LUMBAR DISC SURGERY     URETEROLITHOTOMY  11/16/2001   US  ECHOCARDIOGRAPHY  11/16/2004   normal, Carotids normal   Patient Active Problem List    Diagnosis Date Noted   Helicobacter pylori gastritis 07/20/2023   Early satiety 06/06/2023   Prostate hypertrophy 07/12/2022   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   Hx of adenomatous colonic polyps 09/02/2021   Right optic neuritis 02/04/2021   Lumbar radiculopathy 06/25/2020   Other fatigue 06/25/2020   Lumbar disc herniation 05/01/2020   High risk medication use 11/04/2019   Vitamin D  deficiency 11/04/2019   Left foot drop 11/04/2019   Vision disturbance 11/04/2019   Abnormal MRI of head 09/24/2019   Ureteral calculus 07/25/2018   Hydronephrosis with urinary obstruction due to ureteral calculus 07/25/2018   Sensorineural hearing loss (SNHL), bilateral 03/06/2017   Low back pain 05/29/2014   Routine general medical examination at a health care facility 02/23/2012   GERD (gastroesophageal reflux disease)    Hyperlipemia 10/03/2007   Seasonal allergic rhinitis due to pollen 07/19/2006   RENAL CALCULUS 07/19/2006   Multiple sclerosis (HCC) 10/16/2005    PCP: Helaine Llanos, MD  REFERRING PROVIDER: Assunta Lax, PA-C  REFERRING DIAG: [phone] (ICD-10-CM) - Spondylosis without myelopathy or radiculopathy, lumbar region  Rationale for Evaluation and Treatment: Rehabilitation  THERAPY DIAG:  Other low back pain  Muscle weakness (generalized)  Cramp and spasm  Abnormal posture  Pain in right  hip  Difficulty in walking, not elsewhere classified  Pain in left hip  Other abnormalities of gait and mobility  Chronic left shoulder pain  ONSET DATE: Chronic; at least 2022  SUBJECTIVE:                                                                                                                                                                                           SUBJECTIVE STATEMENT: Dry needling was great, a little sore initially but much better a day later. I think I would like to try it again. I did not have the same  level of fatigue in the pool last time like I did on the first day.  PERTINENT HISTORY:  L2-L3 ablation, discectomy, MS  PAIN:  07/18/23 Are you having pain? Left shoulder is 3-4/10 in left shoulder.     PRECAUTIONS: None  RED FLAGS: None   WEIGHT BEARING RESTRICTIONS: No  FALLS:  Has patient fallen in last 6 months? No  LIVING ENVIRONMENT: Lives with: lives with their family, husband Lives in: House/apartment Stairs: No Has following equipment at home: None  OCCUPATION: Programmer, multimedia  PLOF: Independent  PATIENT GOALS: "I would love to be able to do a moderate workout every day and stretching" (Not sure if the stretching he's doing is helping or hurting). "I want to be more active and feel secure with activity" Wants to learn more about energy conservation for his MS, decrease pain levels in general  NEXT MD VISIT: n/a  OBJECTIVE:  Note: Objective measures were completed at Evaluation unless otherwise noted.  DIAGNOSTIC FINDINGS:  11/29/22 Brain MRI IMPRESSION: This MRI of the brain with and without contrast shows the following: Multiple T2/FLAIR hyperintense foci in the cerebral hemispheres in a pattern consistent with chronic demyelinating plaque associated with multiple sclerosis.  None of the foci enhanced or appear to be acute.  Compared to the MRI from 12/28/2021, there were no new lesions. No acute findings.  Normal enhancement pattern.  PATIENT SURVEYS:  Modified Oswestry Low Back Pain Disability Questionnaire: 20 / 50 = 40.0 %  COGNITION: Overall cognitive status: Within functional limits for tasks assessed     SENSATION: L finger N/T; "drop foot" on L with neuropathy  MUSCLE LENGTH: Did not assess on eval Figure 4 stretch is tighter on R hip in seated vs L hip  POSTURE: rounded shoulders and forward head  PALPATION: TTP L>R with trigger points: UT, mid/low trap, infraspinatus, lats/teres minor and major  TTP L>R glute med and hamstrings with trigger  points, R>L QL with trigger points. Did not seem particularly tender with SIJ PAs  LUMBAR ROM:  AROM eval 08/07/23  Flexion 75% 90%  Extension 25% (felt good) full  Right lateral flexion 50%* WNL  Left lateral flexion 30% WNL tight on R side  Right rotation 75% WNL  Left rotation 75% WNL   (Blank rows = not tested, * = pain)  UPPER EXTREMITY MMT: most painful when asked to place his hands behind his head and reach behind him to check functional external rotation ROM  MMT Right eval Left eval  Shoulder flexion 5 5  Shoulder extension (prone) 5 4  Shoulder abduction 5 5  Shoulder adduction    Shoulder internal rotation 5 5  Shoulder external rotation (sitting) 5 3+  Middle trap (prone) 5 4-  Low trap (prone) 4 3-  Elbow flexion 5 5  Elbow extension 5 5   (Blank rows = not tested)   LOWER EXTREMITY MMT:    MMT Right eval Left eval  Hip flexion 4 3+  Hip extension 4 3+  Hip abduction 4 3+  Hip adduction    Hip internal rotation    Hip external rotation    Knee flexion 4 4-  Knee extension 5 5  Ankle dorsiflexion 5 4  Ankle plantarflexion    Ankle inversion    Ankle eversion     (Blank rows = not tested)  LUMBAR SPECIAL TESTS:  Did not assess  FUNCTIONAL TESTS:  Plank for core strength TBA  GAIT: Distance walked: Into clinic Assistive device utilized: None Level of assistance: Complete Independence  TREATMENT DATE:   08/09/23:Pt arrives for aquatic physical therapy. Treatment took place in 3.5-5.5 feet of water. Water temperature was 91 degrees F. Pt entered the pool via independently with mild use of rails. Pt requires buoyancy of water for support and to offload joints with strengthening exercises.  Pt utilizes viscosity of the water required for strengthening. Seated water bench with 75% submersion Pt performed seated LE AROM exercises 20x in all planes, concurrent discussion on pt's current status and goals. 75% depth water walking 10x in each direction  with Ue push/pull with hand floats. Heel lifts 15x. Hip 3 way kicks with more focus o the single leg balance today 20x each Bil. High knee march across pool 4 lengths holding hand floats to assist balance..Standing against wall with arms forward and back 10x for core and lat press, then repeated off the wall 10x. Pt held hand floats for resistance. VC to be still. Seated decompression with large noodle behind.Horseback underwater bicycle 5 min.   08/07/23: L shoulder assessed: negative empty/full cane, negative pain with MMT, some discomfort with IR STGs assessed Manual: Trigger Point Dry Needling  Initial Treatment: Pt instructed on Dry Needling rational, procedures, and possible side effects. Pt instructed to expect mild to moderate muscle soreness later in the day and/or into the next day.  Pt instructed in methods to reduce muscle soreness. Pt instructed to continue prescribed HEP. Because Dry Needling was performed over or adjacent to a lung field, pt was educated on S/S of pneumothorax and to seek immediate medical attention should they occur.  Patient was educated on signs and symptoms of infection and other risk factors and advised to seek medical attention should they occur.  Patient verbalized understanding of these instructions and education.   Patient Verbal Consent Given: Yes Education Handout Provided: Yes Muscles Treated: L UT, levator, infraspinatus, pectoralis, wrist extensors Electrical Stimulation Performed: No Treatment Response/Outcome: Utilized skilled palpation to identify bony landmarks and trigger points.  Able to illicit twitch response  and muscle elongation.  Soft tissue mobilization to muscles needled  following DN to further promote tissue elongation and decreased pain.       08/02/23:Pt arrives for aquatic physical therapy. Treatment took place in 3.5-5.5 feet of water. Water temperature was 91 degrees F. Pt entered the pool via independently with mild use of  rails. Pt requires buoyancy of water for support and to offload joints with strengthening exercises.  Pt utilizes viscosity of the wate.r required for strengthening. Seated water bench with 75% submersion Pt performed seated LE AROM exercises 20x in all planes, concurrent discussion/education on water principles and how to use them as well as pt's current status and goals. 75% depth water walking 8x in each direction with running arms. Heel lifts 15x. Hip flex/ext 15x Bil and abduction Bil 15x. VC for control Static marching min asst of pool wall for balance 15x Bil. Standing static with arms forward and back 20x, VC to be still. Seated decompression with large noodle behind.Horseback underwater bicycle 3 min.    PATIENT EDUCATION:  Education details:HEP UPDATE Person educated: Patient Education method: Explanation, Demonstration, and Handouts Education comprehension: verbalized understanding, returned demonstration, and needs further education  HOME EXERCISE PROGRAM: Access Code: [email or web address] URL: [email or web address] Date: 07/18/2023 Prepared by: Aletha Anderson  Exercises - Child's Pose with Sidebending  - 1 x daily - 7 x weekly - 2 sets - 30 sec hold - Seated Piriformis Stretch  - 1 x daily - 7 x weekly - 2 sets - 30 sec hold - Seated Figure 4 Piriformis Stretch  - 1 x daily - 7 x weekly - 2 sets - 30 sec hold - Supine Figure 4  - 1 x daily - 7 x weekly - 2 sets - 30 sec hold - Supine Piriformis Stretch with Foot on Ground  - 1 x daily - 7 x weekly - 2 sets - 30 sec hold - Standing Hamstring Stretch on Chair  - 1 x daily - 7 x weekly - 1 sets - 3 reps - 30 sec hold - Quadricep Stretch with Chair and Counter Support  - 1 x daily - 7 x weekly - 1 sets - 3 reps - 30 sec hold - Supine Posterior Pelvic Tilt  - 1 x daily - 7 x weekly - 1 sets - 20 reps - Supine 90/90 Alternating Heel Touches with Posterior Pelvic Tilt  - 1 x daily - 7 x weekly - 1 sets - 20 reps - Dead Bug  - 1 x  daily - 7 x weekly - 1 sets - 20 reps  ASSESSMENT:  CLINICAL IMPRESSION: Dry needling effective in reducing neck and shoulder pain last treatment. Arrives to aquatic PT with little pain. Improving in overall steadiness in the pool specifically single limb stance.   OBJECTIVE IMPAIRMENTS: decreased activity tolerance, decreased coordination, decreased endurance, decreased mobility, difficulty walking, decreased ROM, decreased strength, increased fascial restrictions, increased muscle spasms, impaired flexibility, impaired sensation, impaired tone, impaired UE functional use, improper body mechanics, postural dysfunction, and pain.   ACTIVITY LIMITATIONS: lifting, bending, standing, squatting, sleeping, reach over head, and locomotion level  PARTICIPATION LIMITATIONS: meal prep, cleaning, laundry, shopping, community activity, occupation, and yard work  PERSONAL FACTORS: Age, Fitness, Past/current experiences, Profession, Time since onset of injury/illness/exacerbation, and 1 comorbidity: MS  are also affecting patient's functional outcome.   REHAB POTENTIAL: Good  CLINICAL DECISION MAKING: Evolving/moderate complexity  EVALUATION COMPLEXITY: Moderate   GOALS: Goals reviewed with patient? Yes  SHORT TERM GOALS: Target  date: 08/08/2023   Pt will be ind with initial HEP Baseline: Goal status: MET  08/07/23  2.  Pt will have improved lumbar ROM by at least 10% with no pain in all directions Baseline:  Goal status: MET 08/07/23 see objective  3.  Pt will be able to reach behind his head/down his back without L shoulder pain  Baseline:  Goal status: INITIAL  4.  Pt will report decrease in overall pain by at least 25% with activities Baseline:  Goal status: MET 08/07/23 back   LONG TERM GOALS: Target date: 09/05/2023   Pt will be ind with management and progression of HEP to transition to community wellness/gym activities again Baseline:  Goal status: INITIAL  2.  Pt will demo  at least 4/5 L posterior shoulder strength for improved UE movement/endurance for conducting Baseline:  Goal status: INITIAL  3.  Pt will demo at least a 1/2 muscle grade increase with bilat LE strength to improve standing and walking tolerance Baseline:  Goal status: INITIAL  4.  Pt will be able to lift and carry at least 20# with manageable back pain for home and community tasks Baseline:  Goal status: INITIAL  5.  Pt will have improved Modified Oswestry to </=30% to demo MCID Baseline:  Goal status: INITIAL   PLAN:  PT FREQUENCY: 2x/week  PT DURATION: 8 weeks  PLANNED INTERVENTIONS: 97164- PT Re-evaluation, 97110-Therapeutic exercises, 97530- Therapeutic activity, 97112- Neuromuscular re-education, 97535- Self Care, 21308- Manual therapy, [id number]- Gait training, [phone]- Aquatic Therapy, [phone]- Electrical stimulation (unattended), [id number]- Ultrasound, [id number]- Traction (mechanical), [id number]- Ionotophoresis 4mg /ml Dexamethasone , Patient/Family education, Balance training, Stair training, Taping, Dry Needling, Joint mobilization, Spinal mobilization, Cryotherapy, and Moist heat.  PLAN FOR NEXT SESSION: [place]: Progress HEP as indicated. Assess response to Manual therapy/dry needling and continue as indicated for L periscapular muscles and glute med, R QL.. continue posterior shoulder girdle strengthening/stretching. Continue to stretch and strengthen hips/core. May need further SIJ assessment.  Bethanne Brooks, PTA 08/09/23 9:30 AM    [facility] [address] [place], [place] [address] Phone # [phone] Fax [phone]

## 2023-08-10 ENCOUNTER — Ambulatory Visit: Admitting: Internal Medicine

## 2023-08-10 ENCOUNTER — Encounter: Payer: Self-pay | Admitting: Internal Medicine

## 2023-08-10 VITALS — BP 120/84 | HR 81 | Temp 97.8°F | Ht 70.5 in | Wt 178.0 lb

## 2023-08-10 DIAGNOSIS — S86899A Other injury of other muscle(s) and tendon(s) at lower leg level, unspecified leg, initial encounter: Secondary | ICD-10-CM | POA: Insufficient documentation

## 2023-08-10 DIAGNOSIS — M778 Other enthesopathies, not elsewhere classified: Secondary | ICD-10-CM | POA: Diagnosis not present

## 2023-08-10 NOTE — Assessment & Plan Note (Signed)
 Strained with exercise Discussed that it should be self limited Ice--try diclofenac topical

## 2023-08-10 NOTE — Patient Instructions (Signed)
 Please try over the counter diclofenac gel for the pain in your shoulder  Shin Splints  Shin splints is a painful condition that is felt either on the bone that is located in the front of the lower leg (tibia or shin bone) or in the muscles on either side of the bone. This condition happens when physical activity leads to inflammation of the muscles, tendons, and the thin layer of tissue that covers the shin bone. It may result from participating in sports or other intense exercises. What are the causes? This condition may be caused by: Muscle overuse. Repetitive activities. Flat feet or rigid arches. Activities that could contribute to shin splints include: Having a sudden increase in exercise time. Starting a new, intense activity. Running up hills or long distances. Playing sports that involve sudden starts and stops. Not warming up before activity. Wearing old or worn-out shoes. What are the signs or symptoms? The main symptom of this condition is pain that occurs: On the front of the lower leg. In the muscles on either side of the shin bone. While exercising or at rest. How is this diagnosed? This condition may be diagnosed based on: A physical exam. Your symptoms. Observation. Your health care provider may watch you while you walk or run. X-rays or other imaging tests. These may be done to rule out other problems. How is this treated? Treatment for this condition depends on your age, history, overall health, and how bad the pain is. Most cases can be managed by doing one or more of the following: Resting. Reducing the length and intensity of your exercise. Stopping or reducing the activity that causes shin pain. Taking medicines to control the inflammation. Icing, massaging, stretching, and strengthening the affected area. Wearing shoes that have rigid heels, shock absorption, and a good arch support. For severe shin pain, your health care provider may recommend that you use  crutches to avoid putting weight on your legs. Use crutches as you are told. Follow these instructions at home: Medicines Take over-the-counter and prescription medicines only as told by your health care provider. Ask your health care provider if the medicine prescribed to you: Requires you to avoid driving or using machinery. Can cause constipation. You may need to take these actions to prevent or treat constipation: Drink enough fluid to keep your urine pale yellow. Take over-the-counter or prescription medicines. Eat foods that are high in fiber, such as beans, whole grains, and fresh fruits and vegetables. Limit foods that are high in fat and processed sugars, such as fried or sweet foods. Managing pain, stiffness, and swelling     If directed, put ice on the painful area. To do this: Put ice in a plastic bag. Place a towel between your skin and the bag. Leave the ice on for 20 minutes, 2-3 times a day. If your skin turns bright red, remove the ice right away to prevent skin damage. The risk of skin damage is higher if you cannot feel pain, heat, or cold. If directed, apply heat to the painful area before you do the stretching exercises, or as told by your health care provider. Use the heat source that your health care provider recommends, such as a moist heat pack or a heating pad. Place a towel between your skin and the heat source. Leave the heat on for 20-30 minutes. If your skin turns bright red, remove the heat right away to prevent burns. The risk of burns is higher if you cannot feel pain,  heat, or cold. Massage, stretch, and strengthen the affected area as told by your health care provider. Wear compression sleeves or socks as told by your health care provider. Raise (elevate) your legs above the level of your heart while you are sitting or lying down. Activity Rest as needed. Return to activity gradually as told by your health care provider. When you start exercising again,  begin with non-weight-bearing exercises, such as cycling or swimming. Stop running if the pain returns. Warm up properly before exercising. Run on a surface that is level and fairly firm. Gradually change the intensity of an exercise. If you increase your running distance, add only 5-10% to your distance each week. This means that if you are running 5 miles this week, you should only increase your run by - mile for next week. General instructions Wear shoes that have rigid heels, shock absorption, and a good arch support. Change your athletic shoes every 6 months, or every 350-450 miles. Contact a health care provider if: Your symptoms continue or get worse after treatment. The location, intensity, or type of pain changes. You have swelling in your lower leg that gets worse. Your shin becomes red and feels warm. Get help right away if: You have severe pain. You have trouble walking. Summary Shin splints happens when physical activities lead to inflammation of the muscles, tendons, and the thin layer of tissue that covers the shin bone. Treatments may include medicines, resting, and icing. Return to activity gradually as told by your health care provider. Make sure you know what symptoms should cause you to contact your health care provider. This information is not intended to replace advice given to you by your health care provider. Make sure you discuss any questions you have with your health care provider. Document Revised: 07/02/2021 Document Reviewed: 07/02/2021 Elsevier Patient Education  2024 [employer].

## 2023-08-10 NOTE — Progress Notes (Signed)
   Subjective:    Patient ID: Cameron [name], male    DOB: [birth year], 68 y.o.   MRN: [id number]  HPI Here due to left shoulder and leg pain  Has left shoulder injury---on trip 2 weeks ago in [place] , and had just started PT for low back, hips and spine He may have overdone it trying a pectoral stretch (posterior pressure against shoulder) Tightness in shoulder and radiating down left arm and around towards clavicle Got dry needling at last PT visit which did help Then started flaring again No trouble with ROM--more trouble when just sitting/driving  Usually taking single ibuprofen  dose daily --600mg . Does help Restarted cyclobenzaprine  (2.5mg ) at bedtime in past 2-3 nights (helps him sleep well)  Yesterday--woke with intermittent sharp pains along anterior tibia area Has repeatedly done exercises where he sits on his legs and  Review of Systems     Objective:   Physical Exam Constitutional:      Appearance: Normal appearance.  Musculoskeletal:     Comments: Left shoulder---normal active abduction. Fairly normal ROM --just slightly decreased ext rotation Tenderness along posterior shoulder (triceps tendon) No bursa tenderness  Left knee normal No calf tenderness  Neurological:     Mental Status: He is alert.            Assessment & Plan:

## 2023-08-10 NOTE — Assessment & Plan Note (Signed)
Discussed Rx 

## 2023-08-14 ENCOUNTER — Ambulatory Visit: Admitting: Physical Therapy

## 2023-08-14 ENCOUNTER — Encounter: Payer: Self-pay | Admitting: Physical Therapy

## 2023-08-14 DIAGNOSIS — M25552 Pain in left hip: Secondary | ICD-10-CM | POA: Diagnosis not present

## 2023-08-14 DIAGNOSIS — M5459 Other low back pain: Secondary | ICD-10-CM

## 2023-08-14 DIAGNOSIS — M6281 Muscle weakness (generalized): Secondary | ICD-10-CM

## 2023-08-14 DIAGNOSIS — R262 Difficulty in walking, not elsewhere classified: Secondary | ICD-10-CM | POA: Diagnosis not present

## 2023-08-14 DIAGNOSIS — R293 Abnormal posture: Secondary | ICD-10-CM | POA: Diagnosis not present

## 2023-08-14 DIAGNOSIS — G8929 Other chronic pain: Secondary | ICD-10-CM

## 2023-08-14 DIAGNOSIS — M25551 Pain in right hip: Secondary | ICD-10-CM | POA: Diagnosis not present

## 2023-08-14 DIAGNOSIS — R252 Cramp and spasm: Secondary | ICD-10-CM

## 2023-08-14 DIAGNOSIS — M25512 Pain in left shoulder: Secondary | ICD-10-CM | POA: Diagnosis not present

## 2023-08-14 NOTE — Therapy (Signed)
 OUTPATIENT PHYSICAL THERAPY THORACOLUMBAR TREATMENT NOTE   Patient Name: Cameron [name] MRN: [id number] DOB:October 26, 1955, 68 y.o., male Today's Date: 08/14/2023  END OF SESSION:  PT End of Session - 08/14/23 0932     Visit Number 9    Number of Visits 16    Date for PT Re-Evaluation 09/05/23    Authorization Type [employer]    PT Start Time 0932    PT Stop Time 1017    PT Time Calculation (min) 45 min    Activity Tolerance Patient tolerated treatment well    Behavior During Therapy [facility] for tasks assessed/performed                   Past Medical History:  Diagnosis Date   Abnormal MRI 2007   nonspecific white matter abnormalities   Allergy    seasonal   Anxiety    no meds   Arthritis    spine   Cataract    GERD (gastroesophageal reflux disease)    Helicobacter pylori gastritis 07/20/2023        History of kidney stones    x 2 episodes- last 2020   Hx of adenomatous colonic polyps 09/02/2021   2 diminutive adenomas recall 2030   Idiopathic scoliosis    Migraines    OCULAR connected to MS   Multiple sclerosis (HCC)    Neuromuscular disorder (HCC)    MS - mild left side spasicity   Vertigo    1 episode.  approx 12/2021.  Lasted 2-3 days.   Past Surgical History:  Procedure Laterality Date   CATARACT EXTRACTION W/PHACO Right 06/28/2023   Procedure: CATARACT EXTRACTION PHACO AND INTRAOCULAR LENS PLACEMENT (IOC) RIGHT  6.17  00:42.8;  Surgeon: Annell Kidney, MD;  Location: [facility];  Service: Ophthalmology;  Laterality: Right;   COLONOSCOPY  06/27/2007   Normal   CYSTOSCOPY/URETEROSCOPY/HOLMIUM LASER/STENT PLACEMENT Right 07/27/2018   Procedure: CYSTOSCOPY/URETEROSCOPY/HOLMIUM LASER/STENT PLACEMENT;  Surgeon: Geraline Knapp, MD;  Location: [facility];  Service: Urology;  Laterality: Right;   LUMBAR DISC SURGERY     URETEROLITHOTOMY  11/16/2001   US  ECHOCARDIOGRAPHY  11/16/2004   normal, Carotids normal   Patient Active Problem List    Diagnosis Date Noted   Tendonitis of shoulder 08/10/2023   Anterior shin splints 08/10/2023   Helicobacter pylori gastritis 07/20/2023   Early satiety 06/06/2023   Prostate hypertrophy 07/12/2022   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   Hx of adenomatous colonic polyps 09/02/2021   Right optic neuritis 02/04/2021   Lumbar radiculopathy 06/25/2020   Other fatigue 06/25/2020   Lumbar disc herniation 05/01/2020   High risk medication use 11/04/2019   Vitamin D  deficiency 11/04/2019   Left foot drop 11/04/2019   Vision disturbance 11/04/2019   Abnormal MRI of head 09/24/2019   Ureteral calculus 07/25/2018   Hydronephrosis with urinary obstruction due to ureteral calculus 07/25/2018   Sensorineural hearing loss (SNHL), bilateral 03/06/2017   Low back pain 05/29/2014   Routine general medical examination at a health care facility 02/23/2012   GERD (gastroesophageal reflux disease)    Hyperlipemia 10/03/2007   Seasonal allergic rhinitis due to pollen 07/19/2006   RENAL CALCULUS 07/19/2006   Multiple sclerosis (HCC) 10/16/2005    PCP: Helaine Llanos, MD  REFERRING PROVIDER: Assunta Lax, PA-C  REFERRING DIAG: [phone] (ICD-10-CM) - Spondylosis without myelopathy or radiculopathy, lumbar region  Rationale for Evaluation and Treatment: Rehabilitation  THERAPY DIAG:  Other low back pain  Muscle weakness (  generalized)  Cramp and spasm  Abnormal posture  Chronic left shoulder pain  Pain in right hip  Pain in left hip  ONSET DATE: Chronic; at least 2022  SUBJECTIVE:                                                                                                                                                                                           SUBJECTIVE STATEMENT: The shoulder is better, but still hurting in the arm pit and deltoid region and the forearm is really problematic. I can take less medicine now and the recovery is  quicker.  I have tightness in the hips, but no pain in the morning.   PERTINENT HISTORY:  L2-L3 ablation, discectomy, MS  PAIN:  07/18/23 Are you having pain? Left shoulder is 3-4/10 in left shoulder.     PRECAUTIONS: None  RED FLAGS: None   WEIGHT BEARING RESTRICTIONS: No  FALLS:  Has patient fallen in last 6 months? No  LIVING ENVIRONMENT: Lives with: lives with their family, husband Lives in: House/apartment Stairs: No Has following equipment at home: None  OCCUPATION: Programmer, multimedia  PLOF: Independent  PATIENT GOALS: "I would love to be able to do a moderate workout every day and stretching" (Not sure if the stretching he's doing is helping or hurting). "I want to be more active and feel secure with activity" Wants to learn more about energy conservation for his MS, decrease pain levels in general  NEXT MD VISIT: n/a  OBJECTIVE:  Note: Objective measures were completed at Evaluation unless otherwise noted.  DIAGNOSTIC FINDINGS:  11/29/22 Brain MRI IMPRESSION: This MRI of the brain with and without contrast shows the following: Multiple T2/FLAIR hyperintense foci in the cerebral hemispheres in a pattern consistent with chronic demyelinating plaque associated with multiple sclerosis.  None of the foci enhanced or appear to be acute.  Compared to the MRI from 12/28/2021, there were no new lesions. No acute findings.  Normal enhancement pattern.  PATIENT SURVEYS:  Modified Oswestry Low Back Pain Disability Questionnaire: 20 / 50 = 40.0 %  COGNITION: Overall cognitive status: Within functional limits for tasks assessed     SENSATION: L finger N/T; "drop foot" on L with neuropathy  MUSCLE LENGTH: Did not assess on eval Figure 4 stretch is tighter on R hip in seated vs L hip  POSTURE: rounded shoulders and forward head  PALPATION: TTP L>R with trigger points: UT, mid/low trap, infraspinatus, lats/teres minor and major  TTP L>R glute med and hamstrings with trigger  points, R>L QL with trigger points. Did not seem particularly tender with SIJ PAs  LUMBAR ROM:  AROM eval 08/07/23  Flexion 75% 90%  Extension 25% (felt good) full  Right lateral flexion 50%* WNL  Left lateral flexion 30% WNL tight on R side  Right rotation 75% WNL  Left rotation 75% WNL   (Blank rows = not tested, * = pain)  UPPER EXTREMITY MMT: most painful when asked to place his hands behind his head and reach behind him to check functional external rotation ROM  MMT Right eval Left eval  Shoulder flexion 5 5  Shoulder extension (prone) 5 4  Shoulder abduction 5 5  Shoulder adduction    Shoulder internal rotation 5 5  Shoulder external rotation (sitting) 5 3+  Middle trap (prone) 5 4-  Low trap (prone) 4 3-  Elbow flexion 5 5  Elbow extension 5 5   (Blank rows = not tested)   LOWER EXTREMITY MMT:    MMT Right eval Left eval  Hip flexion 4 3+  Hip extension 4 3+  Hip abduction 4 3+  Hip adduction    Hip internal rotation    Hip external rotation    Knee flexion 4 4-  Knee extension 5 5  Ankle dorsiflexion 5 4  Ankle plantarflexion    Ankle inversion    Ankle eversion     (Blank rows = not tested)  LUMBAR SPECIAL TESTS:  Did not assess  FUNCTIONAL TESTS:  Plank for core strength TBA  GAIT: Distance walked: Into clinic Assistive device utilized: None Level of assistance: Complete Independence  TREATMENT DATE:   08/07/23: Nustep x 6 min level 6 3 way scapular stabilization with blue loop 2x 5 each UE 4D ball rolls with 2 lb plyo ball (light blue) x 20 each direction on each UE - feels in L pecs Doorway stretch 90  deg 2x30 sec, 120 deg 2x30 sec  Prone left shoulder extension with 2 lbs 2 x 10 Prone left shoulder row with 2 lb 2 x 10 Prone left shoulder horizontal abduction with 2 lb 2 x 10 Side lying left shoulder ER 2 x 10 with 2 lbs  Manual: Trigger Point Dry Needling Subsequent Treatment: Instructions provided previously at initial dry  needling treatment.   Patient Verbal Consent Given: Yes Education Handout Provided: Previously Provided Muscles Treated: L UT pectoralis, subscapularis, levator scapularis, ECRB and wrist extensors Electrical Stimulation Performed: No Treatment Response/Outcome: Utilized skilled palpation to identify bony landmarks and trigger points.  Able to illicit twitch response and muscle elongation.  Soft tissue mobilization to muscles needled following DN to further promote tissue elongation and decreased pain.        08/09/23:Pt arrives for aquatic physical therapy. Treatment took place in 3.5-5.5 feet of water. Water temperature was 91 degrees F. Pt entered the pool via independently with mild use of rails. Pt requires buoyancy of water for support and to offload joints with strengthening exercises.  Pt utilizes viscosity of the water required for strengthening. Seated water bench with 75% submersion Pt performed seated LE AROM exercises 20x in all planes, concurrent discussion on pt's current status and goals. 75% depth water walking 10x in each direction with Ue push/pull with hand floats. Heel lifts 15x. Hip 3 way kicks with more focus o the single leg balance today 20x each Bil. High knee march across pool 4 lengths holding hand floats to assist balance..Standing against wall with arms forward and back 10x for core and lat press, then repeated off the wall 10x. Pt held hand floats for resistance. VC to be still.  Seated decompression with large noodle behind.Horseback underwater bicycle 5 min.   08/07/23: L shoulder assessed: negative empty/full cane, negative pain with MMT, some discomfort with IR STGs assessed Manual: Trigger Point Dry Needling  Initial Treatment: Pt instructed on Dry Needling rational, procedures, and possible side effects. Pt instructed to expect mild to moderate muscle soreness later in the day and/or into the next day.  Pt instructed in methods to reduce muscle soreness. Pt  instructed to continue prescribed HEP. Because Dry Needling was performed over or adjacent to a lung field, pt was educated on S/S of pneumothorax and to seek immediate medical attention should they occur.  Patient was educated on signs and symptoms of infection and other risk factors and advised to seek medical attention should they occur.  Patient verbalized understanding of these instructions and education.   Patient Verbal Consent Given: Yes Education Handout Provided: Yes Muscles Treated: L UT, levator, infraspinatus, pectoralis, wrist extensors Electrical Stimulation Performed: No Treatment Response/Outcome: Utilized skilled palpation to identify bony landmarks and trigger points.  Able to illicit twitch response and muscle elongation.  Soft tissue mobilization to muscles needled  following DN to further promote tissue elongation and decreased pain.       08/02/23:Pt arrives for aquatic physical therapy. Treatment took place in 3.5-5.5 feet of water. Water temperature was 91 degrees F. Pt entered the pool via independently with mild use of rails. Pt requires buoyancy of water for support and to offload joints with strengthening exercises.  Pt utilizes viscosity of the wate.r required for strengthening. Seated water bench with 75% submersion Pt performed seated LE AROM exercises 20x in all planes, concurrent discussion/education on water principles and how to use them as well as pt's current status and goals. 75% depth water walking 8x in each direction with running arms. Heel lifts 15x. Hip flex/ext 15x Bil and abduction Bil 15x. VC for control Static marching min asst of pool wall for balance 15x Bil. Standing static with arms forward and back 20x, VC to be still. Seated decompression with large noodle behind.Horseback underwater bicycle 3 min.    PATIENT EDUCATION:  Education details:HEP UPDATE Person educated: Patient Education method: Explanation, Demonstration, and Handouts Education  comprehension: verbalized understanding, returned demonstration, and needs further education  HOME EXERCISE PROGRAM: Access Code: [email or web address] URL: [email or web address] Date: 07/18/2023 Prepared by: Aletha Anderson  Exercises - Child's Pose with Sidebending  - 1 x daily - 7 x weekly - 2 sets - 30 sec hold - Seated Piriformis Stretch  - 1 x daily - 7 x weekly - 2 sets - 30 sec hold - Seated Figure 4 Piriformis Stretch  - 1 x daily - 7 x weekly - 2 sets - 30 sec hold - Supine Figure 4  - 1 x daily - 7 x weekly - 2 sets - 30 sec hold - Supine Piriformis Stretch with Foot on Ground  - 1 x daily - 7 x weekly - 2 sets - 30 sec hold - Standing Hamstring Stretch on Chair  - 1 x daily - 7 x weekly - 1 sets - 3 reps - 30 sec hold - Quadricep Stretch with Chair and Counter Support  - 1 x daily - 7 x weekly - 1 sets - 3 reps - 30 sec hold - Supine Posterior Pelvic Tilt  - 1 x daily - 7 x weekly - 1 sets - 20 reps - Supine 90/90 Alternating Heel Touches with Posterior Pelvic Tilt  - 1 x daily - 7  x weekly - 1 sets - 20 reps - Dead Bug  - 1 x daily - 7 x weekly - 1 sets - 20 reps  ASSESSMENT:  CLINICAL IMPRESSION:  Patient reporting decreased duration of left shoulder symptoms since initial trial DN. He continues to c/o intermittent pain into L wrist extensors today and pain in the left pectoralis muscles. Good relief with doorway stretches which patient reports he has not been doing as frequently lately. Fatigues fairly easily in the left shoulder with 4D roll and 3 way reach. Good response to DN with pt reporting immediate release of forearm tension after wrist extensors were needled. Overall patient is making improvements with less pain and decreased recovery time after working.  OBJECTIVE IMPAIRMENTS: decreased activity tolerance, decreased coordination, decreased endurance, decreased mobility, difficulty walking, decreased ROM, decreased strength, increased fascial restrictions, increased  muscle spasms, impaired flexibility, impaired sensation, impaired tone, impaired UE functional use, improper body mechanics, postural dysfunction, and pain.   ACTIVITY LIMITATIONS: lifting, bending, standing, squatting, sleeping, reach over head, and locomotion level  PARTICIPATION LIMITATIONS: meal prep, cleaning, laundry, shopping, community activity, occupation, and yard work  PERSONAL FACTORS: Age, Fitness, Past/current experiences, Profession, Time since onset of injury/illness/exacerbation, and 1 comorbidity: MS  are also affecting patient's functional outcome.   REHAB POTENTIAL: Good  CLINICAL DECISION MAKING: Evolving/moderate complexity  EVALUATION COMPLEXITY: Moderate   GOALS: Goals reviewed with patient? Yes  SHORT TERM GOALS: Target date: 08/08/2023   Pt will be ind with initial HEP Baseline: Goal status: MET  08/07/23  2.  Pt will have improved lumbar ROM by at least 10% with no pain in all directions Baseline:  Goal status: MET 08/07/23 see objective  3.  Pt will be able to reach behind his head/down his back without L shoulder pain  Baseline:  Goal status: INITIAL  4.  Pt will report decrease in overall pain by at least 25% with activities Baseline:  Goal status: MET 08/07/23 back   LONG TERM GOALS: Target date: 09/05/2023   Pt will be ind with management and progression of HEP to transition to community wellness/gym activities again Baseline:  Goal status: INITIAL  2.  Pt will demo at least 4/5 L posterior shoulder strength for improved UE movement/endurance for conducting Baseline:  Goal status: INITIAL  3.  Pt will demo at least a 1/2 muscle grade increase with bilat LE strength to improve standing and walking tolerance Baseline:  Goal status: INITIAL  4.  Pt will be able to lift and carry at least 20# with manageable back pain for home and community tasks Baseline:  Goal status: INITIAL  5.  Pt will have improved Modified Oswestry to </=30% to  demo MCID Baseline:  Goal status: INITIAL   PLAN:  PT FREQUENCY: 2x/week  PT DURATION: 8 weeks  PLANNED INTERVENTIONS: 97164- PT Re-evaluation, 97110-Therapeutic exercises, 97530- Therapeutic activity, 97112- Neuromuscular re-education, 97535- Self Care, [id number]- Manual therapy, [id number]- Gait training, [phone]- Aquatic Therapy, [id number]- Electrical stimulation (unattended), [id number]- Ultrasound, [id number]- Traction (mechanical), [id number]- Ionotophoresis 4mg /ml Dexamethasone , Patient/Family education, Balance training, Stair training, Taping, Dry Needling, Joint mobilization, Spinal mobilization, Cryotherapy, and Moist heat.  PLAN FOR NEXT SESSION: [place]: Check remaining STG and LTGs. Progress HEP as indicated. Assess response to Manual therapy/dry needling and continue as indicated . continue posterior shoulder girdle strengthening/stretching. Continue to stretch and strengthen hips/core. May need further SIJ assessment.  Jinx Mourning, PT  08/14/23 10:47 AM    [facility] [address] [place],  [place] [address] Phone # [phone] Fax [phone]

## 2023-08-16 ENCOUNTER — Encounter: Admitting: Physical Therapy

## 2023-08-16 ENCOUNTER — Encounter: Payer: Self-pay | Admitting: Physical Therapy

## 2023-08-16 ENCOUNTER — Ambulatory Visit: Admitting: Physical Therapy

## 2023-08-16 DIAGNOSIS — M25551 Pain in right hip: Secondary | ICD-10-CM | POA: Diagnosis not present

## 2023-08-16 DIAGNOSIS — M6281 Muscle weakness (generalized): Secondary | ICD-10-CM | POA: Diagnosis not present

## 2023-08-16 DIAGNOSIS — M5459 Other low back pain: Secondary | ICD-10-CM

## 2023-08-16 DIAGNOSIS — M25552 Pain in left hip: Secondary | ICD-10-CM | POA: Diagnosis not present

## 2023-08-16 DIAGNOSIS — M25512 Pain in left shoulder: Secondary | ICD-10-CM | POA: Diagnosis not present

## 2023-08-16 DIAGNOSIS — G8929 Other chronic pain: Secondary | ICD-10-CM

## 2023-08-16 DIAGNOSIS — R293 Abnormal posture: Secondary | ICD-10-CM | POA: Diagnosis not present

## 2023-08-16 DIAGNOSIS — R2689 Other abnormalities of gait and mobility: Secondary | ICD-10-CM

## 2023-08-16 DIAGNOSIS — R262 Difficulty in walking, not elsewhere classified: Secondary | ICD-10-CM

## 2023-08-16 DIAGNOSIS — R252 Cramp and spasm: Secondary | ICD-10-CM | POA: Diagnosis not present

## 2023-08-16 NOTE — Therapy (Signed)
 OUTPATIENT PHYSICAL THERAPY THORACOLUMBAR TREATMENT NOTE   Patient Name: Cameron [name] MRN: [id number] DOB:11-28-1955, 68 y.o., male Today's Date: 08/16/2023  END OF SESSION:  PT End of Session - 08/16/23 1018     Visit Number 10    Number of Visits 16    Date for PT Re-Evaluation 09/05/23    Authorization Type [employer]    PT Start Time 0845    PT Stop Time 0930    PT Time Calculation (min) 45 min    Activity Tolerance Patient tolerated treatment well    Behavior During Therapy [facility] for tasks assessed/performed                   Past Medical History:  Diagnosis Date   Abnormal MRI 2007   nonspecific white matter abnormalities   Allergy    seasonal   Anxiety    no meds   Arthritis    spine   Cataract    GERD (gastroesophageal reflux disease)    Helicobacter pylori gastritis 07/20/2023        History of kidney stones    x 2 episodes- last 2020   Hx of adenomatous colonic polyps 09/02/2021   2 diminutive adenomas recall 2030   Idiopathic scoliosis    Migraines    OCULAR connected to MS   Multiple sclerosis (HCC)    Neuromuscular disorder (HCC)    MS - mild left side spasicity   Vertigo    1 episode.  approx 12/2021.  Lasted 2-3 days.   Past Surgical History:  Procedure Laterality Date   CATARACT EXTRACTION W/PHACO Right 06/28/2023   Procedure: CATARACT EXTRACTION PHACO AND INTRAOCULAR LENS PLACEMENT (IOC) RIGHT  6.17  00:42.8;  Surgeon: Annell Kidney, MD;  Location: [facility];  Service: Ophthalmology;  Laterality: Right;   COLONOSCOPY  06/27/2007   Normal   CYSTOSCOPY/URETEROSCOPY/HOLMIUM LASER/STENT PLACEMENT Right 07/27/2018   Procedure: CYSTOSCOPY/URETEROSCOPY/HOLMIUM LASER/STENT PLACEMENT;  Surgeon: Geraline Knapp, MD;  Location: [facility];  Service: Urology;  Laterality: Right;   LUMBAR DISC SURGERY     URETEROLITHOTOMY  11/16/2001   US  ECHOCARDIOGRAPHY  11/16/2004   normal, Carotids normal   Patient Active Problem  List   Diagnosis Date Noted   Tendonitis of shoulder 08/10/2023   Anterior shin splints 08/10/2023   Helicobacter pylori gastritis 07/20/2023   Early satiety 06/06/2023   Prostate hypertrophy 07/12/2022   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   Hx of adenomatous colonic polyps 09/02/2021   Right optic neuritis 02/04/2021   Lumbar radiculopathy 06/25/2020   Other fatigue 06/25/2020   Lumbar disc herniation 05/01/2020   High risk medication use 11/04/2019   Vitamin D  deficiency 11/04/2019   Left foot drop 11/04/2019   Vision disturbance 11/04/2019   Abnormal MRI of head 09/24/2019   Ureteral calculus 07/25/2018   Hydronephrosis with urinary obstruction due to ureteral calculus 07/25/2018   Sensorineural hearing loss (SNHL), bilateral 03/06/2017   Low back pain 05/29/2014   Routine general medical examination at a health care facility 02/23/2012   GERD (gastroesophageal reflux disease)    Hyperlipemia 10/03/2007   Seasonal allergic rhinitis due to pollen 07/19/2006   RENAL CALCULUS 07/19/2006   Multiple sclerosis (HCC) 10/16/2005    PCP: Helaine Llanos, MD  REFERRING PROVIDER: Assunta Lax, PA-C  REFERRING DIAG: [phone] (ICD-10-CM) - Spondylosis without myelopathy or radiculopathy, lumbar region  Rationale for Evaluation and Treatment: Rehabilitation  THERAPY DIAG:  Other low back pain  Muscle weakness (  generalized)  Cramp and spasm  Abnormal posture  Chronic left shoulder pain  Pain in right hip  Pain in left hip  Difficulty in walking, not elsewhere classified  Other abnormalities of gait and mobility  ONSET DATE: Chronic; at least 2022  SUBJECTIVE:                                                                                                                                                                                           SUBJECTIVE STATEMENT: Pt complains of LT radicular symptoms from medial axilla to  forearm with tingles in his finer tips. This worsens with stretching the arm out.  PERTINENT HISTORY:  L2-L3 ablation, discectomy, MS  PAIN:  07/18/23 Are you having pain? Left shoulder is 3-4/10 in left medial axilla to elbow laterally to tingles in finger tips.     PRECAUTIONS: None  RED FLAGS: None   WEIGHT BEARING RESTRICTIONS: No  FALLS:  Has patient fallen in last 6 months? No  LIVING ENVIRONMENT: Lives with: lives with their family, husband Lives in: House/apartment Stairs: No Has following equipment at home: None  OCCUPATION: Programmer, multimedia  PLOF: Independent  PATIENT GOALS: "I would love to be able to do a moderate workout every day and stretching" (Not sure if the stretching he's doing is helping or hurting). "I want to be more active and feel secure with activity" Wants to learn more about energy conservation for his MS, decrease pain levels in general  NEXT MD VISIT: n/a  OBJECTIVE:  Note: Objective measures were completed at Evaluation unless otherwise noted.  DIAGNOSTIC FINDINGS:  11/29/22 Brain MRI IMPRESSION: This MRI of the brain with and without contrast shows the following: Multiple T2/FLAIR hyperintense foci in the cerebral hemispheres in a pattern consistent with chronic demyelinating plaque associated with multiple sclerosis.  None of the foci enhanced or appear to be acute.  Compared to the MRI from 12/28/2021, there were no new lesions. No acute findings.  Normal enhancement pattern.  PATIENT SURVEYS:  Modified Oswestry Low Back Pain Disability Questionnaire: 20 / 50 = 40.0 %  COGNITION: Overall cognitive status: Within functional limits for tasks assessed     SENSATION: L finger N/T; "drop foot" on L with neuropathy  MUSCLE LENGTH: Did not assess on eval Figure 4 stretch is tighter on R hip in seated vs L hip  POSTURE: rounded shoulders and forward head  PALPATION: TTP L>R with trigger points: UT, mid/low trap, infraspinatus, lats/teres minor  and major  TTP L>R glute med and hamstrings with trigger points, R>L QL with trigger points. Did not seem particularly tender with SIJ PAs  LUMBAR ROM:  AROM eval 08/07/23  Flexion 75% 90%  Extension 25% (felt good) full  Right lateral flexion 50%* WNL  Left lateral flexion 30% WNL tight on R side  Right rotation 75% WNL  Left rotation 75% WNL   (Blank rows = not tested, * = pain)  UPPER EXTREMITY MMT: most painful when asked to place his hands behind his head and reach behind him to check functional external rotation ROM  MMT Right eval Left eval  Shoulder flexion 5 5  Shoulder extension (prone) 5 4  Shoulder abduction 5 5  Shoulder adduction    Shoulder internal rotation 5 5  Shoulder external rotation (sitting) 5 3+  Middle trap (prone) 5 4-  Low trap (prone) 4 3-  Elbow flexion 5 5  Elbow extension 5 5   (Blank rows = not tested)   LOWER EXTREMITY MMT:    MMT Right eval Left eval  Hip flexion 4 3+  Hip extension 4 3+  Hip abduction 4 3+  Hip adduction    Hip internal rotation    Hip external rotation    Knee flexion 4 4-  Knee extension 5 5  Ankle dorsiflexion 5 4  Ankle plantarflexion    Ankle inversion    Ankle eversion     (Blank rows = not tested)  LUMBAR SPECIAL TESTS:  Did not assess  FUNCTIONAL TESTS:  Plank for core strength TBA  GAIT: Distance walked: Into clinic Assistive device utilized: None Level of assistance: Complete Independence  TREATMENT DATE:   08/16/23:Pt arrives for aquatic physical therapy. Treatment took place in 3.5-5.5 feet of water. Water temperature was 91 degrees F. Pt entered the pool via independently with mild use of rails. Pt requires buoyancy of water for support and to offload joints with strengthening exercises.  Pt utilizes viscosity of the water required for strengthening. Seated water bench with 75% submersion Pt performed seated LE AROM exercises 20x in all planes, concurrent discussion on pt's current status  and goals. 75% depth water walking 10x in each direction with natural arm swing. Heel lifts 15x. Hip 3 way kicks with more focus o the single leg balance today 20x each Bil. High knee march across pool 4 lengths holding hand floats to assist balance. Attempted arm press with same hand floats but this seemed to aggrevate the elbow, stopped exercise. Standing posture with VC for cervical retraction, hold 3-5 sec 5x. Seated decompression with large noodle behind.Horseback underwater bicycle 7 min.    08/07/23: Nustep x 6 min level 6 3 way scapular stabilization with blue loop 2x 5 each UE 4D ball rolls with 2 lb plyo ball (light blue) x 20 each direction on each UE - feels in L pecs Doorway stretch 90  deg 2x30 sec, 120 deg 2x30 sec  Prone left shoulder extension with 2 lbs 2 x 10 Prone left shoulder row with 2 lb 2 x 10 Prone left shoulder horizontal abduction with 2 lb 2 x 10 Side lying left shoulder ER 2 x 10 with 2 lbs  Manual: Trigger Point Dry Needling Subsequent Treatment: Instructions provided previously at initial dry needling treatment.   Patient Verbal Consent Given: Yes Education Handout Provided: Previously Provided Muscles Treated: L UT pectoralis, subscapularis, levator scapularis, ECRB and wrist extensors Electrical Stimulation Performed: No Treatment Response/Outcome: Utilized skilled palpation to identify bony landmarks and trigger points.  Able to illicit twitch response and muscle elongation.  Soft tissue mobilization to muscles needled following DN to further promote tissue elongation and  decreased pain.        08/09/23:Pt arrives for aquatic physical therapy. Treatment took place in 3.5-5.5 feet of water. Water temperature was 91 degrees F. Pt entered the pool via independently with mild use of rails. Pt requires buoyancy of water for support and to offload joints with strengthening exercises.  Pt utilizes viscosity of the water required for strengthening. Seated water  bench with 75% submersion Pt performed seated LE AROM exercises 20x in all planes, concurrent discussion on pt's current status and goals. 75% depth water walking 10x in each direction with Ue push/pull with hand floats. Heel lifts 15x. Hip 3 way kicks with more focus o the single leg balance today 20x each Bil. High knee march across pool 4 lengths holding hand floats to assist balance..Standing against wall with arms forward and back 10x for core and lat press, then repeated off the wall 10x. Pt held hand floats for resistance. VC to be still. Seated decompression with large noodle behind.Horseback underwater bicycle 5 min.    PATIENT EDUCATION:  Education details:HEP UPDATE Person educated: Patient Education method: Explanation, Demonstration, and Handouts Education comprehension: verbalized understanding, returned demonstration, and needs further education  HOME EXERCISE PROGRAM: Access Code: [email or web address] URL: [email or web address] Date: 07/18/2023 Prepared by: Aletha Anderson  Exercises - Child's Pose with Sidebending  - 1 x daily - 7 x weekly - 2 sets - 30 sec hold - Seated Piriformis Stretch  - 1 x daily - 7 x weekly - 2 sets - 30 sec hold - Seated Figure 4 Piriformis Stretch  - 1 x daily - 7 x weekly - 2 sets - 30 sec hold - Supine Figure 4  - 1 x daily - 7 x weekly - 2 sets - 30 sec hold - Supine Piriformis Stretch with Foot on Ground  - 1 x daily - 7 x weekly - 2 sets - 30 sec hold - Standing Hamstring Stretch on Chair  - 1 x daily - 7 x weekly - 1 sets - 3 reps - 30 sec hold - Quadricep Stretch with Chair and Counter Support  - 1 x daily - 7 x weekly - 1 sets - 3 reps - 30 sec hold - Supine Posterior Pelvic Tilt  - 1 x daily - 7 x weekly - 1 sets - 20 reps - Supine 90/90 Alternating Heel Touches with Posterior Pelvic Tilt  - 1 x daily - 7 x weekly - 1 sets - 20 reps - Dead Bug  - 1 x daily - 7 x weekly - 1 sets - 20 reps  ASSESSMENT:  CLINICAL IMPRESSION:  Pt  educated on why he does each each exercise in the pool: MS symptom management, balance, lumbar stabilization, etc..the patient reports his balance feel really good (connected) after his aquatic exercise. He is concerned with LT arm symptoms. He is paying more attention to his posture and plans to work on getting his ears aligned better over his shoulders.  OBJECTIVE IMPAIRMENTS: decreased activity tolerance, decreased coordination, decreased endurance, decreased mobility, difficulty walking, decreased ROM, decreased strength, increased fascial restrictions, increased muscle spasms, impaired flexibility, impaired sensation, impaired tone, impaired UE functional use, improper body mechanics, postural dysfunction, and pain.   ACTIVITY LIMITATIONS: lifting, bending, standing, squatting, sleeping, reach over head, and locomotion level  PARTICIPATION LIMITATIONS: meal prep, cleaning, laundry, shopping, community activity, occupation, and yard work  PERSONAL FACTORS: Age, Fitness, Past/current experiences, Profession, Time since onset of injury/illness/exacerbation, and 1 comorbidity: MS  are also affecting patient's functional  outcome.   REHAB POTENTIAL: Good  CLINICAL DECISION MAKING: Evolving/moderate complexity  EVALUATION COMPLEXITY: Moderate   GOALS: Goals reviewed with patient? Yes  SHORT TERM GOALS: Target date: 08/08/2023   Pt will be ind with initial HEP Baseline: Goal status: MET  08/07/23  2.  Pt will have improved lumbar ROM by at least 10% with no pain in all directions Baseline:  Goal status: MET 08/07/23 see objective  3.  Pt will be able to reach behind his head/down his back without L shoulder pain  Baseline:  Goal status: INITIAL  4.  Pt will report decrease in overall pain by at least 25% with activities Baseline:  Goal status: MET 08/07/23 back   LONG TERM GOALS: Target date: 09/05/2023   Pt will be ind with management and progression of HEP to transition to  community wellness/gym activities again Baseline:  Goal status: INITIAL  2.  Pt will demo at least 4/5 L posterior shoulder strength for improved UE movement/endurance for conducting Baseline:  Goal status: INITIAL  3.  Pt will demo at least a 1/2 muscle grade increase with bilat LE strength to improve standing and walking tolerance Baseline:  Goal status: INITIAL  4.  Pt will be able to lift and carry at least 20# with manageable back pain for home and community tasks Baseline:  Goal status: INITIAL  5.  Pt will have improved Modified Oswestry to </=30% to demo MCID Baseline:  Goal status: INITIAL   PLAN:  PT FREQUENCY: 2x/week  PT DURATION: 8 weeks  PLANNED INTERVENTIONS: 97164- PT Re-evaluation, 97110-Therapeutic exercises, 97530- Therapeutic activity, 97112- Neuromuscular re-education, 97535- Self Care, [id number]- Manual therapy, [id number]- Gait training, [phone]- Aquatic Therapy, [phone]- Electrical stimulation (unattended), [id number]- Ultrasound, [id number]- Traction (mechanical), [id number]- Ionotophoresis 4mg /ml Dexamethasone , Patient/Family education, Balance training, Stair training, Taping, Dry Needling, Joint mobilization, Spinal mobilization, Cryotherapy, and Moist heat.  PLAN FOR NEXT SESSION: Continue to address forward head while pt performs his aquatic exercises. ERO coming up?  Bethanne Brooks, PTA 08/16/23 10:19 AM   [facility] [address], [address] [place], [place] [address] Phone # [phone] Fax [phone]

## 2023-08-21 ENCOUNTER — Ambulatory Visit: Attending: Surgery | Admitting: Physical Therapy

## 2023-08-21 ENCOUNTER — Encounter: Payer: Self-pay | Admitting: Physical Therapy

## 2023-08-21 ENCOUNTER — Other Ambulatory Visit: Payer: Self-pay | Admitting: Neurology

## 2023-08-21 DIAGNOSIS — R293 Abnormal posture: Secondary | ICD-10-CM | POA: Insufficient documentation

## 2023-08-21 DIAGNOSIS — M25552 Pain in left hip: Secondary | ICD-10-CM | POA: Insufficient documentation

## 2023-08-21 DIAGNOSIS — M6281 Muscle weakness (generalized): Secondary | ICD-10-CM | POA: Insufficient documentation

## 2023-08-21 DIAGNOSIS — R252 Cramp and spasm: Secondary | ICD-10-CM | POA: Insufficient documentation

## 2023-08-21 DIAGNOSIS — R2689 Other abnormalities of gait and mobility: Secondary | ICD-10-CM | POA: Diagnosis not present

## 2023-08-21 DIAGNOSIS — M25512 Pain in left shoulder: Secondary | ICD-10-CM | POA: Diagnosis not present

## 2023-08-21 DIAGNOSIS — M5459 Other low back pain: Secondary | ICD-10-CM | POA: Insufficient documentation

## 2023-08-21 DIAGNOSIS — G8929 Other chronic pain: Secondary | ICD-10-CM | POA: Diagnosis not present

## 2023-08-21 MED ORDER — METHYLPHENIDATE HCL 20 MG PO TABS: ORAL_TABLET | ORAL | 0 refills | Status: DC

## 2023-08-21 NOTE — Therapy (Signed)
 OUTPATIENT PHYSICAL THERAPY THORACOLUMBAR TREATMENT NOTE AND PROGRESS NOTE  Reporting Period 07/11/23 to 08/21/23   See note below for Objective Data and Assessment of Progress/Goals.    Patient Name: Cameron [name] MRN: [id number] DOB:Jun 06, 1955, 68 y.o., male Today's Date: 08/21/2023  END OF SESSION:  PT End of Session - 08/21/23 0928     Visit Number 11    Number of Visits 16    Date for PT Re-Evaluation 09/05/23    Authorization Type [employer]    Authorization Time Period approved 16 visits 3/25 to 5/20    Authorization - Visit Number 11    Authorization - Number of Visits 16    PT Start Time 0928    PT Stop Time 1017    PT Time Calculation (min) 49 min    Activity Tolerance Patient tolerated treatment well    Behavior During Therapy [facility] for tasks assessed/performed                    Past Medical History:  Diagnosis Date   Abnormal MRI 2007   nonspecific white matter abnormalities   Allergy    seasonal   Anxiety    no meds   Arthritis    spine   Cataract    GERD (gastroesophageal reflux disease)    Helicobacter pylori gastritis 07/20/2023        History of kidney stones    x 2 episodes- last 2020   Hx of adenomatous colonic polyps 09/02/2021   2 diminutive adenomas recall 2030   Idiopathic scoliosis    Migraines    OCULAR connected to MS   Multiple sclerosis (HCC)    Neuromuscular disorder (HCC)    MS - mild left side spasicity   Vertigo    1 episode.  approx 12/2021.  Lasted 2-3 days.   Past Surgical History:  Procedure Laterality Date   CATARACT EXTRACTION W/PHACO Right 06/28/2023   Procedure: CATARACT EXTRACTION PHACO AND INTRAOCULAR LENS PLACEMENT (IOC) RIGHT  6.17  00:42.8;  Surgeon: Annell Kidney, MD;  Location: [facility];  Service: Ophthalmology;  Laterality: Right;   COLONOSCOPY  06/27/2007   Normal   CYSTOSCOPY/URETEROSCOPY/HOLMIUM LASER/STENT PLACEMENT Right 07/27/2018   Procedure:  CYSTOSCOPY/URETEROSCOPY/HOLMIUM LASER/STENT PLACEMENT;  Surgeon: Geraline Knapp, MD;  Location: [facility];  Service: Urology;  Laterality: Right;   LUMBAR DISC SURGERY     URETEROLITHOTOMY  11/16/2001   US  ECHOCARDIOGRAPHY  11/16/2004   normal, Carotids normal   Patient Active Problem List   Diagnosis Date Noted   Tendonitis of shoulder 08/10/2023   Anterior shin splints 08/10/2023   Helicobacter pylori gastritis 07/20/2023   Early satiety 06/06/2023   Prostate hypertrophy 07/12/2022   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   Hx of adenomatous colonic polyps 09/02/2021   Right optic neuritis 02/04/2021   Lumbar radiculopathy 06/25/2020   Other fatigue 06/25/2020   Lumbar disc herniation 05/01/2020   High risk medication use 11/04/2019   Vitamin D  deficiency 11/04/2019   Left foot drop 11/04/2019   Vision disturbance 11/04/2019   Abnormal MRI of head 09/24/2019   Ureteral calculus 07/25/2018   Hydronephrosis with urinary obstruction due to ureteral calculus 07/25/2018   Sensorineural hearing loss (SNHL), bilateral 03/06/2017   Low back pain 05/29/2014   Routine general medical examination at a health care facility 02/23/2012   GERD (gastroesophageal reflux disease)    Hyperlipemia 10/03/2007   Seasonal allergic rhinitis due to pollen 07/19/2006   RENAL CALCULUS 07/19/2006  Multiple sclerosis (HCC) 10/16/2005    PCP: Helaine Llanos, MD  REFERRING PROVIDER: Assunta Lax, PA-C  REFERRING DIAG: [phone] (ICD-10-CM) - Spondylosis without myelopathy or radiculopathy, lumbar region  Rationale for Evaluation and Treatment: Rehabilitation  THERAPY DIAG:  Other low back pain  Muscle weakness (generalized)  Cramp and spasm  Abnormal posture  Chronic left shoulder pain  ONSET DATE: Chronic; at least 2022  SUBJECTIVE:                                                                                                                                                                                            SUBJECTIVE STATEMENT: Patient reporting tingling in all fingertips now. The back is in much better shape. I'm most concerned with the lower arm and hand. Still having pain in the left shoulder. Did get relief with hand shoulder behind his head. The knot under my shoulder blade has gotten worse. Walking jars the shoulder, but the legs are good.  PERTINENT HISTORY:  L2-L3 ablation, discectomy, MS  PAIN:  07/18/23 Are you having pain? Left shoulder is 4/10 in left medial axilla to elbow laterally to tingles in finger tips.     PRECAUTIONS: None  RED FLAGS: None   WEIGHT BEARING RESTRICTIONS: No  FALLS:  Has patient fallen in last 6 months? No  LIVING ENVIRONMENT: Lives with: lives with their family, husband Lives in: House/apartment Stairs: No Has following equipment at home: None  OCCUPATION: Programmer, multimedia  PLOF: Independent  PATIENT GOALS: "I would love to be able to do a moderate workout every day and stretching" (Not sure if the stretching he's doing is helping or hurting). "I want to be more active and feel secure with activity" Wants to learn more about energy conservation for his MS, decrease pain levels in general  NEXT MD VISIT: n/a  OBJECTIVE:  Note: Objective measures were completed at Evaluation unless otherwise noted.  DIAGNOSTIC FINDINGS:  11/29/22 Brain MRI IMPRESSION: This MRI of the brain with and without contrast shows the following: Multiple T2/FLAIR hyperintense foci in the cerebral hemispheres in a pattern consistent with chronic demyelinating plaque associated with multiple sclerosis.  None of the foci enhanced or appear to be acute.  Compared to the MRI from 12/28/2021, there were no new lesions. No acute findings.  Normal enhancement pattern.  PATIENT SURVEYS:  Modified Oswestry Low Back Pain Disability Questionnaire: 20 / 50 = 40.0 %  COGNITION: Overall cognitive status: Within  functional limits for tasks assessed     SENSATION: L finger N/T; "drop foot" on L with neuropathy  MUSCLE LENGTH: Did not assess on eval Figure 4  stretch is tighter on R hip in seated vs L hip  POSTURE: rounded shoulders and forward head  PALPATION: TTP L>R with trigger points: UT, mid/low trap, infraspinatus, lats/teres minor and major  TTP L>R glute med and hamstrings with trigger points, R>L QL with trigger points. Did not seem particularly tender with SIJ PAs  LUMBAR ROM:   AROM eval 08/07/23  Flexion 75% 90%  Extension 25% (felt good) full  Right lateral flexion 50%* WNL  Left lateral flexion 30% WNL tight on R side  Right rotation 75% WNL  Left rotation 75% WNL   (Blank rows = not tested, * = pain)  UPPER EXTREMITY MMT: most painful when asked to place his hands behind his head and reach behind him to check functional external rotation ROM  MMT Right eval Left eval Left  08/21/23 Right 5/5  Shoulder flexion 5 5 5    Shoulder extension (prone) 5 4 4+standing   Shoulder abduction 5 5 4+   Shoulder adduction      Shoulder internal rotation 5 5 5    Shoulder external rotation (sitting) 5 3+ 5    Middle trap (prone) 5 4- 5 5  Low trap (prone) 4 3- 4+ 4+  Elbow flexion 5 5    Elbow extension 5 5     (Blank rows = not tested)   LOWER EXTREMITY MMT:    MMT Right eval Left eval Right 08/21/23 Left  08/21/23  Hip flexion 4 3+ 5 4+  Hip extension 4 3+ 5 5  Hip abduction 4 3+ 5 5  Hip adduction      Hip internal rotation      Hip external rotation      Knee flexion 4 4- 5 4-  Knee extension 5 5 5 5   Ankle dorsiflexion 5 4 5 5   Ankle plantarflexion      Ankle inversion      Ankle eversion       (Blank rows = not tested)  LUMBAR SPECIAL TESTS:  Did not assess  FUNCTIONAL TESTS:  Plank for core strength TBA  GAIT: Distance walked: Into clinic Assistive device utilized: None Level of assistance: Complete Independence  08/21/23 CERVICAL SPECIAL TESTS:   Upper limb tension test (ULTT): negative but does get some forearm tightness after testing, Spurling's test: Negative, and Distraction test: causes increased pain under arm pit and into foream    TREATMENT DATE:   08/21/23: Nustep x 6 min level 6 Wrist flexor stretch 2x30 sec Goals and neck assessed- see objective Pain with lateral SB R Manual: Trigger Point Dry Needling Subsequent Treatment: Instructions provided previously at initial dry needling treatment.   Patient Verbal Consent Given: Yes Education Handout Provided: Previously Provided Muscles Treated: L rhomboids, UT, LS and multifidi Electrical Stimulation Performed: No Treatment Response/Outcome: Utilized skilled palpation to identify bony landmarks and trigger points.  Able to illicit twitch response and muscle elongation.  Soft tissue mobilization to muscles needled following DN to further promote tissue elongation and decreased pain.     08/16/23:Pt arrives for aquatic physical therapy. Treatment took place in 3.5-5.5 feet of water. Water temperature was 91 degrees F. Pt entered the pool via independently with mild use of rails. Pt requires buoyancy of water for support and to offload joints with strengthening exercises.  Pt utilizes viscosity of the water required for strengthening. Seated water bench with 75% submersion Pt performed seated LE AROM exercises 20x in all planes, concurrent discussion on pt's current status and goals. 75%  depth water walking 10x in each direction with natural arm swing. Heel lifts 15x. Hip 3 way kicks with more focus o the single leg balance today 20x each Bil. High knee march across pool 4 lengths holding hand floats to assist balance. Attempted arm press with same hand floats but this seemed to aggrevate the elbow, stopped exercise. Standing posture with VC for cervical retraction, hold 3-5 sec 5x. Seated decompression with large noodle behind.Horseback underwater bicycle 7 min.     08/07/23: Nustep x 6 min level 6 3 way scapular stabilization with blue loop 2x 5 each UE 4D ball rolls with 2 lb plyo ball (light blue) x 20 each direction on each UE - feels in L pecs Doorway stretch 90  deg 2x30 sec, 120 deg 2x30 sec  Prone left shoulder extension with 2 lbs 2 x 10 Prone left shoulder row with 2 lb 2 x 10 Prone left shoulder horizontal abduction with 2 lb 2 x 10 Side lying left shoulder ER 2 x 10 with 2 lbs  Manual: Trigger Point Dry Needling Subsequent Treatment: Instructions provided previously at initial dry needling treatment.   Patient Verbal Consent Given: Yes Education Handout Provided: Previously Provided Muscles Treated: L UT pectoralis, subscapularis, levator scapularis, ECRB and wrist extensors Electrical Stimulation Performed: No Treatment Response/Outcome: Utilized skilled palpation to identify bony landmarks and trigger points.  Able to illicit twitch response and muscle elongation.  Soft tissue mobilization to muscles needled following DN to further promote tissue elongation and decreased pain.        08/09/23:Pt arrives for aquatic physical therapy. Treatment took place in 3.5-5.5 feet of water. Water temperature was 91 degrees F. Pt entered the pool via independently with mild use of rails. Pt requires buoyancy of water for support and to offload joints with strengthening exercises.  Pt utilizes viscosity of the water required for strengthening. Seated water bench with 75% submersion Pt performed seated LE AROM exercises 20x in all planes, concurrent discussion on pt's current status and goals. 75% depth water walking 10x in each direction with Ue push/pull with hand floats. Heel lifts 15x. Hip 3 way kicks with more focus o the single leg balance today 20x each Bil. High knee march across pool 4 lengths holding hand floats to assist balance..Standing against wall with arms forward and back 10x for core and lat press, then repeated off the wall 10x. Pt  held hand floats for resistance. VC to be still. Seated decompression with large noodle behind.Horseback underwater bicycle 5 min.    PATIENT EDUCATION:  Education details:ongoing PT POC  Person educated: Patient Education method: Explanation, Demonstration, and Handouts Education comprehension: verbalized understanding, returned demonstration, and needs further education  HOME EXERCISE PROGRAM: Access Code: [email or web address] URL: [email or web address] Date: 07/18/2023 Prepared by: Aletha Anderson  Exercises - Child's Pose with Sidebending  - 1 x daily - 7 x weekly - 2 sets - 30 sec hold - Seated Piriformis Stretch  - 1 x daily - 7 x weekly - 2 sets - 30 sec hold - Seated Figure 4 Piriformis Stretch  - 1 x daily - 7 x weekly - 2 sets - 30 sec hold - Supine Figure 4  - 1 x daily - 7 x weekly - 2 sets - 30 sec hold - Supine Piriformis Stretch with Foot on Ground  - 1 x daily - 7 x weekly - 2 sets - 30 sec hold - Standing Hamstring Stretch on Chair  - 1 x daily - 7 x  weekly - 1 sets - 3 reps - 30 sec hold - Quadricep Stretch with Chair and Counter Support  - 1 x daily - 7 x weekly - 1 sets - 3 reps - 30 sec hold - Supine Posterior Pelvic Tilt  - 1 x daily - 7 x weekly - 1 sets - 20 reps - Supine 90/90 Alternating Heel Touches with Posterior Pelvic Tilt  - 1 x daily - 7 x weekly - 1 sets - 20 reps - Dead Bug  - 1 x daily - 7 x weekly - 1 sets - 20 reps  ASSESSMENT:  CLINICAL IMPRESSION: Patient reporting significant improvement in back overall. He is mainly concerned with his new symptoms of tingling in all of his finger tips since last session. He demonstrates negative cervical special tests and ULTT put does have increased sx upon release of radial nerve tension. He gets relief with placing his hand on his head. Increased sx with scapular retraction and pec stretch. Aquatic has helped tremendously. Relief in shoulder immediately after manual therapy today. Patient continues to  demonstrate potential for improvement and would benefit from continued skilled therapy to address impairments.    OBJECTIVE IMPAIRMENTS: decreased activity tolerance, decreased coordination, decreased endurance, decreased mobility, difficulty walking, decreased ROM, decreased strength, increased fascial restrictions, increased muscle spasms, impaired flexibility, impaired sensation, impaired tone, impaired UE functional use, improper body mechanics, postural dysfunction, and pain.   ACTIVITY LIMITATIONS: lifting, bending, standing, squatting, sleeping, reach over head, and locomotion level  PARTICIPATION LIMITATIONS: meal prep, cleaning, laundry, shopping, community activity, occupation, and yard work  PERSONAL FACTORS: Age, Fitness, Past/current experiences, Profession, Time since onset of injury/illness/exacerbation, and 1 comorbidity: MS  are also affecting patient's functional outcome.   REHAB POTENTIAL: Good  CLINICAL DECISION MAKING: Evolving/moderate complexity  EVALUATION COMPLEXITY: Moderate   GOALS: Goals reviewed with patient? Yes  SHORT TERM GOALS: Target date: 08/08/2023   Pt will be ind with initial HEP Baseline: Goal status: MET  08/07/23  2.  Pt will have improved lumbar ROM by at least 10% with no pain in all directions Baseline:  Goal status: MET 08/07/23 see objective  3.  Pt will be able to reach behind his head/down his back without L shoulder pain  Baseline:  Goal status: MET 08/21/23  4.  Pt will report decrease in overall pain by at least 25% with activities Baseline:  Goal status: MET 08/07/23 back   LONG TERM GOALS: Target date: 09/05/2023   Pt will be ind with management and progression of HEP to transition to community wellness/gym activities again Baseline:  Goal status: IN PROGRESS 5/5  2.  Pt will demo at least 4/5 L posterior shoulder strength for improved UE movement/endurance for conducting Baseline:  Goal status: MET 5/5  3.  Pt will demo  at least a 1/2 muscle grade increase with bilat LE strength to improve standing and walking tolerance Baseline:  Goal status: IN PROGRESS 5/5  4.  Pt will be able to lift and carry at least 20# with manageable back pain for home and community tasks Baseline:  Goal status: IN PROGRESS 5/5  5.  Pt will have improved Modified Oswestry to </=30% to demo MCID Baseline:  Goal status: IN PROGRESS 5/5   PLAN:  PT FREQUENCY: 2x/week  PT DURATION: 8 weeks  PLANNED INTERVENTIONS: 97164- PT Re-evaluation, 97110-Therapeutic exercises, 97530- Therapeutic activity, [id number]- Neuromuscular re-education, 97535- Self Care, 16109- Manual therapy, [id number]- Gait training, [phone]- Aquatic Therapy, [phone]- Electrical stimulation (unattended), 97035- Ultrasound,  65784- Traction (mechanical), 69629- Ionotophoresis 4mg /ml Dexamethasone , Patient/Family education, Balance training, Stair training, Taping, Dry Needling, Joint mobilization, Spinal mobilization, Cryotherapy, and Moist heat.  PLAN FOR NEXT SESSION: COMPLETE MOD OSWESTRY, Continue to address forward head while pt performs his aquatic exercises.   Jinx Mourning, PT  08/21/23 5:24 PM   [facility] [address] [place], [place] [address] Phone # [phone] Fax [phone]

## 2023-08-21 NOTE — Telephone Encounter (Signed)
 Last seen on 05/24/23 Follow up scheduled on 12/12/23   Dispensed Days Supply Quantity Provider Pharmacy  methylphenidate  20 mg tablet 06/19/2023 30 60 tablet Debbra Fairy, MD [facility].Aaron Aas     Rx pending to be signed

## 2023-08-21 NOTE — Telephone Encounter (Signed)
 Pr request refill for methylphenidate  (RITALIN ) 20 MG tablet send to  [facility] [id number]

## 2023-08-23 ENCOUNTER — Encounter: Payer: Self-pay | Admitting: Physical Therapy

## 2023-08-23 ENCOUNTER — Encounter: Admitting: Physical Therapy

## 2023-08-23 ENCOUNTER — Ambulatory Visit: Admitting: Physical Therapy

## 2023-08-23 DIAGNOSIS — G8929 Other chronic pain: Secondary | ICD-10-CM

## 2023-08-23 DIAGNOSIS — R2689 Other abnormalities of gait and mobility: Secondary | ICD-10-CM

## 2023-08-23 DIAGNOSIS — R252 Cramp and spasm: Secondary | ICD-10-CM

## 2023-08-23 DIAGNOSIS — M25552 Pain in left hip: Secondary | ICD-10-CM

## 2023-08-23 DIAGNOSIS — R293 Abnormal posture: Secondary | ICD-10-CM

## 2023-08-23 DIAGNOSIS — M6281 Muscle weakness (generalized): Secondary | ICD-10-CM | POA: Diagnosis not present

## 2023-08-23 DIAGNOSIS — M5459 Other low back pain: Secondary | ICD-10-CM | POA: Diagnosis not present

## 2023-08-23 DIAGNOSIS — M25512 Pain in left shoulder: Secondary | ICD-10-CM | POA: Diagnosis not present

## 2023-08-23 NOTE — Therapy (Signed)
 OUTPATIENT PHYSICAL THERAPY THORACOLUMBAR TREATMENT NOTE  Patient Name: Cameron [name] MRN: [id number] DOB:July 18, 1955, 68 y.o., male Today's Date: 08/23/2023  END OF SESSION:  PT End of Session - 08/23/23 0940     Visit Number 12    Number of Visits 16    Date for PT Re-Evaluation 09/05/23    Authorization Type [employer]    Authorization Time Period approved 16 visits 3/25 to 5/20    Authorization - Visit Number 12    Authorization - Number of Visits 16    PT Start Time 0845    PT Stop Time 0938    PT Time Calculation (min) 53 min    Activity Tolerance Patient tolerated treatment well    Behavior During Therapy [facility] for tasks assessed/performed                    Past Medical History:  Diagnosis Date   Abnormal MRI 2007   nonspecific white matter abnormalities   Allergy    seasonal   Anxiety    no meds   Arthritis    spine   Cataract    GERD (gastroesophageal reflux disease)    Helicobacter pylori gastritis 07/20/2023        History of kidney stones    x 2 episodes- last 2020   Hx of adenomatous colonic polyps 09/02/2021   2 diminutive adenomas recall 2030   Idiopathic scoliosis    Migraines    OCULAR connected to MS   Multiple sclerosis (HCC)    Neuromuscular disorder (HCC)    MS - mild left side spasicity   Vertigo    1 episode.  approx 12/2021.  Lasted 2-3 days.   Past Surgical History:  Procedure Laterality Date   CATARACT EXTRACTION W/PHACO Right 06/28/2023   Procedure: CATARACT EXTRACTION PHACO AND INTRAOCULAR LENS PLACEMENT (IOC) RIGHT  6.17  00:42.8;  Surgeon: Annell Kidney, MD;  Location: [facility];  Service: Ophthalmology;  Laterality: Right;   COLONOSCOPY  06/27/2007   Normal   CYSTOSCOPY/URETEROSCOPY/HOLMIUM LASER/STENT PLACEMENT Right 07/27/2018   Procedure: CYSTOSCOPY/URETEROSCOPY/HOLMIUM LASER/STENT PLACEMENT;  Surgeon: Geraline Knapp, MD;  Location: [facility];  Service: Urology;  Laterality: Right;   LUMBAR  DISC SURGERY     URETEROLITHOTOMY  11/16/2001   US  ECHOCARDIOGRAPHY  11/16/2004   normal, Carotids normal   Patient Active Problem List   Diagnosis Date Noted   Tendonitis of shoulder 08/10/2023   Anterior shin splints 08/10/2023   Helicobacter pylori gastritis 07/20/2023   Early satiety 06/06/2023   Prostate hypertrophy 07/12/2022   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   Hx of adenomatous colonic polyps 09/02/2021   Right optic neuritis 02/04/2021   Lumbar radiculopathy 06/25/2020   Other fatigue 06/25/2020   Lumbar disc herniation 05/01/2020   High risk medication use 11/04/2019   Vitamin D  deficiency 11/04/2019   Left foot drop 11/04/2019   Vision disturbance 11/04/2019   Abnormal MRI of head 09/24/2019   Ureteral calculus 07/25/2018   Hydronephrosis with urinary obstruction due to ureteral calculus 07/25/2018   Sensorineural hearing loss (SNHL), bilateral 03/06/2017   Low back pain 05/29/2014   Routine general medical examination at a health care facility 02/23/2012   GERD (gastroesophageal reflux disease)    Hyperlipemia 10/03/2007   Seasonal allergic rhinitis due to pollen 07/19/2006   RENAL CALCULUS 07/19/2006   Multiple sclerosis (HCC) 10/16/2005    PCP: Helaine Llanos, MD  REFERRING PROVIDER: Tomlinson, Sara Caylin, PA-C  REFERRING  DIAG: M47.816 (ICD-10-CM) - Spondylosis without myelopathy or radiculopathy, lumbar region  Rationale for Evaluation and Treatment: Rehabilitation  THERAPY DIAG:  Other low back pain  Muscle weakness (generalized)  Cramp and spasm  Abnormal posture  Chronic left shoulder pain  Pain in left hip  Other abnormalities of gait and mobility  ONSET DATE: Chronic; at least 2022  SUBJECTIVE:                                                                                                                                                                                           SUBJECTIVE STATEMENT:  Much better week: LT shoulder showing improvement.  PERTINENT HISTORY:  L2-L3 ablation, discectomy, MS  PAIN:  07/18/23 Are you having pain? Left shoulder is 2/10 in left medial axilla to elbow laterally to tingles in finger tips.     PRECAUTIONS: None  RED FLAGS: None   WEIGHT BEARING RESTRICTIONS: No  FALLS:  Has patient fallen in last 6 months? No  LIVING ENVIRONMENT: Lives with: lives with their family, husband Lives in: House/apartment Stairs: No Has following equipment at home: None  OCCUPATION: Programmer, multimedia  PLOF: Independent  PATIENT GOALS: "I would love to be able to do a moderate workout every day and stretching" (Not sure if the stretching he's doing is helping or hurting). "I want to be more active and feel secure with activity" Wants to learn more about energy conservation for his MS, decrease pain levels in general  NEXT MD VISIT: n/a  OBJECTIVE:  Note: Objective measures were completed at Evaluation unless otherwise noted.  DIAGNOSTIC FINDINGS:  11/29/22 Brain MRI IMPRESSION: This MRI of the brain with and without contrast shows the following: Multiple T2/FLAIR hyperintense foci in the cerebral hemispheres in a pattern consistent with chronic demyelinating plaque associated with multiple sclerosis.  None of the foci enhanced or appear to be acute.  Compared to the MRI from 12/28/2021, there were no new lesions. No acute findings.  Normal enhancement pattern.  PATIENT SURVEYS:  Modified Oswestry Low Back Pain Disability Questionnaire: 20 / 50 = 40.0 %  COGNITION: Overall cognitive status: Within functional limits for tasks assessed     SENSATION: L finger N/T; "drop foot" on L with neuropathy  MUSCLE LENGTH: Did not assess on eval Figure 4 stretch is tighter on R hip in seated vs L hip  POSTURE: rounded shoulders and forward head  PALPATION: TTP L>R with trigger points: UT, mid/low trap, infraspinatus, lats/teres minor and major  TTP L>R glute med  and hamstrings with trigger points, R>L QL with trigger points. Did not seem particularly tender with SIJ PAs  LUMBAR ROM:  AROM eval 08/07/23  Flexion 75% 90%  Extension 25% (felt good) full  Right lateral flexion 50%* WNL  Left lateral flexion 30% WNL tight on R side  Right rotation 75% WNL  Left rotation 75% WNL   (Blank rows = not tested, * = pain)  UPPER EXTREMITY MMT: most painful when asked to place his hands behind his head and reach behind him to check functional external rotation ROM  MMT Right eval Left eval Left  08/21/23 Right 5/5  Shoulder flexion 5 5 5    Shoulder extension (prone) 5 4 4+standing   Shoulder abduction 5 5 4+   Shoulder adduction      Shoulder internal rotation 5 5 5    Shoulder external rotation (sitting) 5 3+ 5    Middle trap (prone) 5 4- 5 5  Low trap (prone) 4 3- 4+ 4+  Elbow flexion 5 5    Elbow extension 5 5     (Blank rows = not tested)   LOWER EXTREMITY MMT:    MMT Right eval Left eval Right 08/21/23 Left  08/21/23  Hip flexion 4 3+ 5 4+  Hip extension 4 3+ 5 5  Hip abduction 4 3+ 5 5  Hip adduction      Hip internal rotation      Hip external rotation      Knee flexion 4 4- 5 4-  Knee extension 5 5 5 5   Ankle dorsiflexion 5 4 5 5   Ankle plantarflexion      Ankle inversion      Ankle eversion       (Blank rows = not tested)  LUMBAR SPECIAL TESTS:  Did not assess  FUNCTIONAL TESTS:  Plank for core strength TBA  GAIT: Distance walked: Into clinic Assistive device utilized: None Level of assistance: Complete Independence  08/21/23 CERVICAL SPECIAL TESTS:  Upper limb tension test (ULTT): negative but does get some forearm tightness after testing, Spurling's test: Negative, and Distraction test: causes increased pain under arm pit and into foream    TREATMENT DATE:   08/23/23:Pt arrives for aquatic physical therapy. Treatment took place in 3.5-5.5 feet of water. Water temperature was 91 degrees F. Pt entered the pool via  independently with mild use of rails. Pt requires buoyancy of water for support and to offload joints with strengthening exercises.  Pt utilizes viscosity of the water required for strengthening. Seated water bench with 75% submersion Pt performed seated LE AROM exercises 20x in all planes, concurrent discussion on pt's current status and goals. 75% depth water walking 10x in each direction with UE push/pull using hand floats. Heel lifts 15x. Hip 3 way kicks with more focus o the single leg balance today 20x each Bi no UE support. . High knee march across pool2 slow lengths while pushing hand floast down by his side. VC for posture. Standing arm press with single buoy hand floats 2x10. Standing posture with VC for cervical retraction, hold 3-5 sec 5x. Seated decompression with large noodle behind 2x for short break, especially when LE got unsteady from fatigue. Horseback underwater bicycle 7 min.    08/21/23: Nustep x 6 min level 6 Wrist flexor stretch 2x30 sec Goals and neck assessed- see objective Pain with lateral SB R Manual: Trigger Point Dry Needling Subsequent Treatment: Instructions provided previously at initial dry needling treatment.   Patient Verbal Consent Given: Yes Education Handout Provided: Previously Provided Muscles Treated: L rhomboids, UT, LS and multifidi Electrical Stimulation Performed: No Treatment Response/Outcome: Utilized skilled  palpation to identify bony landmarks and trigger points.  Able to illicit twitch response and muscle elongation.  Soft tissue mobilization to muscles needled following DN to further promote tissue elongation and decreased pain.     08/16/23:Pt arrives for aquatic physical therapy. Treatment took place in 3.5-5.5 feet of water. Water temperature was 91 degrees F. Pt entered the pool via independently with mild use of rails. Pt requires buoyancy of water for support and to offload joints with strengthening exercises.  Pt utilizes viscosity of the  water required for strengthening. Seated water bench with 75% submersion Pt performed seated LE AROM exercises 20x in all planes, concurrent discussion on pt's current status and goals. 75% depth water walking 10x in each direction with natural arm swing. Heel lifts 15x. Hip 3 way kicks with more focus o the single leg balance today 20x each Bil. High knee march across pool 4 lengths holding hand floats to assist balance. Attempted arm press with same hand floats but this seemed to aggrevate the elbow, stopped exercise. Standing posture with VC for cervical retraction, hold 3-5 sec 5x. Seated decompression with large noodle behind.Horseback underwater bicycle 7 min.    PATIENT EDUCATION:  Education details:ongoing PT POC  Person educated: Patient Education method: Explanation, Demonstration, and Handouts Education comprehension: verbalized understanding, returned demonstration, and needs further education  HOME EXERCISE PROGRAM: Access Code: [email or web address] URL: [email or web address] Date: 07/18/2023 Prepared by: Aletha Anderson  Exercises - Child's Pose with Sidebending  - 1 x daily - 7 x weekly - 2 sets - 30 sec hold - Seated Piriformis Stretch  - 1 x daily - 7 x weekly - 2 sets - 30 sec hold - Seated Figure 4 Piriformis Stretch  - 1 x daily - 7 x weekly - 2 sets - 30 sec hold - Supine Figure 4  - 1 x daily - 7 x weekly - 2 sets - 30 sec hold - Supine Piriformis Stretch with Foot on Ground  - 1 x daily - 7 x weekly - 2 sets - 30 sec hold - Standing Hamstring Stretch on Chair  - 1 x daily - 7 x weekly - 1 sets - 3 reps - 30 sec hold - Quadricep Stretch with Chair and Counter Support  - 1 x daily - 7 x weekly - 1 sets - 3 reps - 30 sec hold - Supine Posterior Pelvic Tilt  - 1 x daily - 7 x weekly - 1 sets - 20 reps - Supine 90/90 Alternating Heel Touches with Posterior Pelvic Tilt  - 1 x daily - 7 x weekly - 1 sets - 20 reps - Dead Bug  - 1 x daily - 7 x weekly - 1 sets - 20  reps  ASSESSMENT:  CLINICAL IMPRESSION: Pt aware of head position throughout aquatic exercise. Working towards more consistency. Focus on single limb balance today which was more difficult of the RTLE than LT. Fatigue absolutely a factor in his stability. No increased arm symptoms today.  OBJECTIVE IMPAIRMENTS: decreased activity tolerance, decreased coordination, decreased endurance, decreased mobility, difficulty walking, decreased ROM, decreased strength, increased fascial restrictions, increased muscle spasms, impaired flexibility, impaired sensation, impaired tone, impaired UE functional use, improper body mechanics, postural dysfunction, and pain.   ACTIVITY LIMITATIONS: lifting, bending, standing, squatting, sleeping, reach over head, and locomotion level  PARTICIPATION LIMITATIONS: meal prep, cleaning, laundry, shopping, community activity, occupation, and yard work  PERSONAL FACTORS: Age, Fitness, Past/current experiences, Profession, Time since onset of injury/illness/exacerbation, and 1 comorbidity: MS  are also affecting patient's functional outcome.   REHAB POTENTIAL: Good  CLINICAL DECISION MAKING: Evolving/moderate complexity  EVALUATION COMPLEXITY: Moderate   GOALS: Goals reviewed with patient? Yes  SHORT TERM GOALS: Target date: 08/08/2023   Pt will be ind with initial HEP Baseline: Goal status: MET  08/07/23  2.  Pt will have improved lumbar ROM by at least 10% with no pain in all directions Baseline:  Goal status: MET 08/07/23 see objective  3.  Pt will be able to reach behind his head/down his back without L shoulder pain  Baseline:  Goal status: MET 08/21/23  4.  Pt will report decrease in overall pain by at least 25% with activities Baseline:  Goal status: MET 08/07/23 back   LONG TERM GOALS: Target date: 09/05/2023   Pt will be ind with management and progression of HEP to transition to community wellness/gym activities again Baseline:  Goal status: IN  PROGRESS 5/5  2.  Pt will demo at least 4/5 L posterior shoulder strength for improved UE movement/endurance for conducting Baseline:  Goal status: MET 5/5  3.  Pt will demo at least a 1/2 muscle grade increase with bilat LE strength to improve standing and walking tolerance Baseline:  Goal status: IN PROGRESS 5/5  4.  Pt will be able to lift and carry at least 20# with manageable back pain for home and community tasks Baseline:  Goal status: IN PROGRESS 5/5  5.  Pt will have improved Modified Oswestry to </=30% to demo MCID Baseline:  Goal status: IN PROGRESS 5/5   PLAN:  PT FREQUENCY: 2x/week  PT DURATION: 8 weeks  PLANNED INTERVENTIONS: 97164- PT Re-evaluation, 97110-Therapeutic exercises, 97530- Therapeutic activity, 97112- Neuromuscular re-education, 97535- Self Care, 82956- Manual therapy, [id number]- Gait training, [phone]- Aquatic Therapy, [phone]- Electrical stimulation (unattended), [id number]- Ultrasound, [id number]- Traction (mechanical), [id number]- Ionotophoresis 4mg /ml Dexamethasone , Patient/Family education, Balance training, Stair training, Taping, Dry Needling, Joint mobilization, Spinal mobilization, Cryotherapy, and Moist heat.  PLAN FOR NEXT SESSION: COMPLETE MOD OSWESTRY, Continue to address forward head while pt performs his aquatic exercises.   Bethanne Brooks, PTA 08/23/23 9:42 AM    [facility] [address] [place], [place] [address] Phone # [phone] Fax [phone]

## 2023-08-27 ENCOUNTER — Other Ambulatory Visit: Payer: Self-pay | Admitting: Neurology

## 2023-08-28 ENCOUNTER — Encounter (HOSPITAL_COMMUNITY): Payer: Self-pay

## 2023-08-28 NOTE — Telephone Encounter (Signed)
 I didn't see continue gabapentin  on last note: 1.  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   MRI shows 2-3 spinal plaques - one to the left in the corticospinal tract.    2.   Continue methylphenidate  and armodafinil   3.   Continue seeing pain management for lumbar injections as needed.   If worsens return to [facility].  Low dose hydrocodoe as needed 4.   Stay active and exercise as tolerated 5.    Return to see me in 6 months or sooner if there are new or worsening neurologic symptoms.

## 2023-08-29 NOTE — Therapy (Signed)
 OUTPATIENT PHYSICAL THERAPY THORACOLUMBAR TREATMENT NOTE  Patient Name: Cameron [name] MRN: [id number] DOB:03/19/1956, 68 y.o., male Today's Date: 08/30/2023  END OF SESSION:  PT End of Session - 08/30/23 0928     Visit Number 13    Number of Visits 16    Date for PT Re-Evaluation 09/05/23    Authorization Type [employer]    Authorization Time Period approved 16 visits 3/25 to 5/20    Authorization - Visit Number 13    PT Start Time 0928    PT Stop Time 1015    PT Time Calculation (min) 47 min    Activity Tolerance Patient tolerated treatment well    Behavior During Therapy [facility] for tasks assessed/performed                     Past Medical History:  Diagnosis Date   Abnormal MRI 2007   nonspecific white matter abnormalities   Allergy    seasonal   Anxiety    no meds   Arthritis    spine   Cataract    GERD (gastroesophageal reflux disease)    Helicobacter pylori gastritis 07/20/2023        History of kidney stones    x 2 episodes- last 2020   Hx of adenomatous colonic polyps 09/02/2021   2 diminutive adenomas recall 2030   Idiopathic scoliosis    Migraines    OCULAR connected to MS   Multiple sclerosis (HCC)    Neuromuscular disorder (HCC)    MS - mild left side spasicity   Vertigo    1 episode.  approx 12/2021.  Lasted 2-3 days.   Past Surgical History:  Procedure Laterality Date   CATARACT EXTRACTION W/PHACO Right 06/28/2023   Procedure: CATARACT EXTRACTION PHACO AND INTRAOCULAR LENS PLACEMENT (IOC) RIGHT  6.17  00:42.8;  Surgeon: Annell Kidney, MD;  Location: [facility];  Service: Ophthalmology;  Laterality: Right;   COLONOSCOPY  06/27/2007   Normal   CYSTOSCOPY/URETEROSCOPY/HOLMIUM LASER/STENT PLACEMENT Right 07/27/2018   Procedure: CYSTOSCOPY/URETEROSCOPY/HOLMIUM LASER/STENT PLACEMENT;  Surgeon: Geraline Knapp, MD;  Location: [facility];  Service: Urology;  Laterality: Right;   LUMBAR DISC SURGERY     URETEROLITHOTOMY   11/16/2001   US  ECHOCARDIOGRAPHY  11/16/2004   normal, Carotids normal   Patient Active Problem List   Diagnosis Date Noted   Tendonitis of shoulder 08/10/2023   Anterior shin splints 08/10/2023   Helicobacter pylori gastritis 07/20/2023   Early satiety 06/06/2023   Prostate hypertrophy 07/12/2022   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   Hx of adenomatous colonic polyps 09/02/2021   Right optic neuritis 02/04/2021   Lumbar radiculopathy 06/25/2020   Other fatigue 06/25/2020   Lumbar disc herniation 05/01/2020   High risk medication use 11/04/2019   Vitamin D  deficiency 11/04/2019   Left foot drop 11/04/2019   Vision disturbance 11/04/2019   Abnormal MRI of head 09/24/2019   Ureteral calculus 07/25/2018   Hydronephrosis with urinary obstruction due to ureteral calculus 07/25/2018   Sensorineural hearing loss (SNHL), bilateral 03/06/2017   Low back pain 05/29/2014   Routine general medical examination at a health care facility 02/23/2012   GERD (gastroesophageal reflux disease)    Hyperlipemia 10/03/2007   Seasonal allergic rhinitis due to pollen 07/19/2006   RENAL CALCULUS 07/19/2006   Multiple sclerosis (HCC) 10/16/2005    PCP: Helaine Llanos, MD  REFERRING PROVIDER: Assunta Lax, PA-C  REFERRING DIAG: [id number] (ICD-10-CM) - Spondylosis without myelopathy or  radiculopathy, lumbar region  Rationale for Evaluation and Treatment: Rehabilitation  THERAPY DIAG:  Other low back pain  Muscle weakness (generalized)  Cramp and spasm  Abnormal posture  Chronic left shoulder pain  ONSET DATE: Chronic; at least 2022  SUBJECTIVE:                                                                                                                                                                                           SUBJECTIVE STATEMENT: At fingertips. At end of day it creeps into the hand and my neck gets stiff. I feel like I'm better,  but I don't trust that it won't come back.   PERTINENT HISTORY:  L2-L3 ablation, discectomy, MS  PAIN:  07/18/23 Are you having pain? Left shoulder is 3-4/10 in left upper traps area.      PRECAUTIONS: None  RED FLAGS: None   WEIGHT BEARING RESTRICTIONS: No  FALLS:  Has patient fallen in last 6 months? No  LIVING ENVIRONMENT: Lives with: lives with their family, husband Lives in: House/apartment Stairs: No Has following equipment at home: None  OCCUPATION: Programmer, multimedia  PLOF: Independent  PATIENT GOALS: "I would love to be able to do a moderate workout every day and stretching" (Not sure if the stretching he's doing is helping or hurting). "I want to be more active and feel secure with activity" Wants to learn more about energy conservation for his MS, decrease pain levels in general  NEXT MD VISIT: n/a  OBJECTIVE:  Note: Objective measures were completed at Evaluation unless otherwise noted.  DIAGNOSTIC FINDINGS:  11/29/22 Brain MRI IMPRESSION: This MRI of the brain with and without contrast shows the following: Multiple T2/FLAIR hyperintense foci in the cerebral hemispheres in a pattern consistent with chronic demyelinating plaque associated with multiple sclerosis.  None of the foci enhanced or appear to be acute.  Compared to the MRI from 12/28/2021, there were no new lesions. No acute findings.  Normal enhancement pattern.  PATIENT SURVEYS:  Modified Oswestry Low Back Pain Disability Questionnaire: 20 / 50 = 40.0 % 08/30/23 Modified Oswestry: 14 / 50 = 28.0 %  COGNITION: Overall cognitive status: Within functional limits for tasks assessed     SENSATION: L finger N/T; "drop foot" on L with neuropathy  MUSCLE LENGTH: Did not assess on eval Figure 4 stretch is tighter on R hip in seated vs L hip  POSTURE: rounded shoulders and forward head  PALPATION: TTP L>R with trigger points: UT, mid/low trap, infraspinatus, lats/teres minor and major  TTP L>R glute med and  hamstrings with trigger points, R>L QL with trigger points. Did not seem particularly  tender with SIJ PAs  LUMBAR ROM:   AROM eval 08/07/23  Flexion 75% 90%  Extension 25% (felt good) full  Right lateral flexion 50%* WNL  Left lateral flexion 30% WNL tight on R side  Right rotation 75% WNL  Left rotation 75% WNL   (Blank rows = not tested, * = pain)  UPPER EXTREMITY MMT: most painful when asked to place his hands behind his head and reach behind him to check functional external rotation ROM  MMT Right eval Left eval Left  08/21/23 Right 5/5  Shoulder flexion 5 5 5    Shoulder extension (prone) 5 4 4+standing   Shoulder abduction 5 5 4+   Shoulder adduction      Shoulder internal rotation 5 5 5    Shoulder external rotation (sitting) 5 3+ 5    Middle trap (prone) 5 4- 5 5  Low trap (prone) 4 3- 4+ 4+  Elbow flexion 5 5    Elbow extension 5 5     (Blank rows = not tested)   LOWER EXTREMITY MMT:    MMT Right eval Left eval Right 08/21/23 Left  08/21/23 Left 08/30/23  Hip flexion 4 3+ 5 4+   Hip extension 4 3+ 5 5   Hip abduction 4 3+ 5 5   Hip adduction       Hip internal rotation       Hip external rotation       Knee flexion 4 4- 5 4- 4+  Knee extension 5 5 5 5    Ankle dorsiflexion 5 4 5 5    Ankle plantarflexion       Ankle inversion       Ankle eversion        (Blank rows = not tested)  LUMBAR SPECIAL TESTS:  Did not assess  FUNCTIONAL TESTS:  Plank for core strength TBA  GAIT: Distance walked: Into clinic Assistive device utilized: None Level of assistance: Complete Independence  08/21/23 CERVICAL SPECIAL TESTS:  Upper limb tension test (ULTT): negative but does get some forearm tightness after testing, Spurling's test: Negative, and Distraction test: causes increased pain under arm pit and into foream   TREATMENT DATE:  08/30/23 Nustep x 8 min level 6 and discussed post PT plan to exercise Mod oswestry and goals assessed Prone left shoulder extension  with 2 lbs 2 x 10 (numbness increases) Prone left shoulder row with 2 lb 2 x 10 Prone left shoulder horizontal abduction with 2 lb 2 x 10   Trigger Point Dry Needling  Subsequent Treatment: Instructions provided previously at initial dry needling treatment.   Patient Verbal Consent Given: Yes Education Handout Provided: Previously Provided Muscles Treated: L IS, UT, LS and multifidi Electrical Stimulation Performed: No Treatment Response/Outcome: Utilized skilled palpation to identify bony landmarks and trigger points.  Able to illicit twitch response and muscle elongation.  Soft tissue mobilization to muscles needled following DN to further promote tissue elongation and decreased pain.        08/23/23:Pt arrives for aquatic physical therapy. Treatment took place in 3.5-5.5 feet of water. Water temperature was 91 degrees F. Pt entered the pool via independently with mild use of rails. Pt requires buoyancy of water for support and to offload joints with strengthening exercises.  Pt utilizes viscosity of the water required for strengthening. Seated water bench with 75% submersion Pt performed seated LE AROM exercises 20x in all planes, concurrent discussion on pt's current status and goals. 75% depth water walking 10x in each  direction with UE push/pull using hand floats. Heel lifts 15x. Hip 3 way kicks with more focus o the single leg balance today 20x each Bi no UE support. . High knee march across pool2 slow lengths while pushing hand floast down by his side. VC for posture. Standing arm press with single buoy hand floats 2x10. Standing posture with VC for cervical retraction, hold 3-5 sec 5x. Seated decompression with large noodle behind 2x for short break, especially when LE got unsteady from fatigue. Horseback underwater bicycle 7 min.    08/21/23: Nustep x 6 min level 6 Wrist flexor stretch 2x30 sec Goals and neck assessed- see objective Pain with lateral SB R Manual: Trigger Point Dry  Needling Subsequent Treatment: Instructions provided previously at initial dry needling treatment.   Patient Verbal Consent Given: Yes Education Handout Provided: Previously Provided Muscles Treated: L rhomboids, UT, LS and multifidi Electrical Stimulation Performed: No Treatment Response/Outcome: Utilized skilled palpation to identify bony landmarks and trigger points.  Able to illicit twitch response and muscle elongation.  Soft tissue mobilization to muscles needled following DN to further promote tissue elongation and decreased pain.     08/16/23:Pt arrives for aquatic physical therapy. Treatment took place in 3.5-5.5 feet of water. Water temperature was 91 degrees F. Pt entered the pool via independently with mild use of rails. Pt requires buoyancy of water for support and to offload joints with strengthening exercises.  Pt utilizes viscosity of the water required for strengthening. Seated water bench with 75% submersion Pt performed seated LE AROM exercises 20x in all planes, concurrent discussion on pt's current status and goals. 75% depth water walking 10x in each direction with natural arm swing. Heel lifts 15x. Hip 3 way kicks with more focus o the single leg balance today 20x each Bil. High knee march across pool 4 lengths holding hand floats to assist balance. Attempted arm press with same hand floats but this seemed to aggrevate the elbow, stopped exercise. Standing posture with VC for cervical retraction, hold 3-5 sec 5x. Seated decompression with large noodle behind.Horseback underwater bicycle 7 min.    PATIENT EDUCATION:  Education details:ongoing PT POC  Person educated: Patient Education method: Explanation, Demonstration, and Handouts Education comprehension: verbalized understanding, returned demonstration, and needs further education  HOME EXERCISE PROGRAM: Access Code: [email or web address] URL: [email or web address] Date: 07/18/2023 Prepared by: Aletha Anderson  Exercises - Child's Pose with Sidebending  - 1 x daily - 7 x weekly - 2 sets - 30 sec hold - Seated Piriformis Stretch  - 1 x daily - 7 x weekly - 2 sets - 30 sec hold - Seated Figure 4 Piriformis Stretch  - 1 x daily - 7 x weekly - 2 sets - 30 sec hold - Supine Figure 4  - 1 x daily - 7 x weekly - 2 sets - 30 sec hold - Supine Piriformis Stretch with Foot on Ground  - 1 x daily - 7 x weekly - 2 sets - 30 sec hold - Standing Hamstring Stretch on Chair  - 1 x daily - 7 x weekly - 1 sets - 3 reps - 30 sec hold - Quadricep Stretch with Chair and Counter Support  - 1 x daily - 7 x weekly - 1 sets - 3 reps - 30 sec hold - Supine Posterior Pelvic Tilt  - 1 x daily - 7 x weekly - 1 sets - 20 reps - Supine 90/90 Alternating Heel Touches with Posterior Pelvic Tilt  - 1 x daily -  7 x weekly - 1 sets - 20 reps - Dead Bug  - 1 x daily - 7 x weekly - 1 sets - 20 reps  ASSESSMENT:  CLINICAL IMPRESSION: Patient reports decreased L UE symptoms since last visit. Overall he has made improvements in L shoulder pain. He continues to demonstrate postural deficits, but is aware of his posture and is independent in his HEP to continue strengthening. He reports no low back pain. He has met all of his LTGs and is ready for discharge.   OBJECTIVE IMPAIRMENTS: decreased activity tolerance, decreased coordination, decreased endurance, decreased mobility, difficulty walking, decreased ROM, decreased strength, increased fascial restrictions, increased muscle spasms, impaired flexibility, impaired sensation, impaired tone, impaired UE functional use, improper body mechanics, postural dysfunction, and pain.   ACTIVITY LIMITATIONS: lifting, bending, standing, squatting, sleeping, reach over head, and locomotion level  PARTICIPATION LIMITATIONS: meal prep, cleaning, laundry, shopping, community activity, occupation, and yard work  PERSONAL FACTORS: Age, Fitness, Past/current experiences, Profession, Time since onset  of injury/illness/exacerbation, and 1 comorbidity: MS are also affecting patient's functional outcome.   REHAB POTENTIAL: Good  CLINICAL DECISION MAKING: Evolving/moderate complexity  EVALUATION COMPLEXITY: Moderate   GOALS: Goals reviewed with patient? Yes  SHORT TERM GOALS: Target date: 08/08/2023   Pt will be ind with initial HEP Baseline: Goal status: MET  08/07/23  2.  Pt will have improved lumbar ROM by at least 10% with no pain in all directions Baseline:  Goal status: MET 08/07/23 see objective  3.  Pt will be able to reach behind his head/down his back without L shoulder pain  Baseline:  Goal status: MET 08/21/23  4.  Pt will report decrease in overall pain by at least 25% with activities Baseline:  Goal status: MET 08/07/23 back   LONG TERM GOALS: Target date: 09/05/2023   Pt will be ind with management and progression of HEP to transition to community wellness/gym activities again Baseline:  Goal status: MET 08/30/23  2.  Pt will demo at least 4/5 L posterior shoulder strength for improved UE movement/endurance for conducting Baseline:  Goal status: MET 5/5  3.  Pt will demo at least a 1/2 muscle grade increase with bilat LE strength to improve standing and walking tolerance Baseline:  Goal status: MET  08/30/23  4.  Pt will be able to lift and carry at least 20# with manageable back pain for home and community tasks Baseline:  Goal status: MET 08/30/23  - able to pull sod and carry pots.  5.  Pt will have improved Modified Oswestry to </=30% to demo MCID Baseline:  Goal status: MET 08/30/23   PLAN:  PT FREQUENCY: 2x/week  PT DURATION: 8 weeks  PLANNED INTERVENTIONS: 97164- PT Re-evaluation, 97110-Therapeutic exercises, 97530- Therapeutic activity, 97112- Neuromuscular re-education, 97535- Self Care, 62952- Manual therapy, [phone]- Gait training, [phone]- Aquatic Therapy, [phone]- Electrical stimulation (unattended), [id number]- Ultrasound, 66440- Traction  (mechanical), [id number]- Ionotophoresis 4mg /ml Dexamethasone , Patient/Family education, Balance training, Stair training, Taping, Dry Needling, Joint mobilization, Spinal mobilization, Cryotherapy, and Moist heat.  PLAN FOR NEXT SESSION: D/C to HEP, Pool: Continue to address forward head while pt performs his aquatic exercises.   Jinx Mourning, PT  08/30/23 5:54 PM    [facility] [address] [place], [place] [address] Phone # [phone] Fax [phone]

## 2023-08-30 ENCOUNTER — Ambulatory Visit: Admitting: Physical Therapy

## 2023-08-30 ENCOUNTER — Encounter: Payer: Self-pay | Admitting: Physical Therapy

## 2023-08-30 ENCOUNTER — Telehealth: Payer: Self-pay | Admitting: Internal Medicine

## 2023-08-30 DIAGNOSIS — R293 Abnormal posture: Secondary | ICD-10-CM

## 2023-08-30 DIAGNOSIS — M6281 Muscle weakness (generalized): Secondary | ICD-10-CM

## 2023-08-30 DIAGNOSIS — R252 Cramp and spasm: Secondary | ICD-10-CM

## 2023-08-30 DIAGNOSIS — M25512 Pain in left shoulder: Secondary | ICD-10-CM | POA: Diagnosis not present

## 2023-08-30 DIAGNOSIS — G8929 Other chronic pain: Secondary | ICD-10-CM | POA: Diagnosis not present

## 2023-08-30 DIAGNOSIS — M25552 Pain in left hip: Secondary | ICD-10-CM | POA: Diagnosis not present

## 2023-08-30 DIAGNOSIS — M5459 Other low back pain: Secondary | ICD-10-CM | POA: Diagnosis not present

## 2023-08-30 DIAGNOSIS — R2689 Other abnormalities of gait and mobility: Secondary | ICD-10-CM | POA: Diagnosis not present

## 2023-08-30 NOTE — Telephone Encounter (Signed)
 Copied from CRM [id number]. Topic: General - Other >> Aug 30, 2023 12:47 PM Rosamond Comes wrote: Reason for CRM: patient calling in. Stating he has a telephone appt with [facility] that he did not set up. Appt is scheduled for 08/30/23 at 1:30pm  [facility] is going over his health information. Patient would like to know why [facility] has his information and why they are asking this information, how did they get his information.   Patient would like a call back [phone]

## 2023-09-01 ENCOUNTER — Encounter: Payer: Self-pay | Admitting: Internal Medicine

## 2023-09-04 ENCOUNTER — Ambulatory Visit: Payer: Medicare PPO | Admitting: Internal Medicine

## 2023-09-04 ENCOUNTER — Encounter: Payer: Self-pay | Admitting: Internal Medicine

## 2023-09-04 ENCOUNTER — Other Ambulatory Visit: Payer: Self-pay | Admitting: Neurology

## 2023-09-04 VITALS — BP 116/76 | HR 74 | Temp 97.9°F | Ht 69.75 in | Wt 175.0 lb

## 2023-09-04 DIAGNOSIS — Z23 Encounter for immunization: Secondary | ICD-10-CM

## 2023-09-04 DIAGNOSIS — Z0000 Encounter for general adult medical examination without abnormal findings: Secondary | ICD-10-CM

## 2023-09-04 DIAGNOSIS — F988 Other specified behavioral and emotional disorders with onset usually occurring in childhood and adolescence: Secondary | ICD-10-CM

## 2023-09-04 DIAGNOSIS — G35 Multiple sclerosis: Secondary | ICD-10-CM | POA: Diagnosis not present

## 2023-09-04 DIAGNOSIS — K219 Gastro-esophageal reflux disease without esophagitis: Secondary | ICD-10-CM | POA: Diagnosis not present

## 2023-09-04 MED ORDER — ARMODAFINIL 200 MG PO TABS: 1.0000 | ORAL_TABLET | Freq: Every morning | ORAL | 5 refills | Status: DC

## 2023-09-04 NOTE — Progress Notes (Signed)
 Subjective:    Patient ID: Cameron [name], male    DOB: 22-Jan-1956, 68 y.o.   MRN: [id number]  HPI Here for Welcome to Medicare wellness visit and follow up of chronic health conditions Reviewed advanced directives Reviewed other doctors--Dr Crisoforo Dolores, Dre Brasington--ophthal, Dr Sater-neurology, Dr Belen Bowers Right cataract done in March Had spinal ablation done in February also---which really helped back-- Dr Faye Hoops No hospitalizations Vision is great Hearing is good No falls Does have intermittent down times--still adjusting to being retired. Not anhedonic Very rare alcohol---no tobacco Is starting to get back to exercise--since going through physical therapy Independent with instrumental ADLs No marked memory issues  Went through 6 weeks of PT and also aquatic therapy The pool therapy really helped Did injury left shoulder doing some slow exercises with left arm--not back to normal Now has numbness in tips of left fingers and has pain from left trapezius and into left arm/wrist Ice, voltaren, lidocaine ---mostly at his neck/trap junction Still affects his sleep PT has tried dry needling--at most very brief help Lack of sleep is affecting him overall--like some brain fog, etc--related to his MS Plans to speak to Dr Godwin Lat about this  MS is under control--now considered secondary progressive On the leflunomide , armodafanil (to help him make it through the day) Has prn hydrocodone  for occ more severe pain  On ritalin  20 bid Helps him focus (which is down from the MS)  Recent treatment for H pylori Eating better now--satiety is normal again  Current Outpatient Medications on File Prior to Visit  Medication Sig Dispense Refill   ALPRAZolam  (XANAX ) 0.5 MG tablet Take 2 tablets thirty minutes prior to MRI.  May take one additional tablet before entering scanner, if needed.  MUST HAVE DRIVER. 3 tablet 0   Armodafinil  200 MG TABS Take 1 tablet (200 mg total) by mouth in  the morning. 30 tablet 5   Cholecalciferol (DIALYVITE VITAMIN D  5000 PO) Take by mouth daily.     cyclobenzaprine  (FLEXERIL ) 10 MG tablet Take 1 tablet (10 mg total) by mouth at bedtime. 90 tablet 3   fluticasone  (FLONASE ) 50 MCG/ACT nasal spray Place 2 sprays into both nostrils daily. In each nostril 16 g 12   gabapentin  (NEURONTIN ) 100 MG capsule TAKE 1 CAPSULE BY MOUTH EVERY MORNING AND TAKE TWO CAPSULES BY MOUTH EVERY NIGHT AT BEDTIME 90 capsule 5   gabapentin  (NEURONTIN ) 300 MG capsule One po tid 90 capsule 5   HYDROcodone -acetaminophen  (NORCO) 7.5-325 MG tablet Take 0.5-1 tablets by mouth 2 (two) times daily as needed for moderate pain (pain score 4-6). 60 tablet 0   hydrocortisone  (ANUCORT-HC ) 25 MG suppository UNWRAP AND INSERT 1 SUPPOSITORY INTO THE RECTUM THREE TIMES A DAY AS NEEDED 15 suppository 11   ibuprofen  (ADVIL ,MOTRIN ) 200 MG tablet Take 200-400 mg by mouth every 6 (six) hours as needed.     leflunomide  (ARAVA ) 20 MG tablet TAKE 1 TABLET BY MOUTH DAILY 90 tablet 1   methylphenidate  (RITALIN ) 20 MG tablet Take one po AM and one po at noon 60 tablet 0   omeprazole (PRILOSEC) 20 MG capsule Take 20 mg by mouth daily.     No current facility-administered medications on file prior to visit.    Allergies  Allergen Reactions   Citalopram Hydrobromide Itching and Nausea Only   Penicillins Other (See Comments)    REACTION: questionable, Childhood reaction    Past Medical History:  Diagnosis Date   Abnormal MRI 2007   nonspecific white matter abnormalities  Allergy    seasonal   Anxiety    no meds   Arthritis    spine   Cataract    GERD (gastroesophageal reflux disease)    Helicobacter pylori gastritis 07/20/2023        History of kidney stones    x 2 episodes- last 2020   Hx of adenomatous colonic polyps 09/02/2021   2 diminutive adenomas recall 2030   Idiopathic scoliosis    Migraines    OCULAR connected to MS   Multiple sclerosis (HCC)    Neuromuscular  disorder (HCC)    MS - mild left side spasicity   Vertigo    1 episode.  approx 12/2021.  Lasted 2-3 days.    Past Surgical History:  Procedure Laterality Date   CATARACT EXTRACTION W/PHACO Right 06/28/2023   Procedure: CATARACT EXTRACTION PHACO AND INTRAOCULAR LENS PLACEMENT (IOC) RIGHT  6.17  00:42.8;  Surgeon: Annell Kidney, MD;  Location: [facility];  Service: Ophthalmology;  Laterality: Right;   COLONOSCOPY  06/27/2007   Normal   CYSTOSCOPY/URETEROSCOPY/HOLMIUM LASER/STENT PLACEMENT Right 07/27/2018   Procedure: CYSTOSCOPY/URETEROSCOPY/HOLMIUM LASER/STENT PLACEMENT;  Surgeon: Geraline Knapp, MD;  Location: [facility];  Service: Urology;  Laterality: Right;   LUMBAR DISC SURGERY     URETEROLITHOTOMY  11/16/2001   US  ECHOCARDIOGRAPHY  11/16/2004   normal, Carotids normal    Family History  Problem Relation Age of Onset   Diabetes Father    Heart disease Father    Dementia Father    Lumbar disc disease Brother    Dementia Paternal Grandmother    CAD Neg Hx    Hypertension Neg Hx    Cancer Neg Hx    Colon cancer Neg Hx    Colon polyps Neg Hx    Esophageal cancer Neg Hx    Stomach cancer Neg Hx    Rectal cancer Neg Hx     Social History   Socioeconomic History   Marital status: Married    Spouse name: Not on file   Number of children: Not on file   Years of education: Not on file   Highest education level: Doctorate  Occupational History   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 Sister is alternate   Would accept resuscitation   No feeding tube if cognitively unaware   Social Drivers of Health   Financial Resource Strain: Low Risk  (09/01/2023)   Overall Financial Resource Strain (CARDIA)    Difficulty of Paying Living Expenses: Not hard at all  Food Insecurity: No Food Insecurity (09/01/2023)   Hunger Vital Sign    Worried About Running Out of Food in the Last Year: Never true    Ran Out of Food in the Last Year: Never true  Transportation Needs: No  Transportation Needs (09/01/2023)   PRAPARE - Administrator, Civil Service (Medical): No    Lack of Transportation (Non-Medical): No  Physical Activity: Insufficiently Active (09/01/2023)   Exercise Vital Sign    Days of Exercise per Week: 2 days    Minutes of Exercise per Session: 40 min  Stress: No Stress Concern Present (09/01/2023)   [employer] of Occupational Health - Occupational Stress Questionnaire    Feeling of Stress : Not at all  Recent Concern: Stress - Stress Concern Present (08/09/2023)   [employer] of Occupational Health - Occupational Stress Questionnaire    Feeling of Stress : To some extent  Social Connections: Socially Integrated (09/01/2023)   Social Connection and Isolation Panel [NHANES]    Frequency of Communication with Friends and Family: Three times a week    Frequency of Social Gatherings with Friends and Family: Once a week    Attends Religious Services: More than 4 times per year    Active Member of [employer] or Organizations: Yes    Attends Engineer, structural: More than 4 times per year    Marital Status: Married  Catering manager Violence: Not At Risk (09/01/2023)   Humiliation, Afraid, Rape, and Kick questionnaire    Fear of Current or Ex-Partner: No    Emotionally Abused: No    Physically Abused: No    Sexually Abused:  No   Review of Systems Appetite is okay Weight stable Some sleep issues Wears seat belt Teeth are okay---keeps up with dentist No suspicious skin lesions--plans to establish with derm No chest pain or SOB No dizziness or syncope Occasional palpitations---brief skips? No edema Bowels move okay--no blood Voids fine--some dribbling    Objective:   Physical Exam Constitutional:      Appearance: Normal appearance.  HENT:     Mouth/Throat:     Pharynx: No oropharyngeal exudate or posterior oropharyngeal erythema.  Eyes:     Conjunctiva/sclera: Conjunctivae normal.     Pupils: Pupils are equal, round, and reactive to light.  Cardiovascular:     Rate and Rhythm: Normal rate and regular rhythm.     Pulses: Normal pulses.     Heart sounds: No murmur heard.    No gallop.  Pulmonary:     Effort: Pulmonary effort is normal.     Breath sounds: Normal breath sounds. No wheezing or rales.  Abdominal:     Palpations: Abdomen is soft.     Tenderness: There is no abdominal tenderness.  Musculoskeletal:     Cervical back: Neck supple.     Right lower leg: No edema.     Left lower leg: No edema.  Lymphadenopathy:     Cervical: No cervical adenopathy.  Skin:    Findings: No lesion or rash.  Neurological:     General: No focal deficit present.     Mental Status: He is alert and oriented to person, place, and time.     Comments: Mini--cog---normal  Psychiatric:        Mood and Affect: Mood normal.        Behavior: Behavior normal.            Assessment & Plan:

## 2023-09-04 NOTE — Assessment & Plan Note (Signed)
 I have personally reviewed the Medicare Annual Wellness questionnaire and have noted 1. The patient's medical and social history 2. Their use of alcohol, tobacco or illicit drugs 3. Their current medications and supplements 4. The patient's functional ability including ADL's, fall risks, home safety risks and hearing or visual             impairment. 5. Diet and physical activities 6. Evidence for depression or mood disorders  The patients weight, height, BMI and visual acuity have been recorded in the chart I have made referrals, counseling and provided education to the patient based review of the above and I have provided the pt with a written personalized care plan for preventive services.  I have provided you with a copy of your personalized plan for preventive services. Please take the time to review along with your updated medication list.  Discussed exercise Colon due 2030 Should get PSA with next blood work with Dr Godwin Lat Prevnar 20 Flu in fall--doesn't get COVID

## 2023-09-04 NOTE — Telephone Encounter (Signed)
 Last seen on 05/24/23 Follow up scheduled on 12/12/23  Armodafinil  200 mg last filled on 08/01/23 #30 tablets  Patient should have refills for gabapentin  300 mg Rx was sent on 05/24/23 with 5 refills.  Rx pending to be signed for Armodafinil 

## 2023-09-04 NOTE — Assessment & Plan Note (Signed)
 Does well with the methylphenidate 

## 2023-09-04 NOTE — Assessment & Plan Note (Signed)
 Early satiety better since H pylori Rx On omeprazole

## 2023-09-04 NOTE — Addendum Note (Signed)
 Addended by: Franne Ivory on: 09/04/2023 09:25 AM   Modules accepted: Orders

## 2023-09-04 NOTE — Assessment & Plan Note (Signed)
 Mostly controlled with lefunomide/modafanil Follows with Dr Godwin Lat

## 2023-09-04 NOTE — Telephone Encounter (Signed)
 Pt is requesting a refill for Armodafinil  200 MG TABS, gabapentin  (NEURONTIN ) 300 MG capsule,.  Pharmacy: [facility] [id number]

## 2023-09-13 ENCOUNTER — Telehealth: Payer: Self-pay

## 2023-09-13 NOTE — Telephone Encounter (Signed)
 Diatherix kit left at the front desk on the 2 nd floor for pt pick up. The pt has been advised

## 2023-09-13 NOTE — Telephone Encounter (Signed)
-----   Message from Nurse Osceola Depaz P sent at 07/31/2023 12:23 PM EDT ----- Pt to get diatherix 4 weeks after treatment

## 2023-09-22 ENCOUNTER — Other Ambulatory Visit: Payer: Self-pay | Admitting: Neurology

## 2023-09-22 MED ORDER — METHYLPHENIDATE HCL 20 MG PO TABS: ORAL_TABLET | ORAL | 0 refills | Status: DC

## 2023-09-22 NOTE — Telephone Encounter (Signed)
 Requested Prescriptions   Pending Prescriptions Disp Refills   methylphenidate  (RITALIN ) 20 MG tablet 60 tablet 0    Sig: Take one po AM and one po at noon   Last seen 05/24/23 Next qppt 12/12/23  Dispenses   Dispensed Days Supply Quantity Provider Pharmacy  methylphenidate  20 mg tablet 08/21/2023 30 60 tablet Sater, Sherida Dimmer, MD [facility]...  methylphenidate  20 mg tablet 07/19/2023 30 60 tablet Sater, Sherida Dimmer, MD [facility]...  methylphenidate  20 mg tablet 06/19/2023 30 60 tablet Debbra Fairy, MD [facility]...  methylphenidate  20 mg tablet 05/18/2023 30 60 tablet Sater, Sherida Dimmer, MD [facility]...  methylphenidate  20 mg tablet 04/15/2023 30 60 tablet Sater, Sherida Dimmer, MD [facility]...  methylphenidate  20 mg tablet 03/18/2023 30 60 tablet Sater, Sherida Dimmer, MD [facility]...  methylphenidate  20 mg tablet 02/14/2023 30 60 tablet Sater, Sherida Dimmer, MD [facility]...  methylphenidate  20 mg tablet 01/16/2023 30 60 tablet Sater, Sherida Dimmer, MD [facility]...    Routing to on call provider

## 2023-09-22 NOTE — Telephone Encounter (Signed)
 Pt call to request Medication Refill methylphenidate  (RITALIN ) 20 MG tablet  Pt would like medication to be sent to :  [facility] [id number] [place], [place] - [address]

## 2023-09-26 ENCOUNTER — Other Ambulatory Visit: Payer: Self-pay | Admitting: Neurology

## 2023-09-26 DIAGNOSIS — G35 Multiple sclerosis: Secondary | ICD-10-CM

## 2023-09-26 NOTE — Telephone Encounter (Signed)
 Last seen on 05/24/23 Follow up scheduled on 12/12/23

## 2023-09-27 DIAGNOSIS — M5412 Radiculopathy, cervical region: Secondary | ICD-10-CM | POA: Diagnosis not present

## 2023-09-28 ENCOUNTER — Other Ambulatory Visit: Payer: Self-pay | Admitting: Neurology

## 2023-09-28 MED ORDER — ARMODAFINIL 200 MG PO TABS: 200.0000 mg | ORAL_TABLET | Freq: Every day | ORAL | 0 refills | Status: DC

## 2023-09-28 NOTE — Telephone Encounter (Signed)
 Pt called requesting a refill on his Armodafinil  200 MG TABS . Pt states that he is requesting it early due to him going out of town and will not be having enough to last him till he gets back.

## 2023-09-28 NOTE — Telephone Encounter (Signed)
 Dr.Sater pt is leaving out of town on 10/01/23, he will run out of armodanfinl 200 mg on 10/05/23, he is due to be back in town on 10/08/23, but depending on his sister recovery may stay the latest 10/11/23 (sister is having surgery)  Pt is asking for a early refill for armodafinil  so he won't be out of medication so he is requesting a early refill.   Rx pending to be signed for a 7 day supply and I attached a note on Rx that pt will be leaving out of town.

## 2023-09-28 NOTE — Telephone Encounter (Signed)
 Pt reports that the pharmacy is asking for an annotated Rx as to why the Rx is needed early.  Pt informed phone rep this is needed early because he is going out of town soon as to why this is being requested earlier than normal.

## 2023-09-28 NOTE — Telephone Encounter (Signed)
 Last seen on 05/24/23 Follow up scheduled on 12/12/23  Pt has refills on Rx was sent on 09/04/23 with 5 refill.  Pt said she will reach out to pharmacy to see if they can do insurance override to pick up Rx early.   Pt is leaving town on 10/01/23, will run out of medication on 10/05/23, will back back in town on 10/08/23.

## 2023-10-10 ENCOUNTER — Other Ambulatory Visit: Payer: Self-pay | Admitting: Neurology

## 2023-10-10 MED ORDER — HYDROCODONE-ACETAMINOPHEN 7.5-325 MG PO TABS: 0.5000 | ORAL_TABLET | Freq: Two times a day (BID) | ORAL | 0 refills | Status: DC | PRN

## 2023-10-10 NOTE — Telephone Encounter (Signed)
 I called pt and informed him that he should have refills on Ritalin  20 mg tablet last picked up on 09/22/22 and Armodafinil  200 mg has refill on it already for his monthly Rx. But would need to wait unil his 7 day Armodafinil  Rx is finish before monthly can be refilled.  So no refills needed on either Rx.  I explained I would route the refill for hydrocodone  7.5-325 mg tablet.  Dispensed Days Supply Quantity Provider Pharmacy  hydrocodone  7.5 mg-acetaminophen  325 mg tablet 07/13/2023 30 60 tablet Sater, Charlie LABOR, MD [facility].SABRA     Rx pending to be signed

## 2023-10-10 NOTE — Telephone Encounter (Signed)
 Pt called to request Medication Refill:   methylphenidate  (RITALIN ) 20 MG tablet   Armodafinil  200 MG TABS -Pt states he got 7 days worth of medication need the rest   HYDROcodone -acetaminophen  (NORCO) 7.5-325 MG tablet   Pt would like medication to be sent to :  [facility] [id number] [place], [place] - [address]

## 2023-10-12 DIAGNOSIS — M4802 Spinal stenosis, cervical region: Secondary | ICD-10-CM | POA: Diagnosis not present

## 2023-10-12 DIAGNOSIS — M47812 Spondylosis without myelopathy or radiculopathy, cervical region: Secondary | ICD-10-CM | POA: Diagnosis not present

## 2023-10-12 DIAGNOSIS — M5412 Radiculopathy, cervical region: Secondary | ICD-10-CM | POA: Diagnosis not present

## 2023-10-12 DIAGNOSIS — M5021 Other cervical disc displacement,  high cervical region: Secondary | ICD-10-CM | POA: Diagnosis not present

## 2023-10-12 DIAGNOSIS — M50223 Other cervical disc displacement at C6-C7 level: Secondary | ICD-10-CM | POA: Diagnosis not present

## 2023-10-18 ENCOUNTER — Encounter: Payer: Self-pay | Admitting: Neurology

## 2023-10-18 ENCOUNTER — Other Ambulatory Visit: Payer: Self-pay | Admitting: Neurology

## 2023-10-18 MED ORDER — METHYLPREDNISOLONE 4 MG PO TABS: ORAL_TABLET | ORAL | 0 refills | Status: AC

## 2023-10-26 ENCOUNTER — Other Ambulatory Visit: Payer: Self-pay | Admitting: Neurology

## 2023-10-26 MED ORDER — METHYLPHENIDATE HCL 20 MG PO TABS: ORAL_TABLET | ORAL | 0 refills | Status: DC

## 2023-10-26 NOTE — Telephone Encounter (Signed)
 Pt is needing a refill request for his methylphenidate  (RITALIN ) 20 MG tablet to be sent to the [facility] in [place].

## 2023-10-26 NOTE — Telephone Encounter (Signed)
 Last seen on 05/24/23 Follow up scheduled 12/12/23   Dispensed Days Supply Quantity Provider Pharmacy  methylphenidate  20 mg tablet 09/22/2023 30 60 tablet Onita Duos, MD [facility]...     Rx pending to be signed

## 2023-11-14 DIAGNOSIS — M5412 Radiculopathy, cervical region: Secondary | ICD-10-CM | POA: Diagnosis not present

## 2023-11-22 ENCOUNTER — Encounter: Payer: Self-pay | Admitting: Neurology

## 2023-11-22 ENCOUNTER — Other Ambulatory Visit: Payer: Self-pay | Admitting: Neurology

## 2023-11-22 DIAGNOSIS — M5412 Radiculopathy, cervical region: Secondary | ICD-10-CM

## 2023-11-23 ENCOUNTER — Telehealth: Payer: Self-pay | Admitting: Neurology

## 2023-11-23 NOTE — Telephone Encounter (Signed)
 Referral for neurosurgery fax to [facility]. Phone: [phone], Fax: [phone]

## 2023-11-27 ENCOUNTER — Other Ambulatory Visit: Payer: Self-pay | Admitting: Neurology

## 2023-11-27 MED ORDER — METHYLPHENIDATE HCL 20 MG PO TABS: ORAL_TABLET | ORAL | 0 refills | Status: DC

## 2023-11-27 NOTE — Telephone Encounter (Signed)
Pt is requesting a refill for methylphenidate (RITALIN) 20 MG tablet .  Pharmacy: [facility] [id number]

## 2023-11-27 NOTE — Telephone Encounter (Signed)
 Last seen on 05/24/23 Follow up scheduled on 12/12/23   Dispensed Days Supply Quantity Provider Pharmacy  methylphenidate  20 mg tablet 10/26/2023 30 60 tablet Sater, Charlie LABOR, MD [facility]...     Rx pending to be signed

## 2023-12-01 ENCOUNTER — Encounter: Payer: Self-pay | Admitting: Neurology

## 2023-12-01 ENCOUNTER — Encounter: Payer: Self-pay | Admitting: Internal Medicine

## 2023-12-01 DIAGNOSIS — Z6825 Body mass index (BMI) 25.0-25.9, adult: Secondary | ICD-10-CM | POA: Diagnosis not present

## 2023-12-01 DIAGNOSIS — G959 Disease of spinal cord, unspecified: Secondary | ICD-10-CM | POA: Diagnosis not present

## 2023-12-01 DIAGNOSIS — M5412 Radiculopathy, cervical region: Secondary | ICD-10-CM | POA: Diagnosis not present

## 2023-12-04 ENCOUNTER — Other Ambulatory Visit: Payer: Self-pay | Admitting: Neurology

## 2023-12-04 ENCOUNTER — Other Ambulatory Visit (HOSPITAL_COMMUNITY): Payer: Self-pay

## 2023-12-04 DIAGNOSIS — G959 Disease of spinal cord, unspecified: Secondary | ICD-10-CM

## 2023-12-04 MED ORDER — HYDROCODONE-ACETAMINOPHEN 7.5-325 MG PO TABS: 0.5000 | ORAL_TABLET | Freq: Four times a day (QID) | ORAL | 0 refills | Status: DC | PRN

## 2023-12-07 ENCOUNTER — Ambulatory Visit
Admission: RE | Admit: 2023-12-07 | Discharge: 2023-12-07 | Disposition: A | Discharge status: Home / Self Care | Source: Ambulatory Visit

## 2023-12-07 DIAGNOSIS — G959 Disease of spinal cord, unspecified: Secondary | ICD-10-CM | POA: Diagnosis not present

## 2023-12-07 DIAGNOSIS — M50323 Other cervical disc degeneration at C6-C7 level: Secondary | ICD-10-CM | POA: Diagnosis not present

## 2023-12-07 DIAGNOSIS — M47812 Spondylosis without myelopathy or radiculopathy, cervical region: Secondary | ICD-10-CM | POA: Diagnosis not present

## 2023-12-07 DIAGNOSIS — M4802 Spinal stenosis, cervical region: Secondary | ICD-10-CM | POA: Diagnosis not present

## 2023-12-07 DIAGNOSIS — M50322 Other cervical disc degeneration at C5-C6 level: Secondary | ICD-10-CM | POA: Diagnosis not present

## 2023-12-12 ENCOUNTER — Ambulatory Visit: Payer: Medicare PPO | Admitting: Neurology

## 2023-12-12 ENCOUNTER — Encounter: Payer: Self-pay | Admitting: Neurology

## 2023-12-12 VITALS — BP 117/80 | HR 92 | Ht 70.0 in | Wt 181.5 lb

## 2023-12-12 DIAGNOSIS — G35 Multiple sclerosis: Secondary | ICD-10-CM

## 2023-12-12 DIAGNOSIS — F988 Other specified behavioral and emotional disorders with onset usually occurring in childhood and adolescence: Secondary | ICD-10-CM | POA: Diagnosis not present

## 2023-12-12 DIAGNOSIS — Z79899 Other long term (current) drug therapy: Secondary | ICD-10-CM | POA: Diagnosis not present

## 2023-12-12 DIAGNOSIS — H469 Unspecified optic neuritis: Secondary | ICD-10-CM

## 2023-12-12 DIAGNOSIS — R5383 Other fatigue: Secondary | ICD-10-CM

## 2023-12-12 DIAGNOSIS — M47816 Spondylosis without myelopathy or radiculopathy, lumbar region: Secondary | ICD-10-CM | POA: Diagnosis not present

## 2023-12-12 DIAGNOSIS — M5412 Radiculopathy, cervical region: Secondary | ICD-10-CM | POA: Diagnosis not present

## 2023-12-12 NOTE — Progress Notes (Signed)
 [facility]  PATIENT: Cameron [name] DOB: Jun 15, 1955  REFERRING DOCTOR OR PCP: Charlie Denise MD SOURCE: Patient, notes from primary care, imaging and laboratory reports, MRI images personally reviewed.  _________________________________   HISTORICAL  CHIEF COMPLAINT:  Chief Complaint  Patient presents with   Follow-up    Pt in room 10.alone. Here for MS follow up on Leflunomide . Patient states MS is stable, schedule for triple fusion in neck. Patient reports fatigue,pt left drop is stable. Last eye exam was this year.    HISTORY OF PRESENT ILLNESS:  Cameron [name] is a 68 y.o. man with relapsing remitting MS diagnosed 09/2019 but likely present since 2007 when he had visual disturbances. Cameron [name]   Update  12/12/2023 He has more neck pain that will radiate down the left arm to the wrist and fingers.    Gabapentin  has helped.   MRI shows multilevel DJD and probable left C5 nerve root compx  Will be having C4C5C6 fusion (Dr. Exie)..  He has mild hand weakness.    Worse pain is in the shoulder.  Due to pain and other things, he is noting more depression.    In the past, he saw a counselor/psychologist but stopped during Covid-19.      LBP better since RFA at [facility].    He has had lumbar surgery (Ostergard).  He takes 1/2 hydrocodone  most nights.  He is on gabapentin  100-300-400.   Hydrocodone  tid (sometimes 1/2 ).    He stopped exercising and walking as much when pain worsened.     His MS has been stable.  He is on leflunomide .  This is more affordable with the teriflunomide and theoretically is equivalent.  He denies exacerbations. He has not had any relapses recently but has progression.   He generally tolerates it well.    He is on armodafinil  200 mg po  Gait is doing well.  He has no falls but feels balance is slightly worse and he uses the bannister now.  He has minimal left leg weakness and mild left leg spasticity with some effect on his gait.  No current visual  issues but last year he had some visual field changes.   He notes some urinary urgency but no incontinence.    For the most part, bladder is fine.   Some urinary hesitancy.  He has noted mild issues with cognition.  He finds he can only do one mental task at a time and he is more forgetful. He notes word finding issues.   He has EDS.  He snores just a little bit and no OSA signs.   He is noting more fatigue. Armodafinil  with Ritalin  helps some.   He takes Ritalin  q730AM and q1030am and armodafinil  q1030am    MS History:      He began to experience some neurologic symptoms in 2007.  That year, he had the onset of difficulties with eye movements.  Specifically, he was having difficulty tracking smoothly.   He also had some stumbling.  Over 2 monhs, his symptoms improved.   In 2013, he fell due to a left foot drop and continues to have mild left leg weakness. Since then he also has noted that he feels disoriented, has trouble coming up with the right words and veers to the left intermittently, especially in bright fluorescent lights as in some stores.  He also has had left hand numbness and reduced coordination for the last year.   He was diagnosed 11/05/2019 based on  his history of optic neuritis, relapses with left-sided symptoms and additional changes in brain MRI and the presence of 2 foci in the cervical spine  Imaging studies: MRIs of the brain from 10/11/2005 and 10/29/2012:  The MRI show multiple T2/FLAIR hyperintense foci, at least 20, with some in the periventricular white matter and some in the juxtacortical white matter.  There was U-fiber involvement of a couple foci in the posterior left frontal lobe.  Between 2007 and 2014, 2-3 more lesions developed (the new lesions were mostly deep white matter).  We do not have any images of the cervical or thoracic spine spine so the spinal cord could not be reviewed.  MRI of the brain 10/15/2019 showed scattered T2/FLAIR hyperintense foci, some in the  periventricular white matter radially oriented to the ventricles and a couple in the juxtacortical white matter..  Compared to the MRI from 2014, there were 3 or 4 new lesions.  None of the foci enhance.  MRI brain 04/29/2020 scattered T2/FLAIR hyperintense foci in the hemispheres in a pattern consistent with chronic demyelinating plaque associated with multiple sclerosis.  None of the foci appear to be acute.  They do not enhance.  Compared to the MRI dated 04/29/2020, there are no new lesions.  MRI of the cervical and thoracic spine 10/15/2019 showed 2 T2 hyperintense foci within the spinal cord, 1 laterally to the left at C4 and the other posteriorly to the left adjacent to C6-C7.  There are degenerative changes at C4-C5 and C5-C6 with potential for left C5 nerve root compression.  There is mild spinal stenosis.  The thoracic spine showed a normal spinal cord and minimal degenerative changes at a couple levels that did not lead to nerve root compression or spinal stenosis.  MRI of the lumbar spine 02/16/2023 showed multilevel degenerative changes.  There is lateral recess stenosis at L1-L2 that could affect the right L2 nerve root.  There is foraminal narrowing to the left that could affect the left L2 nerve root lateral recess stenosis to the left at L2-L3 that could affect the left L3 nerve root.  At L3-L4 there is lateral recess stenosis to the right causing right L4 nerve root compression.  At L4-L5, lateral recess stenosis could affect the L5 nerve roots.  At L5-S1, foraminal narrowing to the right could affect the right L5 nerve root MRI of the cervical spine 10/12/2023 showed T2 hyperintense foci to the left at C4 and posteriorly to the left adjacent to C6-C7.  At C4-C5, there is foraminal narrowing due to degenerative changes causing possible left L5 nerve root compression.  Degenerative changes also to the left at C5-C6 with foraminal narrowing but likely not nerve root compression.  MRI of the  lumbar spine shows a large herniated disc at L2-L3 towards the left compressing the left L3 nerve root.  Additionally there is multilevel degenerative change elsewhere but with less potential for nerve root compression.   MRI brain 12/28/2021 showed Multiple T2/FLAIR hyperintense foci in the hemispheres in a pattern consistent with chronic demyelinating plaque associated with multiple sclerosis.  None of the foci enhanced or appear to be acute.  Compared to the MRI from 02/09/2021, there were no new lesions.  MRI cervical spine 912/2023 showed  IMPRESSION: This MRI of the cervical spine with and without contrast shows the following: Two T2 hyperintense foci within the final cord, laterally to the left adjacent to C4 and posterolaterally to the left adjacent to C6-C7.  Both of these were present  on the 10/15/2019 MRI and all consistent with chronic demyelinating plaque associated with multiple sclerosis. At C4-C5, there are degenerative changes causing moderately severe left foraminal narrowing with potential for left C5 nerve root compression.  No spinal stenosis. At C5-C6, there are degenerative changes causing mild spinal stenosis and moderately severe left foraminal narrowing with potential for left C6 nerve root compression. Degenerative changes are essentially unchanged compared to the 2021 MRI. Normal enhancement pattern.  REVIEW OF SYSTEMS: Constitutional: No fevers, chills, sweats, or change in appetite Eyes: As above Ear, nose and throat: No hearing loss, ear pain, nasal congestion, sore throat Cardiovascular: No chest pain, palpitations Respiratory:  No shortness of breath at rest or with exertion.   No wheezes GastrointestinaI: No nausea, vomiting, diarrhea, abdominal pain, fecal incontinence Genitourinary:  No dysuria, urinary retention.  He has mild frequency and nocturia.  He has a history of kidney stones. Musculoskeletal:  No neck pain, back pain Integumentary: No rash, pruritus,  skin lesions Neurological: as above Psychiatric: No depression at this time.  No anxiety Endocrine: No palpitations, diaphoresis, change in appetite, change in weigh or increased thirst Hematologic/Lymphatic:  No anemia, purpura, petechiae. Allergic/Immunologic: No itchy/runny eyes, nasal congestion, recent allergic reactions, rashes  ALLERGIES: Allergies  Allergen Reactions   Citalopram Hydrobromide Itching and Nausea Only   Penicillins Other (See Comments)    REACTION: questionable, Childhood reaction    HOME MEDICATIONS:  Current Outpatient Medications:    ALPRAZolam  (XANAX ) 0.5 MG tablet, Take 2 tablets thirty minutes prior to MRI.  May take one additional tablet before entering scanner, if needed.  MUST HAVE DRIVER., Disp: 3 tablet, Rfl: 0   Armodafinil  200 MG TABS, Take 1 tablet (200 mg total) by mouth in the morning., Disp: 30 tablet, Rfl: 5   Cholecalciferol (DIALYVITE VITAMIN D  5000 PO), Take by mouth daily., Disp: , Rfl:    cyclobenzaprine  (FLEXERIL ) 10 MG tablet, Take 1 tablet (10 mg total) by mouth at bedtime., Disp: 90 tablet, Rfl: 3   fluticasone  (FLONASE ) 50 MCG/ACT nasal spray, Place 2 sprays into both nostrils daily. In each nostril, Disp: 16 g, Rfl: 12   gabapentin  (NEURONTIN ) 300 MG capsule, One po tid, Disp: 90 capsule, Rfl: 5   HYDROcodone -acetaminophen  (NORCO) 7.5-325 MG tablet, Take 0.5-1 tablets by mouth every 6 (six) hours as needed for moderate pain (pain score 4-6)., Disp: 120 tablet, Rfl: 0   hydrocortisone  (ANUCORT-HC ) 25 MG suppository, UNWRAP AND INSERT 1 SUPPOSITORY INTO THE RECTUM THREE TIMES A DAY AS NEEDED, Disp: 15 suppository, Rfl: 11   ibuprofen  (ADVIL ,MOTRIN ) 200 MG tablet, Take 200-400 mg by mouth every 6 (six) hours as needed., Disp: , Rfl:    leflunomide  (ARAVA ) 20 MG tablet, TAKE 1 TABLET BY MOUTH DAILY, Disp: 90 tablet, Rfl: 0   methylphenidate  (RITALIN ) 20 MG tablet, Take one po AM and one po at noon, Disp: 60 tablet, Rfl: 0   omeprazole  (PRILOSEC) 20 MG capsule, Take 20 mg by mouth daily., Disp: , Rfl:    gabapentin  (NEURONTIN ) 100 MG capsule, TAKE 1 CAPSULE BY MOUTH EVERY MORNING AND TAKE TWO CAPSULES BY MOUTH EVERY NIGHT AT BEDTIME (Patient not taking: Reported on 12/12/2023), Disp: 90 capsule, Rfl: 5   methylPREDNISolone  (MEDROL ) 4 MG tablet, Taper from 6 pills po for one day to 1 pill po the last day over 6 days (Patient not taking: Reported on 12/12/2023), Disp: 21 tablet, Rfl: 0  PAST MEDICAL HISTORY: Past Medical History:  Diagnosis Date   Abnormal MRI 2007  nonspecific white matter abnormalities   Allergy    seasonal   Anxiety    no meds   Arthritis    spine   Cataract    GERD (gastroesophageal reflux disease)    Helicobacter pylori gastritis 07/20/2023        History of kidney stones    x 2 episodes- last 2020   Hx of adenomatous colonic polyps 09/02/2021   2 diminutive adenomas recall 2030   Idiopathic scoliosis    Migraines    OCULAR connected to MS   Multiple sclerosis (HCC)    Neuromuscular disorder (HCC)    MS - mild left side spasicity   Vertigo    1 episode.  approx 12/2021.  Lasted 2-3 days.    PAST SURGICAL HISTORY: Past Surgical History:  Procedure Laterality Date   CATARACT EXTRACTION W/PHACO Right 06/28/2023   Procedure: CATARACT EXTRACTION PHACO AND INTRAOCULAR LENS PLACEMENT (IOC) RIGHT  6.17  00:42.8;  Surgeon: Mittie Gaskin, MD;  Location[facility];  Service: Ophthalmology;  Laterality: Right;   COLONOSCOPY  06/27/2007   Normal   CYSTOSCOPY/URETEROSCOPY/HOLMIUM LASER/STENT PLACEMENT Right 07/27/2018   Procedure: CYSTOSCOPY/URETEROSCOPY/HOLMIUM LASER/STENT PLACEMENT;  Surgeon: Twylla Glendia BROCKS, MD;  Location: [facility];  Service: Urology;  Laterality: Right;   LUMBAR DISC SURGERY     URETEROLITHOTOMY  11/16/2001   US  ECHOCARDIOGRAPHY  11/16/2004   normal, Carotids normal    FAMILY HISTORY: Family History  Problem Relation Age of Onset   Diabetes Father     Heart disease Father    Dementia Father    Lumbar disc disease Brother    Dementia Paternal Grandmother    CAD Neg Hx    Hypertension Neg Hx    Cancer Neg Hx    Colon cancer Neg Hx    Colon polyps Neg Hx    Esophageal cancer Neg Hx    Stomach cancer Neg Hx    Rectal cancer Neg Hx     SOCIAL HISTORY:  Social History   Socioeconomic History   Marital status: Married    Spouse name: Not on file   Number of children: Not on file   Years of education: Not on file   Highest education level: Doctorate  Occupational History   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 Sister is alternate   Would accept resuscitation   No feeding tube if cognitively unaware   Social Drivers of Health   Financial Resource Strain: Low Risk  (09/01/2023)   Overall Financial Resource Strain (CARDIA)    Difficulty of Paying Living Expenses: Not hard at all  Food Insecurity: No Food Insecurity (09/01/2023)   Hunger Vital Sign    Worried About Running Out of Food in the Last Year: Never true    Ran  Out of Food in the Last Year: Never true  Transportation Needs: No Transportation Needs (09/01/2023)   PRAPARE - Administrator, Civil Service (Medical): No    Lack of Transportation (Non-Medical): No  Physical Activity: Insufficiently Active (09/01/2023)   Exercise Vital Sign    Days of Exercise per Week:  2 days    Minutes of Exercise per Session: 40 min  Stress: No Stress Concern Present (09/01/2023)   [employer] of Occupational Health - Occupational Stress Questionnaire    Feeling of Stress : Not at all  Recent Concern: Stress - Stress Concern Present (08/09/2023)   [employer] of Occupational Health - Occupational Stress Questionnaire    Feeling of Stress : To some extent  Social Connections: Socially Integrated (09/01/2023)   Social Connection and Isolation Panel    Frequency of Communication with Friends and Family: Three times a week    Frequency of Social Gatherings with Friends and Family: Once a week    Attends Religious Services: More than 4 times per year    Active Member of [employer] or Organizations: Yes    Attends Engineer, structural: More than 4 times per year    Marital Status: Married  Catering manager Violence: Not At Risk (09/01/2023)   Humiliation, Afraid, Rape, and Kick questionnaire    Fear of Current or Ex-Partner: No    Emotionally Abused: No    Physically Abused: No    Sexually Abused: No     PHYSICAL EXAM  Vitals:   12/12/23 0813  BP: 117/80  Pulse: 92  SpO2: 99%  Weight: 181 lb 8 oz (82.3 kg)  Height: 5' 10 (1.778 m)    Body mass index is 26.04 kg/m.  No results found.    General: The patient is well-developed and well-nourished and in no acute distress  Neck:  The neck is nontender.  Skin: Extremities are without rash or  edema.  Neurologic Exam  Mental status: The patient is alert and oriented x 3 at the time of the examination. The patient has apparent normal recent and remote memory, with an apparently normal attention span and concentration ability.   Speech is normal.  Cranial nerves: Extraocular movements are full. Facial strength is fine. No dysarthria is noted.No obvious hearing deficits are noted.  Motor:  Muscle bulk is normal.   Tone is slightly increased in the left leg relative to the right. Strength is  5  / 5 in all 4 extremities except 4+/5 proximal left leg..  Strength was 5/5 in the left arm.  Rapid altering movements were very slightly reduced in the left hand  Sensory: Sensory testing is intact to soft touch and vibration sensation in all 4 extremities.  Coordination: Cerebellar testing reveals good finger-nose-finger and heel-to-shin bilaterally.  Gait and station: Station is normal.  He has a normal stride and a very slight left foot drop.  He can turn well.  His tandem gait is wide.  Romberg is negative.  Reflexes: Deep tendon reflexes are symmetric and normal in arms but increased at knees, left > right (with spread) and at the ankles (though no clonus)  DIAGNOSTIC DATA (LABS, IMAGING, TESTING) - I reviewed patient records, labs, notes, testing and imaging myself where available.  Lab Results  Component Value Date   WBC 4.2 05/24/2023   HGB 14.7 05/24/2023   HCT 42.2 05/24/2023   MCV 91 05/24/2023   PLT 214 05/24/2023      Component Value Date/Time  NA 137 05/24/2023 1012   K 4.6 05/24/2023 1012   CL 101 05/24/2023 1012   CO2 20 05/24/2023 1012   GLUCOSE 85 05/24/2023 1012   GLUCOSE 76 07/07/2021 0929   BUN 11 05/24/2023 1012   CREATININE 0.90 05/24/2023 1012   CREATININE 1.13 11/21/2016 1224   CALCIUM 9.1 05/24/2023 1012   PROT 7.2 05/24/2023 1012   ALBUMIN 4.6 05/24/2023 1012   AST 22 05/24/2023 1012   ALT 13 05/24/2023 1012   ALKPHOS 111 05/24/2023 1012   BILITOT 0.2 05/24/2023 1012   GFRNONAA 83 11/04/2019 1433   GFRAA 96 11/04/2019 1433   Lab Results  Component Value Date   CHOL 248 (H) 11/21/2016   HDL 43 11/21/2016   LDLCALC 155 (H) 11/21/2016   LDLDIRECT 136.0 10/21/2014   TRIG 249 (H) 11/21/2016   CHOLHDL 5.8 (H) 11/21/2016   No results found for: HGBA1C Lab Results  Component Value Date   VITAMINB12 239 07/07/2021   Lab Results  Component Value Date   TSH 5.45 (H) 10/16/2013       ASSESSMENT AND PLAN  Cervical  radiculopathy  Multiple sclerosis (HCC)  High risk medication use  Other fatigue  ADD (attention deficit disorder) without hyperactivity  Right optic neuritis  Facet syndrome, lumbar   1.  The MS has been stable.  He will continue leflunomide .   We discussed he has active SPMS and some progression is typical as patients with MS age.   MRI shows 2-3 spinal plaques - one to the left in the corticospinal tract.   No change compared to the previous MRI. 2.   Continue methylphenidate  and armodafinil   3.   He has degenerative spine issues.  Pain is helped by gabapentin  and hydrocodone .  He will be having ACDF at C4-C6 soon.  Earlier this year he had radiofrequency ablation of some medial branch nerves in the lumbar spine.   4.   Stay active and exercise as tolerated 5.    Return to see me in 6 months or sooner if there are new or worsening neurologic symptoms.   This visit is part of a comprehensive longitudinal care medical relationship regarding the patients primary diagnosis of MS and related concerns.   Gizell Danser A. Vear, MD, [facility] 12/12/2023, 9:16 AM Certified in Neurology, Clinical Neurophysiology, Sleep Medicine and Neuroimaging  [facility] [address] [place], [place] [address] [phone]

## 2023-12-20 NOTE — Telephone Encounter (Signed)
 Copied from CRM [id number]. Topic: General - Other >> Dec 20, 2023  2:26 PM Cameron [name] wrote: Reason for CRM: Patient is following up with [facility] regarding surgical clearance form, states the surgeon faxed over form that needed to be completed over a week ago and it hasn't been returned. Patient states he needs this surgery and the completed form is what's holding him up from scheduling his surgery, if form is completed please forward to Neurology office info below, if not please reach out.  [facility] [place] [phone] fax  [name] [phone]

## 2023-12-20 NOTE — Telephone Encounter (Signed)
 Spoke to pt. Advised him we had faxed it on 12-08-23. I refaxed it and got a fax confirmation. He will follow-up with them.

## 2023-12-22 ENCOUNTER — Other Ambulatory Visit: Payer: Self-pay

## 2023-12-22 ENCOUNTER — Other Ambulatory Visit: Payer: Self-pay | Admitting: Neurology

## 2023-12-22 DIAGNOSIS — G35 Multiple sclerosis: Secondary | ICD-10-CM

## 2023-12-25 ENCOUNTER — Other Ambulatory Visit: Payer: Self-pay

## 2024-01-01 ENCOUNTER — Other Ambulatory Visit: Payer: Self-pay | Admitting: Neurology

## 2024-01-01 MED ORDER — METHYLPHENIDATE HCL 20 MG PO TABS: ORAL_TABLET | ORAL | 0 refills | Status: DC

## 2024-01-01 NOTE — Telephone Encounter (Signed)
 Last seen on 12/12/23 Follow up scheduled on 07/16/24    Dispensed Days Supply Quantity Provider Pharmacy  methylphenidate  20 mg tablet 11/27/2023 30 60 tablet Sater, Charlie LABOR, MD [facility]...     Rx pending to be signed

## 2024-01-01 NOTE — Telephone Encounter (Signed)
 Pt called to request medication refill  methylphenidate  (RITALIN ) 20 MG tablet  Pt medication is to be sent to     [facility] [id number], [place] - [address] (Ph: [phone])

## 2024-01-04 ENCOUNTER — Encounter: Payer: Self-pay | Admitting: Neurology

## 2024-01-05 NOTE — Pre-Procedure Instructions (Signed)
 Surgical Instructions   Your procedure is scheduled on Thursday, January 11, 2024. Report to [facility] Main Entrance A at 5:30 A.M., then check in with the Admitting office. Any questions or running late day of surgery: call [phone]  Questions prior to your surgery date: call [phone], Monday-Friday, 8am-4pm. If you experience any cold or flu symptoms such as cough, fever, chills, shortness of breath, etc. between now and your scheduled surgery, please notify us  at the above number.     Remember:  Do not eat or drink after midnight the night before your surgery.    Take these medicines the morning of surgery with A SIP OF WATER : Gabapentin  (Neurontin ) Omeprazole (Prilosec)  May take these medicines IF NEEDED: Hydrocodone -acetaminophen  (Norco)  Ask you Doctor when to STOP your Leflunamide (Arava ). If no instructions were given, call your neurologist.  One week prior to surgery, STOP taking any Aspirin (unless otherwise instructed by your surgeon) Aleve , Naproxen , Ibuprofen , Motrin , Advil , Goody's, BC's, all herbal medications, fish oil, and non-prescription vitamins.                     Do NOT Smoke (Tobacco/Vaping) for 24 hours prior to your procedure.  If you use a CPAP at night, you may bring your mask/headgear for your overnight stay.   You will be asked to remove any contacts, glasses, piercing's, hearing aid's, dentures/partials prior to surgery. Please bring cases for these items if needed.    Patients discharged the day of surgery will not be allowed to drive home, and someone needs to stay with them for 24 hours.  SURGICAL WAITING ROOM VISITATION Patients may have no more than 2 support people in the waiting area - these visitors may rotate.   Pre-op nurse will coordinate an appropriate time for 1 ADULT support person, who may not rotate, to accompany patient in pre-op.  Children under the age of 19 must have an adult with them who is not the patient and  must remain in the main waiting area with an adult.  If the patient needs to stay at the hospital during part of their recovery, the visitor guidelines for inpatient rooms apply.  Please refer to the [facility] website for the visitor guidelines for any additional information.   If you received a COVID test during your pre-op visit  it is requested that you wear a mask when out in public, stay away from anyone that may not be feeling well and notify your surgeon if you develop symptoms. If you have been in contact with anyone that has tested positive in the last 10 days please notify you surgeon.      Pre-operative 5 CHG Bathing Instructions   You can play a key role in reducing the risk of infection after surgery. Your skin needs to be as free of germs as possible. You can reduce the number of germs on your skin by washing with CHG (chlorhexidine gluconate) soap before surgery. CHG is an antiseptic soap that kills germs and continues to kill germs even after washing.   DO NOT use if you have an allergy to chlorhexidine/CHG or antibacterial soaps. If your skin becomes reddened or irritated, stop using the CHG and notify one of our RNs at [phone].   Please shower with the CHG soap starting 4 days before surgery using the following schedule:     Please keep in mind the following:  DO NOT shave, including legs and underarms, starting the day of  your first shower.   You may shave your face at any point before/day of surgery.  Place clean sheets on your bed the day you start using CHG soap. Use a clean washcloth (not used since being washed) for each shower. DO NOT sleep with pets once you start using the CHG.   CHG Shower Instructions:  Wash your face and private area with normal soap. If you choose to wash your hair, wash first with your normal shampoo.  After you use shampoo/soap, rinse your hair and body thoroughly to remove shampoo/soap residue.  Turn the water OFF and apply  about 3 tablespoons (45 ml) of CHG soap to a CLEAN washcloth.  Apply CHG soap ONLY FROM YOUR NECK DOWN TO YOUR TOES (washing for 3-5 minutes)  DO NOT use CHG soap on face, private areas, open wounds, or sores.  Pay special attention to the area where your surgery is being performed.  If you are having back surgery, having someone wash your back for you may be helpful. Wait 2 minutes after CHG soap is applied, then you may rinse off the CHG soap.  Pat dry with a clean towel  Put on clean clothes/pajamas   If you choose to wear lotion, please use ONLY the CHG-compatible lotions that are listed below.  Additional instructions for the day of surgery: DO NOT APPLY any lotions, deodorants, cologne, or perfumes.   Do not bring valuables to the hospital. [facility] is not responsible for any belongings/valuables. Do not wear nail polish, gel polish, artificial nails, or any other type of covering on natural nails (fingers and toes) Do not wear jewelry or makeup Put on clean/comfortable clothes.  Please brush your teeth.  Ask your nurse before applying any prescription medications to the skin.     CHG Compatible Lotions   Aveeno Moisturizing lotion  Cetaphil Moisturizing Cream  Cetaphil Moisturizing Lotion  Clairol Herbal Essence Moisturizing Lotion, Dry Skin  Clairol Herbal Essence Moisturizing Lotion, Extra Dry Skin  Clairol Herbal Essence Moisturizing Lotion, Normal Skin  Curel Age Defying Therapeutic Moisturizing Lotion with Alpha Hydroxy  Curel Extreme Care Body Lotion  Curel Soothing Hands Moisturizing Hand Lotion  Curel Therapeutic Moisturizing Cream, Fragrance-Free  Curel Therapeutic Moisturizing Lotion, Fragrance-Free  Curel Therapeutic Moisturizing Lotion, Original Formula  Eucerin Daily Replenishing Lotion  Eucerin Dry Skin Therapy Plus Alpha Hydroxy Crme  Eucerin Dry Skin Therapy Plus Alpha Hydroxy Lotion  Eucerin Original Crme  Eucerin Original Lotion  Eucerin Plus  Crme Eucerin Plus Lotion  Eucerin TriLipid Replenishing Lotion  Keri Anti-Bacterial Hand Lotion  Keri Deep Conditioning Original Lotion Dry Skin Formula Softly Scented  Keri Deep Conditioning Original Lotion, Fragrance Free Sensitive Skin Formula  Keri Lotion Fast Absorbing Fragrance Free Sensitive Skin Formula  Keri Lotion Fast Absorbing Softly Scented Dry Skin Formula  Keri Original Lotion  Keri Skin Renewal Lotion Keri Silky Smooth Lotion  Keri Silky Smooth Sensitive Skin Lotion  Nivea Body Creamy Conditioning Oil  Nivea Body Extra Enriched Lotion  Nivea Body Original Lotion  Nivea Body Sheer Moisturizing Lotion Nivea Crme  Nivea Skin Firming Lotion  NutraDerm 30 Skin Lotion  NutraDerm Skin Lotion  NutraDerm Therapeutic Skin Cream  NutraDerm Therapeutic Skin Lotion  ProShield Protective Hand Cream  Provon moisturizing lotion  Please read over the following fact sheets that you were given.

## 2024-01-08 ENCOUNTER — Other Ambulatory Visit: Payer: Self-pay

## 2024-01-08 ENCOUNTER — Encounter (HOSPITAL_COMMUNITY): Payer: Self-pay

## 2024-01-08 ENCOUNTER — Encounter (HOSPITAL_COMMUNITY)
Admission: RE | Admit: 2024-01-08 | Discharge: 2024-01-08 | Disposition: A | Discharge status: Home / Self Care | Source: Ambulatory Visit

## 2024-01-08 VITALS — BP 144/99 | HR 89 | Temp 98.2°F | Resp 18 | Ht 70.0 in | Wt 180.7 lb

## 2024-01-08 DIAGNOSIS — G988 Other disorders of nervous system: Secondary | ICD-10-CM | POA: Insufficient documentation

## 2024-01-08 DIAGNOSIS — Z01812 Encounter for preprocedural laboratory examination: Secondary | ICD-10-CM | POA: Insufficient documentation

## 2024-01-08 DIAGNOSIS — Z01818 Encounter for other preprocedural examination: Secondary | ICD-10-CM

## 2024-01-08 HISTORY — DX: Nausea with vomiting, unspecified: R11.2

## 2024-01-08 HISTORY — DX: Other specified postprocedural states: Z98.890

## 2024-01-08 LAB — CBC
HCT: 43.8 % (ref 39.0–52.0)
Hemoglobin: 15 g/dL (ref 13.0–17.0)
MCH: 30.9 pg (ref 26.0–34.0)
MCHC: 34.2 g/dL (ref 30.0–36.0)
MCV: 90.3 fL (ref 80.0–100.0)
Platelets: 243 K/uL (ref 150–400)
RBC: 4.85 MIL/uL (ref 4.22–5.81)
RDW: 11.9 % (ref 11.5–15.5)
WBC: 4.6 K/uL (ref 4.0–10.5)
nRBC: 0 % (ref 0.0–0.2)

## 2024-01-08 LAB — BASIC METABOLIC PANEL WITH GFR
Anion gap: 11 (ref 5–15)
BUN: 10 mg/dL (ref 8–23)
CO2: 25 mmol/L (ref 22–32)
Calcium: 9.3 mg/dL (ref 8.9–10.3)
Chloride: 101 mmol/L (ref 98–111)
Creatinine, Ser: 0.8 mg/dL (ref 0.61–1.24)
GFR, Estimated: >60 mL/min (ref >60)
Glucose, Bld: 87 mg/dL (ref 70–99)
Potassium: 4.4 mmol/L (ref 3.5–5.1)
Sodium: 137 mmol/L (ref 135–145)

## 2024-01-08 LAB — SURGICAL PCR SCREEN
MRSA, PCR: NEGATIVE
Staphylococcus aureus: POSITIVE — AB

## 2024-01-08 LAB — TYPE AND SCREEN
ABO/RH(D): O POS
Antibody Screen: NEGATIVE

## 2024-01-08 NOTE — Progress Notes (Signed)
 PCP - Vear Ade, MD Neurologist - Vear Ade, MD Cardiologist - denies  PPM/ICD - denies Device Orders - n/a Rep Notified - n/a  Chest x-ray - denies EKG - 12/22/2017 Stress Test - denies ECHO - 11/21/2005 Cardiac Cath - denies  Sleep Study - denies CPAP - n/a  Fasting Blood Sugar - no DM Checks Blood Sugar _____ times a day  Last dose of GLP1 agonist-  n/a GLP1 instructions: n/a  Blood Thinner Instructions: n/a Aspirin Instructions: n/a  ERAS Protcol - no, NPO PRE-SURGERY Ensure or G2-   COVID TEST- n/a   Anesthesia review: yes, hx of MS; Echo in 2007  Patient denies shortness of breath, fever, cough and chest pain at PAT appointment   All instructions explained to the patient, with a verbal understanding of the material. Patient agrees to go over the instructions while at home for a better understanding. Patient also instructed to self quarantine after being tested for COVID-19. The opportunity to ask questions was provided.

## 2024-01-11 ENCOUNTER — Encounter (HOSPITAL_COMMUNITY): Admission: RE | Disposition: A | Discharge status: Home / Self Care | Payer: Self-pay | Source: Home / Self Care

## 2024-01-11 ENCOUNTER — Inpatient Hospital Stay (HOSPITAL_COMMUNITY)

## 2024-01-11 ENCOUNTER — Other Ambulatory Visit: Payer: Self-pay

## 2024-01-11 ENCOUNTER — Inpatient Hospital Stay (HOSPITAL_COMMUNITY)
Admission: RE | Admit: 2024-01-11 | Discharge: 2024-01-13 | DRG: 029 | Disposition: A | Discharge status: Home / Self Care

## 2024-01-11 ENCOUNTER — Encounter (HOSPITAL_COMMUNITY): Payer: Self-pay

## 2024-01-11 ENCOUNTER — Inpatient Hospital Stay (HOSPITAL_COMMUNITY): Payer: Self-pay

## 2024-01-11 ENCOUNTER — Encounter (HOSPITAL_COMMUNITY): Payer: Self-pay | Admitting: Physician Assistant

## 2024-01-11 DIAGNOSIS — F419 Anxiety disorder, unspecified: Secondary | ICD-10-CM | POA: Diagnosis not present

## 2024-01-11 DIAGNOSIS — Z419 Encounter for procedure for purposes other than remedying health state, unspecified: Principal | ICD-10-CM

## 2024-01-11 DIAGNOSIS — M5412 Radiculopathy, cervical region: Principal | ICD-10-CM | POA: Diagnosis present

## 2024-01-11 DIAGNOSIS — Z833 Family history of diabetes mellitus: Secondary | ICD-10-CM

## 2024-01-11 DIAGNOSIS — Z87442 Personal history of urinary calculi: Secondary | ICD-10-CM

## 2024-01-11 DIAGNOSIS — M4120 Other idiopathic scoliosis, site unspecified: Secondary | ICD-10-CM | POA: Diagnosis present

## 2024-01-11 DIAGNOSIS — G35 Multiple sclerosis: Secondary | ICD-10-CM | POA: Diagnosis present

## 2024-01-11 DIAGNOSIS — Z880 Allergy status to penicillin: Secondary | ICD-10-CM

## 2024-01-11 DIAGNOSIS — Z8249 Family history of ischemic heart disease and other diseases of the circulatory system: Secondary | ICD-10-CM

## 2024-01-11 DIAGNOSIS — R1310 Dysphagia, unspecified: Secondary | ICD-10-CM | POA: Diagnosis not present

## 2024-01-11 DIAGNOSIS — G959 Disease of spinal cord, unspecified: Secondary | ICD-10-CM | POA: Diagnosis present

## 2024-01-11 DIAGNOSIS — G43909 Migraine, unspecified, not intractable, without status migrainosus: Secondary | ICD-10-CM | POA: Diagnosis present

## 2024-01-11 DIAGNOSIS — Z860101 Personal history of adenomatous and serrated colon polyps: Secondary | ICD-10-CM | POA: Diagnosis not present

## 2024-01-11 DIAGNOSIS — M21372 Foot drop, left foot: Secondary | ICD-10-CM | POA: Diagnosis present

## 2024-01-11 DIAGNOSIS — K219 Gastro-esophageal reflux disease without esophagitis: Secondary | ICD-10-CM | POA: Diagnosis present

## 2024-01-11 DIAGNOSIS — Z888 Allergy status to other drugs, medicaments and biological substances status: Secondary | ICD-10-CM

## 2024-01-11 DIAGNOSIS — N400 Enlarged prostate without lower urinary tract symptoms: Secondary | ICD-10-CM | POA: Diagnosis present

## 2024-01-11 DIAGNOSIS — Z79899 Other long term (current) drug therapy: Secondary | ICD-10-CM

## 2024-01-11 DIAGNOSIS — E785 Hyperlipidemia, unspecified: Secondary | ICD-10-CM | POA: Diagnosis present

## 2024-01-11 DIAGNOSIS — F988 Other specified behavioral and emotional disorders with onset usually occurring in childhood and adolescence: Secondary | ICD-10-CM | POA: Diagnosis present

## 2024-01-11 HISTORY — PX: ANTERIOR CERVICAL DECOMP/DISCECTOMY FUSION: SHX1161

## 2024-01-11 LAB — ABO/RH: ABO/RH(D): O POS

## 2024-01-11 SURGERY — ANTERIOR CERVICAL DECOMPRESSION/DISCECTOMY FUSION 3 LEVELS
Anesthesia: General | Site: Spine Cervical

## 2024-01-11 MED ORDER — ONDANSETRON HCL 4 MG/2ML IJ SOLN
INTRAMUSCULAR | Status: DC | PRN
  Administered 2024-01-11: 4 mg via INTRAVENOUS

## 2024-01-11 MED ORDER — LEFLUNOMIDE 10 MG PO TABS
20.0000 mg | ORAL_TABLET | Freq: Every day | ORAL | Status: DC
  Administered 2024-01-13: 20 mg via ORAL
  Filled 2024-01-11 (×3): qty 2

## 2024-01-11 MED ORDER — GABAPENTIN 300 MG PO CAPS: 300.0000 mg | ORAL_CAPSULE | Freq: Every day | ORAL | Status: DC

## 2024-01-11 MED ORDER — PANTOPRAZOLE SODIUM 40 MG PO TBEC
40.0000 mg | DELAYED_RELEASE_TABLET | Freq: Every day | ORAL | Status: DC
  Administered 2024-01-13: 40 mg via ORAL
  Filled 2024-01-11 (×2): qty 1

## 2024-01-11 MED ORDER — ONDANSETRON HCL 4 MG/2ML IJ SOLN: 4.0000 mg | Freq: Once | INTRAMUSCULAR | Status: DC | PRN

## 2024-01-11 MED ORDER — ACETAMINOPHEN 325 MG PO TABS: 650.0000 mg | ORAL_TABLET | ORAL | Status: DC | PRN

## 2024-01-11 MED ORDER — CYCLOBENZAPRINE HCL 10 MG PO TABS
10.0000 mg | ORAL_TABLET | Freq: Three times a day (TID) | ORAL | Status: DC | PRN
  Administered 2024-01-11 – 2024-01-12 (×2): 10 mg via ORAL
  Filled 2024-01-11 (×3): qty 1

## 2024-01-11 MED ORDER — ORAL CARE MOUTH RINSE: 15.0000 mL | Freq: Once | OROMUCOSAL | Status: AC

## 2024-01-11 MED ORDER — METHYLPHENIDATE HCL 5 MG PO TABS
5.0000 mg | ORAL_TABLET | Freq: Two times a day (BID) | ORAL | Status: DC
Start: 2024-01-12 — End: 2024-01-13
  Administered 2024-01-12 – 2024-01-13 (×4): 5 mg via ORAL
  Filled 2024-01-11 (×4): qty 1

## 2024-01-11 MED ORDER — PROPOFOL 10 MG/ML IV BOLUS
INTRAVENOUS | Status: AC
  Filled 2024-01-11: qty 20

## 2024-01-11 MED ORDER — CYCLOBENZAPRINE HCL 10 MG PO TABS
ORAL_TABLET | ORAL | Status: AC
  Filled 2024-01-11: qty 1

## 2024-01-11 MED ORDER — SODIUM CHLORIDE 0.9 % IV SOLN: INTRAVENOUS | Status: DC | PRN

## 2024-01-11 MED ORDER — DEXAMETHASONE SODIUM PHOSPHATE 10 MG/ML IJ SOLN
INTRAMUSCULAR | Status: DC | PRN
Start: 2024-01-11 — End: 2024-01-11
  Administered 2024-01-11: 10 mg via INTRAVENOUS

## 2024-01-11 MED ORDER — OXYCODONE HCL 5 MG PO TABS
5.0000 mg | ORAL_TABLET | ORAL | Status: DC | PRN
  Administered 2024-01-11 – 2024-01-13 (×7): 5 mg via ORAL
  Filled 2024-01-11 (×8): qty 1

## 2024-01-11 MED ORDER — PHENYLEPHRINE 80 MCG/ML (10ML) SYRINGE FOR IV PUSH (FOR BLOOD PRESSURE SUPPORT)
PREFILLED_SYRINGE | INTRAVENOUS | Status: DC | PRN
Start: 2024-01-11 — End: 2024-01-11
  Administered 2024-01-11: 160 ug via INTRAVENOUS

## 2024-01-11 MED ORDER — FENTANYL CITRATE (PF) 100 MCG/2ML IJ SOLN
25.0000 ug | INTRAMUSCULAR | Status: DC | PRN
  Administered 2024-01-11 (×4): 25 ug via INTRAVENOUS
  Administered 2024-01-11: 50 ug via INTRAVENOUS

## 2024-01-11 MED ORDER — PROPOFOL 1000 MG/100ML IV EMUL
INTRAVENOUS | Status: AC
  Filled 2024-01-11: qty 100

## 2024-01-11 MED ORDER — LIDOCAINE 2% (20 MG/ML) 5 ML SYRINGE
INTRAMUSCULAR | Status: AC
  Filled 2024-01-11: qty 5

## 2024-01-11 MED ORDER — MUPIROCIN 2 % EX OINT: 1.0000 | TOPICAL_OINTMENT | Freq: Two times a day (BID) | CUTANEOUS | 0 refills | Status: AC

## 2024-01-11 MED ORDER — CHLORHEXIDINE GLUCONATE CLOTH 2 % EX PADS: 6.0000 | MEDICATED_PAD | Freq: Once | CUTANEOUS | Status: DC

## 2024-01-11 MED ORDER — 0.9 % SODIUM CHLORIDE (POUR BTL) OPTIME
TOPICAL | Status: DC | PRN
  Administered 2024-01-11: 1000 mL

## 2024-01-11 MED ORDER — ONDANSETRON HCL 4 MG/2ML IJ SOLN: 4.0000 mg | Freq: Four times a day (QID) | INTRAMUSCULAR | Status: DC | PRN

## 2024-01-11 MED ORDER — FENTANYL CITRATE (PF) 100 MCG/2ML IJ SOLN
INTRAMUSCULAR | Status: AC
  Filled 2024-01-11: qty 2

## 2024-01-11 MED ORDER — ACETAMINOPHEN 10 MG/ML IV SOLN
1000.0000 mg | Freq: Once | INTRAVENOUS | Status: DC | PRN
  Administered 2024-01-11: 1000 mg via INTRAVENOUS

## 2024-01-11 MED ORDER — SODIUM CHLORIDE 0.9 % IV SOLN: 250.0000 mL | INTRAVENOUS | Status: AC

## 2024-01-11 MED ORDER — ONDANSETRON HCL 4 MG/2ML IJ SOLN
INTRAMUSCULAR | Status: AC
  Filled 2024-01-11: qty 2

## 2024-01-11 MED ORDER — THROMBIN 5000 UNITS EX KIT
PACK | CUTANEOUS | Status: AC
  Filled 2024-01-11: qty 1

## 2024-01-11 MED ORDER — POLYETHYLENE GLYCOL 3350 17 G PO PACK
17.0000 g | PACK | Freq: Every day | ORAL | Status: DC
  Administered 2024-01-11 – 2024-01-13 (×3): 17 g via ORAL
  Filled 2024-01-11 (×3): qty 1

## 2024-01-11 MED ORDER — MENTHOL 3 MG MT LOZG: 1.0000 | LOZENGE | OROMUCOSAL | Status: DC | PRN

## 2024-01-11 MED ORDER — PROPOFOL 10 MG/ML IV BOLUS
INTRAVENOUS | Status: DC | PRN
  Administered 2024-01-11: 120 mg via INTRAVENOUS
  Administered 2024-01-11: 100 ug/kg/min via INTRAVENOUS

## 2024-01-11 MED ORDER — MODAFINIL 100 MG PO TABS
200.0000 mg | ORAL_TABLET | Freq: Every day | ORAL | Status: DC
  Administered 2024-01-13: 200 mg via ORAL
  Filled 2024-01-11 (×2): qty 2

## 2024-01-11 MED ORDER — LACTATED RINGERS IV SOLN
INTRAVENOUS | Status: DC
Start: 2024-01-11 — End: 2024-01-11

## 2024-01-11 MED ORDER — PHENOL 1.4 % MT LIQD: 1.0000 | OROMUCOSAL | Status: DC | PRN

## 2024-01-11 MED ORDER — GABAPENTIN 100 MG PO CAPS
100.0000 mg | ORAL_CAPSULE | Freq: Every day | ORAL | Status: DC
  Administered 2024-01-12 – 2024-01-13 (×2): 100 mg via ORAL
  Filled 2024-01-11 (×2): qty 1

## 2024-01-11 MED ORDER — VANCOMYCIN HCL IN DEXTROSE 1-5 GM/200ML-% IV SOLN
1000.0000 mg | INTRAVENOUS | Status: AC
  Administered 2024-01-11: 1000 mg via INTRAVENOUS
  Filled 2024-01-11: qty 200

## 2024-01-11 MED ORDER — ONDANSETRON HCL 4 MG PO TABS: 4.0000 mg | ORAL_TABLET | Freq: Four times a day (QID) | ORAL | Status: DC | PRN

## 2024-01-11 MED ORDER — MIDAZOLAM HCL 2 MG/2ML IJ SOLN
INTRAMUSCULAR | Status: AC
  Filled 2024-01-11: qty 2

## 2024-01-11 MED ORDER — GABAPENTIN 100 MG PO CAPS: 100.0000 mg | ORAL_CAPSULE | Freq: Two times a day (BID) | ORAL | Status: DC

## 2024-01-11 MED ORDER — SUCCINYLCHOLINE CHLORIDE 200 MG/10ML IV SOSY
PREFILLED_SYRINGE | INTRAVENOUS | Status: DC | PRN
  Administered 2024-01-11: 100 mg via INTRAVENOUS

## 2024-01-11 MED ORDER — CHLORHEXIDINE GLUCONATE 4 % EX SOLN: 1.0000 | CUTANEOUS | 1 refills | Status: AC

## 2024-01-11 MED ORDER — MIDAZOLAM HCL 2 MG/2ML IJ SOLN
INTRAMUSCULAR | Status: DC | PRN
  Administered 2024-01-11: 2 mg via INTRAVENOUS

## 2024-01-11 MED ORDER — THROMBIN 5000 UNITS EX SOLR
OROMUCOSAL | Status: DC | PRN
  Administered 2024-01-11: 5 mL via TOPICAL

## 2024-01-11 MED ORDER — FENTANYL CITRATE (PF) 250 MCG/5ML IJ SOLN
INTRAMUSCULAR | Status: DC | PRN
  Administered 2024-01-11: 50 ug via INTRAVENOUS
  Administered 2024-01-11: 100 ug via INTRAVENOUS
  Administered 2024-01-11: 25 ug via INTRAVENOUS
  Administered 2024-01-11: 75 ug via INTRAVENOUS

## 2024-01-11 MED ORDER — HYDROMORPHONE HCL 1 MG/ML IJ SOLN
0.5000 mg | INTRAMUSCULAR | Status: DC | PRN
  Administered 2024-01-11 – 2024-01-12 (×6): 0.5 mg via INTRAVENOUS
  Filled 2024-01-11 (×5): qty 0.5

## 2024-01-11 MED ORDER — LIDOCAINE-EPINEPHRINE 1 %-1:100000 IJ SOLN
INTRAMUSCULAR | Status: DC | PRN
  Administered 2024-01-11: 8 mL

## 2024-01-11 MED ORDER — GABAPENTIN 400 MG PO CAPS
400.0000 mg | ORAL_CAPSULE | Freq: Every day | ORAL | Status: DC
  Administered 2024-01-11 – 2024-01-12 (×2): 400 mg via ORAL
  Filled 2024-01-11 (×2): qty 1

## 2024-01-11 MED ORDER — FENTANYL CITRATE (PF) 250 MCG/5ML IJ SOLN
INTRAMUSCULAR | Status: AC
  Filled 2024-01-11: qty 5

## 2024-01-11 MED ORDER — LIDOCAINE-EPINEPHRINE 1 %-1:100000 IJ SOLN
INTRAMUSCULAR | Status: AC
  Filled 2024-01-11: qty 1

## 2024-01-11 MED ORDER — LIDOCAINE 2% (20 MG/ML) 5 ML SYRINGE
INTRAMUSCULAR | Status: DC | PRN
Start: 2024-01-11 — End: 2024-01-11
  Administered 2024-01-11: 100 mg via INTRAVENOUS

## 2024-01-11 MED ORDER — ARMODAFINIL 200 MG PO TABS
1.0000 | ORAL_TABLET | Freq: Every morning | ORAL | Status: DC
Start: 2024-01-12 — End: 2024-01-11

## 2024-01-11 MED ORDER — ENOXAPARIN SODIUM 40 MG/0.4ML IJ SOSY
40.0000 mg | PREFILLED_SYRINGE | INTRAMUSCULAR | Status: DC
Start: 2024-01-13 — End: 2024-01-13
  Administered 2024-01-13: 40 mg via SUBCUTANEOUS
  Filled 2024-01-11: qty 0.4

## 2024-01-11 MED ORDER — ACETAMINOPHEN 10 MG/ML IV SOLN
INTRAVENOUS | Status: AC
  Filled 2024-01-11: qty 100

## 2024-01-11 MED ORDER — OXYCODONE HCL 5 MG PO TABS: 5.0000 mg | ORAL_TABLET | Freq: Once | ORAL | Status: DC | PRN

## 2024-01-11 MED ORDER — ACETAMINOPHEN 650 MG RE SUPP: 650.0000 mg | RECTAL | Status: DC | PRN

## 2024-01-11 MED ORDER — GABAPENTIN 400 MG PO CAPS: 400.0000 mg | ORAL_CAPSULE | Freq: Two times a day (BID) | ORAL | Status: DC

## 2024-01-11 MED ORDER — SODIUM CHLORIDE 0.9% FLUSH
3.0000 mL | Freq: Two times a day (BID) | INTRAVENOUS | Status: DC
  Administered 2024-01-11 – 2024-01-13 (×4): 3 mL via INTRAVENOUS

## 2024-01-11 MED ORDER — DEXAMETHASONE SODIUM PHOSPHATE 10 MG/ML IJ SOLN
INTRAMUSCULAR | Status: AC
  Filled 2024-01-11: qty 1

## 2024-01-11 MED ORDER — HYDROMORPHONE HCL 1 MG/ML IJ SOLN
INTRAMUSCULAR | Status: AC
  Filled 2024-01-11: qty 1

## 2024-01-11 MED ORDER — CEFAZOLIN SODIUM-DEXTROSE 2-3 GM-%(50ML) IV SOLR
INTRAVENOUS | Status: DC | PRN
  Administered 2024-01-11: 2 g via INTRAVENOUS

## 2024-01-11 MED ORDER — PHENYLEPHRINE HCL-NACL 20-0.9 MG/250ML-% IV SOLN
INTRAVENOUS | Status: DC | PRN
  Administered 2024-01-11: 40 ug/min via INTRAVENOUS

## 2024-01-11 MED ORDER — SODIUM CHLORIDE 0.9 % IV SOLN: INTRAVENOUS | Status: DC

## 2024-01-11 MED ORDER — CHLORHEXIDINE GLUCONATE 0.12 % MT SOLN
15.0000 mL | Freq: Once | OROMUCOSAL | Status: AC
  Administered 2024-01-11: 15 mL via OROMUCOSAL
  Filled 2024-01-11: qty 15

## 2024-01-11 MED ORDER — DOCUSATE SODIUM 100 MG PO CAPS
100.0000 mg | ORAL_CAPSULE | Freq: Two times a day (BID) | ORAL | Status: DC
  Administered 2024-01-11 – 2024-01-13 (×5): 100 mg via ORAL
  Filled 2024-01-11 (×5): qty 1

## 2024-01-11 MED ORDER — PHENYLEPHRINE 80 MCG/ML (10ML) SYRINGE FOR IV PUSH (FOR BLOOD PRESSURE SUPPORT)
PREFILLED_SYRINGE | INTRAVENOUS | Status: AC
  Filled 2024-01-11: qty 10

## 2024-01-11 MED ORDER — HEMOSTATIC AGENTS (NO CHARGE) OPTIME
TOPICAL | Status: DC | PRN
  Administered 2024-01-11: 1 via TOPICAL

## 2024-01-11 MED ORDER — METOCLOPRAMIDE HCL 5 MG/ML IJ SOLN: 5.0000 mg | Freq: Four times a day (QID) | INTRAMUSCULAR | Status: DC | PRN

## 2024-01-11 MED ORDER — OXYCODONE HCL 5 MG/5ML PO SOLN: 5.0000 mg | Freq: Once | ORAL | Status: DC | PRN

## 2024-01-11 MED ORDER — SODIUM CHLORIDE 0.9% FLUSH: 3.0000 mL | INTRAVENOUS | Status: DC | PRN

## 2024-01-11 MED ORDER — CEFAZOLIN SODIUM-DEXTROSE 2-4 GM/100ML-% IV SOLN
2.0000 g | Freq: Four times a day (QID) | INTRAVENOUS | Status: AC
  Administered 2024-01-11 – 2024-01-12 (×2): 2 g via INTRAVENOUS
  Filled 2024-01-11 (×2): qty 100

## 2024-01-11 MED ORDER — ROCURONIUM BROMIDE 10 MG/ML (PF) SYRINGE
PREFILLED_SYRINGE | INTRAVENOUS | Status: AC
  Filled 2024-01-11: qty 10

## 2024-01-11 SURGICAL SUPPLY — 58 items
BAG COUNTER SPONGE SURGICOUNT (BAG) ×1 IMPLANT
BAND RUBBER #18 3X1/16 STRL (MISCELLANEOUS) ×2 IMPLANT
BASKET BONE COLLECTION (BASKET) ×1 IMPLANT
BIT DRILL NEURO 2X3.1 SFT TUCH (MISCELLANEOUS) IMPLANT
BLADE CLIPPER SURG (BLADE) IMPLANT
BUR MATCHSTICK NEURO 3.0 LAGG (BURR) ×1 IMPLANT
CAGE CERV MOD 6X17X14 7D (Cage) IMPLANT
CAGE CERV MOD 7X17X14 7D (Cage) IMPLANT
CANISTER SUCTION 3000ML PPV (SUCTIONS) ×1 IMPLANT
DERMABOND ADVANCED .7 DNX12 (GAUZE/BANDAGES/DRESSINGS) ×1 IMPLANT
DRAIN 1/8 RD END PERF LFSIL ST (DRAIN) IMPLANT
DRAPE C-ARM 42X72 X-RAY (DRAPES) ×2 IMPLANT
DRAPE HALF SHEET 40X57 (DRAPES) IMPLANT
DRAPE LAPAROTOMY 100X72 PEDS (DRAPES) ×1 IMPLANT
DRAPE MICROSCOPE SLANT 54X150 (MISCELLANEOUS) ×1 IMPLANT
DRAPE UTILITY XL STRL (DRAPES) ×1 IMPLANT
DURAPREP 26ML APPLICATOR (WOUND CARE) ×2 IMPLANT
ELECTRODE BLADE INSULATED 4IN (ELECTROSURGICAL) ×1 IMPLANT
ELECTRODE REM PT RTRN 9FT ADLT (ELECTROSURGICAL) ×1 IMPLANT
EVACUATOR SILICONE 100CC (DRAIN) IMPLANT
FEE INTRAOP CADWELL SUPPLY NCS (MISCELLANEOUS) IMPLANT
FEE INTRAOP MONITOR IMPULS NCS (MISCELLANEOUS) IMPLANT
FIBER BONE ALLOSYNC EXPAND 2.5 (Bone Implant) IMPLANT
GAUZE 4X4 16PLY ~~LOC~~+RFID DBL (SPONGE) IMPLANT
GLOVE INDICATOR 8.5 STRL (GLOVE) ×1 IMPLANT
GLOVE SURG SYN 8.0 PF PI (GLOVE) ×1 IMPLANT
GOWN STRL REUS W/ TWL LRG LVL3 (GOWN DISPOSABLE) ×2 IMPLANT
GOWN STRL REUS W/ TWL XL LVL3 (GOWN DISPOSABLE) ×1 IMPLANT
GOWN STRL REUS W/TWL 2XL LVL3 (GOWN DISPOSABLE) IMPLANT
HEMOSTAT POWDER KIT SURGIFOAM (HEMOSTASIS) ×1 IMPLANT
KIT BASIN OR (CUSTOM PROCEDURE TRAY) ×1 IMPLANT
KIT TURNOVER KIT B (KITS) ×1 IMPLANT
NDL HYPO 22X1.5 SAFETY MO (MISCELLANEOUS) ×1 IMPLANT
NDL SPNL 22GX3.5 QUINCKE BK (NEEDLE) ×1 IMPLANT
NEEDLE HYPO 22X1.5 SAFETY MO (MISCELLANEOUS) ×1 IMPLANT
NEEDLE SPNL 22GX3.5 QUINCKE BK (NEEDLE) ×1 IMPLANT
PACK LAMINECTOMY NEURO (CUSTOM PROCEDURE TRAY) ×1 IMPLANT
PAD ARMBOARD POSITIONER FOAM (MISCELLANEOUS) ×3 IMPLANT
PIN DISTRACTION MAXCESS-C 14 (PIN) IMPLANT
PIN DISTRACTION TAP 12 DISP (PIN) ×2 IMPLANT
PLATE ACP 1.9X56 3LVL (Screw) IMPLANT
RESTRAINT LIMB HOLDER UNIV (RESTRAINTS) ×1 IMPLANT
SCREW ACP 3.5X17 S/D VARIA (Screw) IMPLANT
SCREW ACP VA SD 3.5X15 (Screw) IMPLANT
SOLN 0.9% NACL 1000 ML (IV SOLUTION) ×1 IMPLANT
SOLN 0.9% NACL POUR BTL 1000ML (IV SOLUTION) ×1 IMPLANT
SOLN STERILE WATER 1000 ML (IV SOLUTION) ×1 IMPLANT
SOLN STERILE WATER BTL 1000 ML (IV SOLUTION) ×1 IMPLANT
SPIKE FLUID TRANSFER (MISCELLANEOUS) ×1 IMPLANT
SPONGE INTESTINAL PEANUT (DISPOSABLE) ×1 IMPLANT
SPONGE SURGIFOAM ABS GEL SZ50 (HEMOSTASIS) ×1 IMPLANT
SUT MNCRL AB 4-0 PS2 18 (SUTURE) ×1 IMPLANT
SUT SILK 2 0 TIES 10X30 (SUTURE) IMPLANT
SUT VIC AB 3-0 SH 8-18 (SUTURE) ×1 IMPLANT
SYR 3ML LL SCALE MARK (SYRINGE) ×1 IMPLANT
TAPE CLOTH 3X10 TAN LF (GAUZE/BANDAGES/DRESSINGS) ×1 IMPLANT
TOWEL GREEN STERILE (TOWEL DISPOSABLE) ×1 IMPLANT
TOWEL GREEN STERILE FF (TOWEL DISPOSABLE) ×1 IMPLANT

## 2024-01-11 NOTE — Anesthesia Preprocedure Evaluation (Signed)
 Anesthesia Evaluation  Patient identified by MRN, date of birth, ID band Patient awake    Reviewed: Allergy & Precautions, NPO status , Patient's Chart, lab work & pertinent test results, reviewed documented beta blocker date and time   History of Anesthesia Complications (+) PONV and history of anesthetic complications  Airway Mallampati: II  TM Distance: >3 FB     Dental no notable dental hx.    Pulmonary neg COPD   breath sounds clear to auscultation       Cardiovascular (-) angina (-) CAD, (-) Past MI and (-) Cardiac Stents  Rhythm:Regular Rate:Normal     Neuro/Psych  Headaches PSYCHIATRIC DISORDERS Anxiety      Neuromuscular disease (MS)    GI/Hepatic ,GERD  ,,  Endo/Other    Renal/GU Renal disease     Musculoskeletal  (+) Arthritis ,    Abdominal   Peds  Hematology   Anesthesia Other Findings   Reproductive/Obstetrics                              Anesthesia Physical Anesthesia Plan  ASA: 3  Anesthesia Plan: General   Post-op Pain Management:    Induction: Intravenous  PONV Risk Score and Plan: 2 and Ondansetron  and Dexamethasone   Airway Management Planned: Oral ETT and Video Laryngoscope Planned  Additional Equipment:   Intra-op Plan:   Post-operative Plan: Extubation in OR  Informed Consent: I have reviewed the patients History and Physical, chart, labs and discussed the procedure including the risks, benefits and alternatives for the proposed anesthesia with the patient or authorized representative who has indicated his/her understanding and acceptance.     Dental advisory given  Plan Discussed with: CRNA  Anesthesia Plan Comments:         Anesthesia Quick Evaluation

## 2024-01-11 NOTE — Discharge Instructions (Signed)
 Wound Care No dressing is required over your incision. Keep clean and dry. You can shower 24 hours after surgery. Let water run over incision. Do not scrub. Pat dry Do not put any creams, lotions, or ointments on incision. Do not submerge your incision for the first 6 weeks (pool, ocean, etc)  Activity Walk each and every day, increasing distance each day. No lifting greater than 10 lbs.  Avoid excessive neck motion.  Diet Resume your normal diet.  Call Your Doctor If Any of These Occur Redness, drainage, or swelling at the wound.  Temperature greater than 101 degrees. Severe pain not relieved by pain medication. Incision starts to come apart.  Follow Up Appt Call [phone] today for appointment in 6 weeks if you don't already have one or for any problems.  If you have any hardware placed in your spine, you will need an x-ray before your appointment.

## 2024-01-11 NOTE — Progress Notes (Signed)
 [name] who presented with left cervical radiculopathy and myelopathy, now s/p C4-7 ACDF (9/25)  LOS: 0 days    Plan: Postop films AAT DAT Lovenox  on 9/27   Subjective: Pt awoke without issue. He denied arm pain  Objective: Vital signs in last 24 hours: Temp:  [97.9 F (36.6 C)-98.2 F (36.8 C)] 97.9 F (36.6 C) (09/25 1120) Pulse Rate:  [72-90] 90 (09/25 1120) Resp:  [14-17] 14 (09/25 1120) BP: (132-143)/(95) 143/95 (09/25 1120) SpO2:  [97 %] 97 % (09/25 1120) Weight:  [81.6 kg] 81.6 kg (09/25 0545)  Intake/Output from previous day: No intake/output data recorded. Intake/Output this shift: Total I/O In: 1270 [I.V.:1250; IV Piggyback:20] Out: 330 [Urine:310; Blood:20]  Exam: RUE: 5/5 deltoid, 5/5 bicep, 5/5 wrist extension, 5/5 tricep, 5/5 grip LUE: 5/5 deltoid, 5/5 bicep, 5/5 wrist extension, 5/5 tricep, 5/5 grip RLE: 5/5 IP, 5/5 quad, 5/5 ham, 5/5 DF, 5/5 EHL, 5/5 PF LLE: 4/5 IP, 5/5 quad, 5/5 ham, 5/5 DF, 5/5 EHL, 5/5 PF Subjective numbness of the 2nd through 5th digits Anterior cervical incision c/d/i  Lab Results: No results for input(s): WBC, HGB, HCT, PLT in the last 72 hours. BMET No results for input(s): NA, K, CL, CO2, GLUCOSE, BUN, CREATININE, CALCIUM in the last 72 hours.  Studies/Results: DG C-Arm 1-60 Min-No Report Result Date: 01/11/2024 Fluoroscopy was utilized by the requesting physician.  No radiographic interpretation.   DG C-Arm 1-60 Min-No Report Result Date: 01/11/2024 Fluoroscopy was utilized by the requesting physician.  No radiographic interpretation.   DG C-Arm 1-60 Min-No Report Result Date: 01/11/2024 Fluoroscopy was utilized by the requesting physician.  No radiographic interpretation.      Cameron [name] 01/11/2024, 11:31 AM

## 2024-01-11 NOTE — H&P (Signed)
 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  He has undergone physical therapy which has not helped.  He underwent a C6-7 ESI a few weeks ago which provided temporary relief of his left-sided arm pain.  Of note, he also complains of constant left hand numbness and worsening fine motor difficulty.  He recently underwent an MRI brain which did not show any new multiple sclerosis lesions.  He does have chronic left lower extremity weakness due to multiple sclerosis which is unchanged.  He denies right-sided arm pain, right-sided motor symptoms, gait instability.  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   Hx of adenomatous colonic polyps 09/02/2021   Right optic neuritis 02/04/2021   Lumbar radiculopathy 06/25/2020   Lumbar disc herniation 05/01/2020   High risk medication use 11/04/2019   Vitamin D  deficiency 11/04/2019   Left foot drop 11/04/2019   Abnormal MRI of head 09/24/2019   Ureteral calculus 07/25/2018   Hydronephrosis with urinary obstruction due to ureteral calculus 07/25/2018   Sensorineural hearing loss (SNHL), bilateral 03/06/2017   Low back pain 05/29/2014   Routine general medical examination at a health care facility 02/23/2012   GERD (gastroesophageal reflux disease)    Hyperlipemia 10/03/2007   Seasonal allergic rhinitis due to  pollen 07/19/2006   RENAL CALCULUS 07/19/2006   Multiple sclerosis 10/16/2005   Past Medical History:  Diagnosis Date   Abnormal MRI 2007   nonspecific white matter abnormalities   Allergy    seasonal   Anxiety    no meds   Arthritis    spine   Cataract    GERD (gastroesophageal reflux disease)    Helicobacter pylori gastritis 07/20/2023        History of kidney stones    x 2 episodes- last 2020   Hx of adenomatous colonic polyps 09/02/2021   2 diminutive adenomas recall 2030   Idiopathic scoliosis    Migraines    OCULAR connected to MS   Multiple sclerosis    Neuromuscular disorder (HCC)    MS - mild left side spasicity   PONV (postoperative nausea and vomiting)    Vertigo    1 episode.  approx 12/2021.  Lasted 2-3 days.    Past Surgical History:  Procedure Laterality Date   CATARACT EXTRACTION W/PHACO Right 06/28/2023   Procedure: CATARACT EXTRACTION PHACO AND INTRAOCULAR LENS PLACEMENT (IOC) RIGHT  6.17  00:42.8;  Surgeon: Mittie Gaskin, MD;  Location: [facility];  Service: Ophthalmology;  Laterality: Right;   COLONOSCOPY  06/27/2007   Normal   CYSTOSCOPY/URETEROSCOPY/HOLMIUM LASER/STENT PLACEMENT Right 07/27/2018   Procedure: CYSTOSCOPY/URETEROSCOPY/HOLMIUM LASER/STENT PLACEMENT;  Surgeon: Twylla Glendia BROCKS, MD;  Location: [facility];  Service: Urology;  Laterality: Right;   LUMBAR DISC SURGERY     TONSILLECTOMY     URETEROLITHOTOMY  11/16/2001   US  ECHOCARDIOGRAPHY  11/16/2004   normal, Carotids normal  Medications Prior to Admission  Medication Sig Dispense Refill Last Dose/Taking   Armodafinil  200 MG TABS Take 1 tablet (200 mg total) by mouth in the morning. 30 tablet 5 01/08/2024   Cholecalciferol (DIALYVITE VITAMIN D  5000 PO) Take 5,000 Units by mouth daily.   01/10/2024   gabapentin  (NEURONTIN ) 100 MG capsule TAKE 1 CAPSULE BY MOUTH EVERY MORNING AND TAKE TWO CAPSULES BY MOUTH EVERY NIGHT AT BEDTIME (Patient taking differently: Take 100 mg by  mouth 2 (two) times daily.) 90 capsule 5 01/11/2024 at  4:30 AM   gabapentin  (NEURONTIN ) 300 MG capsule One po tid (Patient taking differently: Take 300 mg by mouth at bedtime.) 90 capsule 5 01/10/2024   HYDROcodone -acetaminophen  (NORCO) 7.5-325 MG tablet Take 0.5-1 tablets by mouth every 6 (six) hours as needed for moderate pain (pain score 4-6). (Patient taking differently: Take 1 tablet by mouth in the morning, at noon, and at bedtime.) 120 tablet 0 01/10/2024   leflunomide  (ARAVA ) 20 MG tablet TAKE 1 TABLET BY MOUTH DAILY 90 tablet 0 01/09/2024   methylphenidate  (RITALIN ) 20 MG tablet Take one po AM and one po at noon 60 tablet 0 01/08/2024   omeprazole (PRILOSEC) 20 MG capsule Take 20 mg by mouth daily.   01/11/2024 at  4:30 AM   ALPRAZolam  (XANAX ) 0.5 MG tablet Take 2 tablets thirty minutes prior to MRI.  May take one additional tablet before entering scanner, if needed.  MUST HAVE DRIVER. (Patient not taking: Reported on 01/04/2024) 3 tablet 0 Not Taking   cyclobenzaprine  (FLEXERIL ) 10 MG tablet Take 1 tablet (10 mg total) by mouth at bedtime. (Patient not taking: Reported on 01/04/2024) 90 tablet 3 Not Taking   fluticasone  (FLONASE ) 50 MCG/ACT nasal spray Place 2 sprays into both nostrils daily. In each nostril (Patient not taking: Reported on 01/04/2024) 16 g 12 Not Taking   hydrocortisone  (ANUCORT-HC ) 25 MG suppository UNWRAP AND INSERT 1 SUPPOSITORY INTO THE RECTUM THREE TIMES A DAY AS NEEDED 15 suppository 11 More than a month   ibuprofen  (ADVIL ,MOTRIN ) 200 MG tablet Take 200-400 mg by mouth every 6 (six) hours as needed for moderate pain (pain score 4-6).   01/04/2024   methylPREDNISolone  (MEDROL ) 4 MG tablet Taper from 6 pills po for one day to 1 pill po the last day over 6 days (Patient not taking: Reported on 12/12/2023) 21 tablet 0    Allergies  Allergen Reactions   Citalopram Hydrobromide Itching and Nausea Only   Penicillins Other (See Comments)    REACTION: questionable, Childhood reaction     Social History   Tobacco Use   Smoking status: Never    Passive exposure: Past   Smokeless tobacco: Never  Substance Use Topics   Alcohol use: Yes    Alcohol/week: 0.0 standard drinks of alcohol    Comment: rare- once a month    Family History  Problem Relation Age of Onset   Diabetes Father    Heart disease Father    Dementia Father    Lumbar disc disease Brother    Dementia Paternal Grandmother    CAD Neg Hx    Hypertension Neg Hx    Cancer Neg Hx    Colon cancer Neg Hx    Colon polyps Neg Hx    Esophageal cancer Neg Hx    Stomach cancer Neg Hx    Rectal cancer Neg Hx      Review of Systems Pertinent items are noted in HPI.  Objective:   Patient Vitals for the  past 8 hrs:  BP Temp Temp src Pulse Resp SpO2 Height Weight  01/11/24 0545 (!) 132/95 98.2 F (36.8 C) Oral 72 17 97 % 5' 10 (1.778 m) 81.6 kg   No intake/output data recorded. No intake/output data recorded.    Exam: NAD Normocephalic, atraumatic Neck supple Breathing comfortably Abdomen soft Extremities warm and well perfused Alert and oriented x3 Conjugate gaze, face symmetric, no dysarthria RUE: 5/5 deltoid, bicep, tricep, wrist extension, grip, interossei LUE: 5/5 deltoid, bicep, tricep, wrist extension, grip, interossei RLE: 5/5 IP, quad, ham, DF, EHL, PF LLE: 4/5 IP, quad, ham, DF, EHL, PF Subjective numbness of the circumferential left 2nd through 5th digits. No Hoffman or Babinski    Data ReviewCBC:  Lab Results  Component Value Date   WBC 4.6 01/08/2024   RBC 4.85 01/08/2024   BMP:  Lab Results  Component Value Date   GLUCOSE 87 01/08/2024   CO2 25 01/08/2024   BUN 10 01/08/2024   BUN 11 05/24/2023   CREATININE 0.80 01/08/2024   CREATININE 1.13 11/21/2016   CALCIUM 9.3 01/08/2024

## 2024-01-11 NOTE — Anesthesia Procedure Notes (Signed)
 Procedure Name: Intubation Date/Time: 01/11/2024 7:46 AM  Performed by: Roslynn Waddell LABOR, CRNAPre-anesthesia Checklist: Patient identified, Emergency Drugs available, Suction available and Patient being monitored Patient Re-evaluated:Patient Re-evaluated prior to induction Oxygen Delivery Method: Circle System Utilized Preoxygenation: Pre-oxygenation with 100% oxygen Induction Type: IV induction Ventilation: Mask ventilation without difficulty Laryngoscope Size: Glidescope and 3 Grade View: Grade I Tube type: Oral Number of attempts: 1 Airway Equipment and Method: Stylet and Oral airway Placement Confirmation: ETT inserted through vocal cords under direct vision, positive ETCO2 and breath sounds checked- equal and bilateral Secured at: 23 cm Tube secured with: Tape Dental Injury: Teeth and Oropharynx as per pre-operative assessment  Comments: Atraumatic induction/intubation. Dentition and oral mucosa as per preop. SBB in prior to motors/neuro monitoring.

## 2024-01-11 NOTE — Transfer of Care (Signed)
 Immediate Anesthesia Transfer of Care Note  Patient: Cameron [name]  Procedure(s) Performed: ANTERIOR CERVICAL DECOMPRESSION/DISCECTOMY FUSION CERVICAL FOUR-SEVEN (Spine Cervical)  Patient Location: PACU  Anesthesia Type:General  Level of Consciousness: awake  Airway & Oxygen Therapy: Patient Spontanous Breathing  Post-op Assessment: Report given to RN and Post -op Vital signs reviewed and stable  Post vital signs: Reviewed and stable  Last Vitals:  Vitals Value Taken Time  BP 143/95 01/11/24 11:20  Temp 36.6 C 01/11/24 11:20  Pulse 90 01/11/24 11:21  Resp 18 01/11/24 11:21  SpO2 97 % 01/11/24 11:21  Vitals shown include unfiled device data.  Last Pain:  Vitals:   01/11/24 0545  TempSrc: Oral  PainSc: 6          Complications: No notable events documented.

## 2024-01-11 NOTE — Op Note (Signed)
 PREOP DIAGNOSIS: radiculopathy, myelopathy  POSTOP DIAGNOSIS: same  PROCEDURE: - C4-7 ACDF 1. Arthrodesis C4-5, anterior interbody technique, including Discectomy for decompression of spinal cord and exiting nerve roots with foraminotomies  2. Arthrodesis, additional level C5-6 anterior interbody technique, including Discectomy for decompression of spinal cord and exiting nerve roots with foraminotomies  3. Arthrodesis, additional level C6-7anterior interbody technique, including Discectomy for decompression of spinal cord and exiting nerve roots with foraminotomies  5. Placement of intervertebral biomechanical device C4-5 6. Placement of intervertebral biomechanical device C5-6 7. Placement of intervertebral biomechanical device C6-7 9. Placement of anterior instrumentation consisting of interbody plate and screws C4-7 10. Use of morselized bone allograft  11. Use of intraoperative microscope  SURGEON: Dr. Dorn Glade, MD  ASSISTANT:  Dorn Ned, MD Please note there were no qualified trainees available to assist with the procedure.  Assistance required for retraction of the visceral structures to allow for safe instrumentation.  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  Risks, benefits, alternatives, and expected convalescence were discussed with the patient.  Risks discussed included but were not limited to bleeding, pain, infection, dysphagia, dysphonia, pseudoarthrosis, hardware failure, adjacent segment disease, CSF leak, neurologic deficits, weakness, numbness, paralysis, coma, and death. After all  questions were answered, informed consent was obtained.  PROCEDURE IN DETAIL: The patient was brought to the operating room and transferred to the operative table. After induction of general anesthesia, the patient was positioned on the operative table in the supine position with all pressure points meticulously padded. The skin of the neck was then prepped and draped in the usual sterile fashion. Preoperative antibiotics were administered. Lateral fluoroscopy was used to identify the appropriate level.  After timeout was conducted, the skin was infiltrated with local anesthetic. Skin incision was then made sharply and Bovie electrocautery was used to dissect the subcutaneous tissue until the platysma was identified. The platysma was then divided and undermined. The sternocleidomastoid muscle was then identified and, utilizing natural fascial planes in the neck, the prevertebral fascia was identified and the carotid sheath was retracted laterally and the trachea and esophagus retracted medially. Again using fluoroscopy, the C4-5 disc space was identified. Bovie electrocautery was used to dissect in the subperiosteal plane and elevate the bilateral longus coli muscles. Self-retaining retractors were then placed. Caspar distraction pins were placed in the adjacent bodies to allow for gentle distraction.  At this point, the microscope was draped and brought into the field, and the remainder of the case was done under the microscope using microdissecting technique.  The disc space was incised sharply and combination of high speed drill, curettes, and rongeurs were use to initially complete a discectomy. The high-speed drill was then used to complete discectomy until the posterior annulus was identified and removed and the posterior longitudinal ligament was identified. Using a nerve hook, the PLL was elevated, and Kerrison rongeurs were used to remove the posterior longitudinal ligament and the ventral thecal sac  was identified.  Using a combination of curettes and rongeurs, complete decompression of the thecal sac and exiting nerve roots at this level was completed, and verified with easy passage of micro-nerve hook centrally and in the bilateral foramina. Having completed our  decompression, attention was turned to placement of the intervertebral device. Trial spacers were used to select a size 6 mm graft. This graft was then filled with morcellized allograft, and inserted under live fluoroscopy.  Attention was then turned to the C5-6 level. Caspar distraction pin was placed in the adjacent body to allow for gentle distraction of the disc space.  In a similar fashion, discectomy was completed initially with curettes and rongeurs, and completed with the drill. The PLL was again identified, elevated and incised. Using Kerrison rongeurs, decompression of the spinal cord and exiting roots was completed and confirmed with a dissector. Trial spacers were used to select a 6 mm graft. This graft was then filled with morcellized allograft, and inserted under live fluoroscopy.  Attention was then turned to the C6-7 level. Caspar distraction pin was placed in the adjacent body to allow for gentle distraction of the disc space.  In a similar fashion, discectomy was completed initially with curettes and rongeurs, and completed with the drill. The PLL was again identified, elevated and incised. Using Kerrison rongeurs, decompression of the spinal cord and exiting roots was completed and confirmed with a dissector. Trial spacers were used to select a 7 mm graft. This graft was then filled with morcellized allograft, and inserted under live fluoroscopy.  After placement of the intervertebral devices, the caspar pins were removed.  An anterior cervical 56  mm plate was placed across the interspaces for anterior fixation.  Using a high-speed drill, the cortex of the cervical vertebral bodies was punctured, and screws inserted to secure  the plate. Final fluoroscopic images in AP and lateral projections were taken to confirm good hardware placement.  At this point, after all counts were verified to be correct, meticulous hemostasis was secured using a combination of bipolar electrocautery and passive hemostatics. The platysma muscle was then closed using interrupted 3-0 Vicryl sutures, and the skin was closed with a 4-0 monocryl in subcuticular fashion followed by steri-strips. Sterile dressings were then applied and the drapes removed.  The patient tolerated the procedure well and was extubated in the room and taken to the postanesthesia care unit in stable condition.  All counts were correct at the end of the procedure.

## 2024-01-12 ENCOUNTER — Inpatient Hospital Stay (HOSPITAL_COMMUNITY)

## 2024-01-12 ENCOUNTER — Encounter (HOSPITAL_COMMUNITY): Payer: Self-pay

## 2024-01-12 MED ORDER — ACETAMINOPHEN 325 MG PO TABS
650.0000 mg | ORAL_TABLET | ORAL | Status: DC
  Administered 2024-01-12 – 2024-01-13 (×6): 650 mg via ORAL
  Filled 2024-01-12 (×6): qty 2

## 2024-01-12 MED ORDER — ACETAMINOPHEN 650 MG RE SUPP: 650.0000 mg | RECTAL | Status: DC

## 2024-01-12 NOTE — Evaluation (Signed)
 Occupational Therapy Evaluation Patient Details Name: Cameron [name] MRN: [id number] DOB: [birth year] Today's Date: 01/12/2024   History of Present Illness   68 y.o. 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 S/p C4-C7 ACDF. PMHx: MS, vertigo     Clinical Impressions Patient admitted for above and presents with problem list below.  PTA pt was independent. Patient was educated on cervical precautions, ADL compensatory techniques, AE/DME, mobility progression, safety and recommendations.  Today, pt demonstrated ability to complete bed mobility with modified independence, transfers with modified independence, functional mobility with modified independence, and ADLs with modified independence.  Improved adherence to cervical precautions as session progressed.   At discharge, pt will have support from spouse and sister.  Based on performance today, no further OT needs identified.  OT will sign off.       If plan is discharge home, recommend the following:   Assistance with cooking/housework;Assist for transportation;Help with stairs or ramp for entrance     Functional Status Assessment   Patient has had a recent decline in their functional status and demonstrates the ability to make significant improvements in function in a reasonable and predictable amount of time.     Equipment Recommendations   None recommended by OT     Recommendations for Other Services         Precautions/Restrictions   Precautions Precautions: Cervical Precaution Booklet Issued: Yes (comment) Recall of Precautions/Restrictions: Intact Required Braces or Orthoses:  (no brace needed order) Restrictions Weight Bearing Restrictions Per Provider Order: No     Mobility Bed Mobility Overal bed mobility: Modified Independent                  Transfers Overall transfer level: Modified independent                         Balance Overall balance assessment: Mild deficits observed, not formally tested                                         ADL either performed or assessed with clinical judgement   ADL Overall ADL's : Modified independent                                             Vision Baseline Vision/History: 1 Wears glasses Ability to See in Adequate Light: 0 Adequate Patient Visual Report: No change from baseline Vision Assessment?: No apparent visual deficits     Perception         Praxis         Pertinent Vitals/Pain Pain Assessment Pain Assessment: 0-10 Pain Score: 4  Pain Location: neck, L shoulder Pain Descriptors / Indicators: Discomfort, Guarding, Operative site guarding Pain Intervention(s): Limited activity within patient's tolerance, Monitored during session, Repositioned     Extremity/Trunk Assessment Upper Extremity Assessment Upper Extremity Assessment: Right hand dominant;Overall WFL for tasks assessed (within cervical precautions)   Lower Extremity Assessment Lower Extremity Assessment: Defer to PT evaluation   Cervical / Trunk Assessment Cervical / Trunk Assessment: Neck Surgery   Communication Communication Communication: No apparent difficulties   Cognition Arousal: Alert Behavior During Therapy: WFL for tasks assessed/performed Cognition: No apparent impairments  Following commands: Intact       Cueing  General Comments   Cueing Techniques: Verbal cues  spouse at side and supportive   Exercises     Shoulder Instructions      Home Living Family/patient expects to be discharged to:: Private residence Living Arrangements: Spouse/significant other Available Help at Discharge: Family;Available 24 hours/day Type of Home: House Home Access: Level entry     Home Layout: One level     Bathroom Shower/Tub: Tub/shower unit;Walk-in shower   Bathroom Toilet:  Handicapped height     Home Equipment: Shower seat - built in (walking stick)   Additional Comments: spouse is taking off work, then sister will be present for 1 week      Prior Functioning/Environment Prior Level of Function : Independent/Modified Independent;Working/employed;Driving               ADLs Comments: retired from [employer]    OT Problem List: Pain;Decreased knowledge of precautions   OT Treatment/Interventions:        OT Goals(Current goals can be found in the care plan section)   Acute Rehab OT Goals Patient Stated Goal: home OT Goal Formulation: With patient   OT Frequency:       Co-evaluation              AM-PAC OT 6 Clicks Daily Activity     Outcome Measure Help from another person eating meals?: None Help from another person taking care of personal grooming?: None Help from another person toileting, which includes using toliet, bedpan, or urinal?: None Help from another person bathing (including washing, rinsing, drying)?: None Help from another person to put on and taking off regular upper body clothing?: None Help from another person to put on and taking off regular lower body clothing?: None 6 Click Score: 24   End of Session Nurse Communication: Mobility status  Activity Tolerance: Patient tolerated treatment well Patient left: in bed;with call bell/phone within reach;with family/visitor present  OT Visit Diagnosis: Other abnormalities of gait and mobility (R26.89);Pain Pain - part of body:  (neck, shoulder)                Time: 8894-8868 OT Time Calculation (min): 26 min Charges:  OT General Charges $OT Visit: 1 Visit OT Evaluation $OT Eval Low Complexity: 1 Low OT Treatments $Self Care/Home Management : 8-22 mins  Etta NOVAK, OT Acute Rehabilitation Services Office [phone] Secure Chat Preferred   Etta GORMAN Hope 01/12/2024, 12:22 PM

## 2024-01-12 NOTE — Evaluation (Signed)
 Physical Therapy Evaluation Patient Details Name: Cameron [name] MRN: [id number] DOB: 05-18-1955 Today's Date: 01/12/2024  History of Present Illness  68 y.o. 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 S/p C4-C7 ACDF. PMHx: MS, vertigo  Clinical Impression  Pt in recliner upon arrival and agreeable to PT eval. PTA pt was independent for mobility with no AD. Pt has hx of MS and was receiving neuro OP PT. Pt was able to recall all cervical precautions with no cueing. Pt presents close to functional mobility baseline being ModI to stand with no AD and ModI to ambulate 440ft with no AD. Slight drift to the left with limited WB on the left and stiff gait pattern. Pt attributes this to being in the bed and having decreased activity levels prior to admit. Pt will have 24/7 assist upon d/c home and feels comfortable discharging whenever medically stable. Recommending neuro OP PT to address functional deficits. Pt has no further acute PT needs with acute PT signing off. Please re-consult if new needs arise.         If plan is discharge home, recommend the following: Assist for transportation;Help with stairs or ramp for entrance   Can travel by private vehicle    Yes    Equipment Recommendations None recommended by PT     Functional Status Assessment Patient has had a recent decline in their functional status and demonstrates the ability to make significant improvements in function in a reasonable and predictable amount of time.     Precautions / Restrictions Precautions Precautions: Cervical Precaution Booklet Issued: Yes (comment) Recall of Precautions/Restrictions: Intact Required Braces or Orthoses:  (no brace needed order) Restrictions Weight Bearing Restrictions Per Provider Order: No      Mobility     Transfers Overall transfer level: Modified independent Equipment used: None    General transfer comment: uses B UE to  stand    Ambulation/Gait Ambulation/Gait assistance: Modified independent (Device/Increase time) Gait Distance (Feet): 400 Feet Assistive device: None Gait Pattern/deviations: Step-through pattern, Decreased weight shift to left, Drifts right/left Gait velocity: decr     General Gait Details: Limited weight-shift and knee flexion during swing phase on the left. Stiff gait pattern with slight drift to the left      Balance Overall balance assessment: Mild deficits observed, not formally tested       Pertinent Vitals/Pain Pain Assessment Pain Assessment: 0-10 Pain Score: 5  Pain Location: neck, L shoulder Pain Descriptors / Indicators: Discomfort, Guarding, Operative site guarding Pain Intervention(s): Monitored during session, Limited activity within patient's tolerance, Repositioned    Home Living Family/patient expects to be discharged to:: Private residence Living Arrangements: Spouse/significant other Available Help at Discharge: Family;Available 24 hours/day Type of Home: House Home Access: Level entry       Home Layout: One level Home Equipment: Shower seat - built in (walking stick) Additional Comments: spouse is taking off work, then sister will be present for 1 week    Prior Function Prior Level of Function : Independent/Modified Independent;Working/employed;Driving    ADLs Comments: retired from [employer]     Extremity/Trunk Assessment   Upper Extremity Assessment Upper Extremity Assessment: Defer to OT evaluation    Lower Extremity Assessment Lower Extremity Assessment: LLE deficits/detail LLE Deficits / Details: Hip flexion 4/5, Knee ext and ankle DF 5/5 LLE Sensation: WNL    Cervical / Trunk Assessment Cervical / Trunk Assessment: Neck Surgery  Communication   Communication Communication: No apparent difficulties  Cognition Arousal: Alert Behavior During Therapy: WFL for tasks assessed/performed   PT - Cognitive impairments: No apparent  impairments     Following commands: Intact       Cueing Cueing Techniques: Verbal cues     General Comments General comments (skin integrity, edema, etc.): spouse at side and supportive     PT Assessment All further PT needs can be met in the next venue of care  PT Problem List Decreased strength;Decreased activity tolerance;Decreased balance;Decreased mobility           PT Goals (Current goals can be found in the Care Plan section)  Acute Rehab PT Goals PT Goal Formulation: All assessment and education complete, DC therapy     AM-PAC PT 6 Clicks Mobility  Outcome Measure Help needed turning from your back to your side while in a flat bed without using bedrails?: None Help needed moving from lying on your back to sitting on the side of a flat bed without using bedrails?: None Help needed moving to and from a bed to a chair (including a wheelchair)?: None Help needed standing up from a chair using your arms (e.g., wheelchair or bedside chair)?: None Help needed to walk in hospital room?: None Help needed climbing 3-5 steps with a railing? : A Little 6 Click Score: 23    End of Session   Activity Tolerance: Patient tolerated treatment well Patient left: in chair;with call bell/phone within reach Nurse Communication: Mobility status PT Visit Diagnosis: Other abnormalities of gait and mobility (R26.89);Unsteadiness on feet (R26.81);Muscle weakness (generalized) (M62.81)    Time: 8587-8568 PT Time Calculation (min) (ACUTE ONLY): 19 min   Charges:   PT Evaluation $PT Eval Low Complexity: 1 Low   PT General Charges $$ ACUTE PT VISIT: 1 Visit       Kate ORN, PT, DPT Secure Chat Preferred  Rehab Office [phone]   Kate BRAVO Wendolyn 01/12/2024, 2:59 PM

## 2024-01-12 NOTE — Plan of Care (Signed)
  Problem: Clinical Measurements: Goal: Ability to maintain clinical measurements within normal limits will improve Outcome: Progressing Goal: Will remain free from infection Outcome: Progressing Goal: Diagnostic test results will improve Outcome: Progressing Goal: Respiratory complications will improve Outcome: Progressing Goal: Cardiovascular complication will be avoided Outcome: Progressing   Problem: Activity: Goal: Risk for activity intolerance will decrease Outcome: Progressing   Problem: Elimination: Goal: Will not experience complications related to bowel motility Outcome: Progressing Goal: Will not experience complications related to urinary retention Outcome: Progressing   Problem: Pain Managment: Goal: General experience of comfort will improve and/or be controlled Outcome: Progressing   Problem: Safety: Goal: Ability to remain free from injury will improve Outcome: Progressing   Problem: Skin Integrity: Goal: Risk for impaired skin integrity will decrease Outcome: Progressing

## 2024-01-12 NOTE — Plan of Care (Signed)

## 2024-01-12 NOTE — Progress Notes (Signed)
 [name] who presented with left cervical radiculopathy and myelopathy, now s/p C4-7 ACDF (9/25)  LOS: 1 day    Plan: Postop films reviewed  AAT, PTOT DAT Lovenox  on 9/27 Pain control - encouraged using oral meds instead of IV dilaudid . Scheduled tylenol . Anticipate discharge tomorrow  Subjective: Pt's left arm pain is resolved. His left hand numbness is slightly improved compared to preop. Urinating ok. Has eaten some soft foods but has an expected level of dysphagia. Has some posterior neck pain likely facet-related. He has required a few doses of dilaudid . Has passed some gas, no BM  Objective: Vital signs in last 24 hours: Temp:  [97.9 F (36.6 C)-98.6 F (37 C)] 98.2 F (36.8 C) (09/26 0409) Pulse Rate:  [73-90] 73 (09/26 0409) Resp:  [10-19] 18 (09/26 0409) BP: (126-154)/(81-95) 126/81 (09/26 0409) SpO2:  [96 %-100 %] 99 % (09/26 0409)  Intake/Output from previous day: 09/25 0701 - 09/26 0700 In: 1273 [I.V.:1253; IV Piggyback:20] Out: 3680 [Urine:3660; Blood:20] Intake/Output this shift: No intake/output data recorded.  Exam: RUE: 5/5 deltoid, 5/5 bicep, 5/5 wrist extension, 5/5 tricep, 5/5 grip LUE: 5/5 deltoid, 5/5 bicep, 5/5 wrist extension, 5/5 tricep, 5/5 grip RLE: 5/5 IP, 5/5 quad, 5/5 ham, 5/5 DF, 5/5 EHL, 5/5 PF LLE: 4/5 IP, 5/5 quad, 5/5 ham, 5/5 DF, 5/5 EHL, 5/5 PF Subjective numbness of the 2nd through 5th digits Anterior cervical incision c/d/i  Lab Results: No results for input(s): WBC, HGB, HCT, PLT in the last 72 hours. BMET No results for input(s): NA, K, CL, CO2, GLUCOSE, BUN, CREATININE, CALCIUM in the last 72 hours.  Studies/Results: DG Cervical Spine 2 or 3 views Result Date: 01/11/2024 CLINICAL DATA:  Elective surgery. EXAM: CERVICAL SPINE - 2-3 VIEW COMPARISON:  None Available. FINDINGS: Six fluoroscopic spot views of the cervical spine submitted from the operating room. Anterior fusion C4 through C7 with  interbody spacers. Fluoroscopy time 35.5 seconds. Dose 11.87 mGy IMPRESSION: Procedural fluoroscopy during anterior fusion C4 through C7. Electronically Signed   By: Andrea Gasman M.D.   On: 01/11/2024 15:26   DG C-Arm 1-60 Min-No Report Result Date: 01/11/2024 Fluoroscopy was utilized by the requesting physician.  No radiographic interpretation.   DG C-Arm 1-60 Min-No Report Result Date: 01/11/2024 Fluoroscopy was utilized by the requesting physician.  No radiographic interpretation.   DG C-Arm 1-60 Min-No Report Result Date: 01/11/2024 Fluoroscopy was utilized by the requesting physician.  No radiographic interpretation.      Cameron [name] 01/12/2024, 8:07 AM

## 2024-01-12 NOTE — TOC Transition Note (Signed)
 Transition of Care [facility]) - Discharge Note   Patient Details  Name: Cameron [name] MRN: [id number] Date of Birth: Aug 09, 1955  [facility] CM/SW Contact:  Andrez JULIANNA George, RN Phone Number: 01/12/2024, 3:04 PM   Clinical Narrative:     Pt is s/p ACDF. PT recommending outpatient therapy. Pt will follow up at MD office prior to this being arranged.  Pt has support at home as his spouse is going to take a week off from work.  Spouse can provide needed transportation. No DME needs.    Final next level of care: Home/Self Care Barriers to Discharge: No Barriers Identified   Patient Goals and CMS Choice            Discharge Placement                       Discharge Plan and Services Additional resources added to the After Visit Summary for                                       Social Drivers of Health (SDOH) Interventions SDOH Screenings   Food Insecurity: No Food Insecurity (09/01/2023)  Housing: Low Risk  (09/01/2023)  Transportation Needs: No Transportation Needs (09/01/2023)  Utilities: Not At Risk (09/01/2023)  Alcohol Screen: Low Risk  (09/01/2023)  Depression (PHQ2-9): Low Risk  (09/04/2023)  Financial Resource Strain: Low Risk  (09/01/2023)  Physical Activity: Insufficiently Active (09/01/2023)  Social Connections: Socially Integrated (09/01/2023)  Stress: No Stress Concern Present (09/01/2023)  Recent Concern: Stress - Stress Concern Present (08/09/2023)  Tobacco Use: Low Risk  (01/11/2024)  Health Literacy: Adequate Health Literacy (09/01/2023)     Readmission Risk Interventions     No data to display

## 2024-01-12 NOTE — Anesthesia Postprocedure Evaluation (Signed)
 Anesthesia Post Note  Patient: Cameron [name]  Procedure(s) Performed: ANTERIOR CERVICAL DECOMPRESSION/DISCECTOMY FUSION CERVICAL FOUR-SEVEN (Spine Cervical)     Patient location during evaluation: PACU Anesthesia Type: General Level of consciousness: awake and alert and responds to stimulation Pain management: pain level controlled Vital Signs Assessment: post-procedure vital signs reviewed and stable Respiratory status: spontaneous breathing, nonlabored ventilation, respiratory function stable and patient connected to nasal cannula oxygen Cardiovascular status: blood pressure returned to baseline and stable Postop Assessment: no apparent nausea or vomiting Anesthetic complications: no   No notable events documented.        Lynwood MARLA Cornea

## 2024-01-13 MED ORDER — ALUM & MAG HYDROXIDE-SIMETH 200-200-20 MG/5ML PO SUSP
30.0000 mL | Freq: Four times a day (QID) | ORAL | Status: DC | PRN
  Administered 2024-01-13: 30 mL via ORAL
  Filled 2024-01-13: qty 30

## 2024-01-13 MED ORDER — OXYCODONE-ACETAMINOPHEN 5-325 MG PO TABS: 1.0000 | ORAL_TABLET | ORAL | 0 refills | Status: DC | PRN

## 2024-01-13 MED ORDER — CYCLOBENZAPRINE HCL 10 MG PO TABS: 10.0000 mg | ORAL_TABLET | Freq: Every day | ORAL | 3 refills | Status: AC

## 2024-01-13 NOTE — Discharge Summary (Signed)
 Physician Discharge Summary  Patient ID: Cameron [name] MRN: [id number] DOB/AGE: 67-Oct-1957 67 y.o.  Admit date: 01/11/2024 Discharge date: 01/13/2024  Admission Diagnoses:  Myelopathy, radiculopathy   Discharge Diagnoses: same   Discharged Condition: good  Hospital Course: The patient was admitted on 01/11/2024 and taken to the operating room where the patient underwent acdf C4-7. The patient tolerated the procedure well and was taken to the recovery room and then to the floor in stable condition. The hospital course was routine. There were no complications. The wound remained clean dry and intact. Pt had appropriate neck soreness. No complaints of arm pain or new N/T/W. The patient remained afebrile with stable vital signs, and tolerated a regular diet. The patient continued to increase activities, and pain was well controlled with oral pain medications.   Consults: None  Significant Diagnostic Studies:  Results for orders placed or performed during the hospital encounter of 01/11/24  ABO/Rh   Collection Time: 01/11/24  6:15 AM  Result Value Ref Range   ABO/RH(D)      O POS Performed at [facility], [address] [place], [place] [address]     DG Cervical Spine 2 or 3 views Result Date: 01/12/2024 CLINICAL DATA:  Status post anterior cervical discectomy and fusion EXAM: CERVICAL SPINE - 2 VIEW COMPARISON:  Intraoperative cervical spine radiographs dated 01/11/2024 FINDINGS: Postsurgical changes of C4-7 anterior cervical discectomy and fusion. Hardware appear intact and well seated. Alignment is normal. Overlying subcutaneous soft tissue emphysema along the right neck. IMPRESSION: Postsurgical changes of C4-7 anterior cervical discectomy and fusion. No hardware complication. Electronically Signed   By: Limin  Xu M.D.   On: 01/12/2024 14:51   DG Cervical Spine 2 or 3 views Result Date: 01/11/2024 CLINICAL DATA:  Elective surgery. EXAM: CERVICAL SPINE - 2-3 VIEW COMPARISON:   None Available. FINDINGS: Six fluoroscopic spot views of the cervical spine submitted from the operating room. Anterior fusion C4 through C7 with interbody spacers. Fluoroscopy time 35.5 seconds. Dose 11.87 mGy IMPRESSION: Procedural fluoroscopy during anterior fusion C4 through C7. Electronically Signed   By: Andrea Gasman M.D.   On: 01/11/2024 15:26   DG C-Arm 1-60 Min-No Report Result Date: 01/11/2024 Fluoroscopy was utilized by the requesting physician.  No radiographic interpretation.   DG C-Arm 1-60 Min-No Report Result Date: 01/11/2024 Fluoroscopy was utilized by the requesting physician.  No radiographic interpretation.   DG C-Arm 1-60 Min-No Report Result Date: 01/11/2024 Fluoroscopy was utilized by the requesting physician.  No radiographic interpretation.    Antibiotics:  Anti-infectives (From admission, onward)    Start     Dose/Rate Route Frequency Ordered Stop   01/11/24 1800  ceFAZolin  (ANCEF ) IVPB 2g/100 mL premix        2 g 200 mL/hr over 30 Minutes Intravenous Every 6 hours 01/11/24 1435 01/12/24 0035   01/11/24 0600  vancomycin  (VANCOCIN ) IVPB 1000 mg/200 mL premix        1,000 mg 200 mL/hr over 60 Minutes Intravenous On call to O.R. 01/11/24 0544 01/11/24 9277       Discharge Exam: Blood pressure 115/76, pulse 75, temperature 97.8 F (36.6 C), temperature source Oral, resp. rate 17, height 5' 10 (1.778 m), weight 81.6 kg, SpO2 97%. Neurologic: Grossly normal Ambulating and voiding well incision cdi   Discharge Medications:   Allergies as of 01/13/2024       Reactions   Citalopram Hydrobromide Itching, Nausea Only   Penicillins Other (See Comments)   REACTION: questionable, Childhood  reaction        Medication List     STOP taking these medications    HYDROcodone -acetaminophen  7.5-325 MG tablet Commonly known as: NORCO   ibuprofen  200 MG tablet Commonly known as: ADVIL        TAKE these medications    ALPRAZolam  0.5 MG tablet Commonly  known as: XANAX  Take 2 tablets thirty minutes prior to MRI.  May take one additional tablet before entering scanner, if needed.  MUST HAVE DRIVER.   Armodafinil  200 MG Tabs Take 1 tablet (200 mg total) by mouth in the morning.   chlorhexidine  4 % external liquid Commonly known as: HIBICLENS  Apply 15 mLs (1 Application total) topically as directed for 30 doses. Use as directed daily for 5 days every other week for 6 weeks.   cyclobenzaprine  10 MG tablet Commonly known as: FLEXERIL  Take 1 tablet (10 mg total) by mouth at bedtime.   DIALYVITE VITAMIN D  5000 PO Take 5,000 Units by mouth daily.   fluticasone  50 MCG/ACT nasal spray Commonly known as: FLONASE  Place 2 sprays into both nostrils daily. In each nostril   gabapentin  300 MG capsule Commonly known as: NEURONTIN  One po tid What changed:  how much to take how to take this when to take this additional instructions   gabapentin  100 MG capsule Commonly known as: NEURONTIN  TAKE 1 CAPSULE BY MOUTH EVERY MORNING AND TAKE TWO CAPSULES BY MOUTH EVERY NIGHT AT BEDTIME What changed: See the new instructions.   hydrocortisone  25 MG suppository Commonly known as: Anucort-HC  UNWRAP AND INSERT 1 SUPPOSITORY INTO THE RECTUM THREE TIMES A DAY AS NEEDED   leflunomide  20 MG tablet Commonly known as: ARAVA  TAKE 1 TABLET BY MOUTH DAILY   methylphenidate  20 MG tablet Commonly known as: Ritalin  Take one po AM and one po at noon   methylPREDNISolone  4 MG tablet Commonly known as: MEDROL  Taper from 6 pills po for one day to 1 pill po the last day over 6 days   mupirocin  ointment 2 % Commonly known as: BACTROBAN  Place 1 Application into the nose 2 (two) times daily for 60 doses. Use as directed 2 times daily for 5 days every other week for 6 weeks.   omeprazole 20 MG capsule Commonly known as: PRILOSEC Take 20 mg by mouth daily.   oxyCODONE -acetaminophen  5-325 MG tablet Commonly known as: Percocet Take 1 tablet by mouth every 4  (four) hours as needed for severe pain (pain score 7-10).        Disposition: home   Final Dx: acdf C4-7  Discharge Instructions      Remove dressing in 72 hours   Complete by: As directed    Call MD for:   Complete by: As directed    Call MD for:  difficulty breathing, headache or visual disturbances   Complete by: As directed    Call MD for:  hives   Complete by: As directed    Call MD for:  persistant dizziness or light-headedness   Complete by: As directed    Call MD for:  persistant nausea and vomiting   Complete by: As directed    Call MD for:  redness, tenderness, or signs of infection (pain, swelling, redness, odor or green/yellow discharge around incision site)   Complete by: As directed    Call MD for:  severe uncontrolled pain   Complete by: As directed    Call MD for:  temperature >100.4   Complete by: As directed    Diet -  low sodium heart healthy   Complete by: As directed    Driving Restrictions   Complete by: As directed    No driving for 2 weeks, no riding in the car for 1 week   Incentive spirometry RT   Complete by: As directed    Increase activity slowly   Complete by: As directed    Lifting restrictions   Complete by: As directed    No lifting more than 8 lbs        Follow-up Information     Darnella Dorn SAUNDERS, MD Follow up.   Specialty: Neurosurgery Contact information: [address] [place], [address] [place] [address] [phone]                  Signed: Suzen Lacks [facility] 01/13/2024, 7:25 AM

## 2024-01-13 NOTE — Plan of Care (Signed)
  Problem: Health Behavior/Discharge Planning: Goal: Ability to manage health-related needs will improve Outcome: Progressing   Problem: Clinical Measurements: Goal: Ability to maintain clinical measurements within normal limits will improve Outcome: Progressing Goal: Will remain free from infection Outcome: Progressing Goal: Diagnostic test results will improve Outcome: Progressing Goal: Respiratory complications will improve Outcome: Progressing Goal: Cardiovascular complication will be avoided Outcome: Progressing   Problem: Activity: Goal: Risk for activity intolerance will decrease Outcome: Progressing   Problem: Elimination: Goal: Will not experience complications related to bowel motility Outcome: Progressing Goal: Will not experience complications related to urinary retention Outcome: Progressing   Problem: Pain Managment: Goal: General experience of comfort will improve and/or be controlled Outcome: Progressing   Problem: Skin Integrity: Goal: Risk for impaired skin integrity will decrease Outcome: Progressing   Problem: Safety: Goal: Ability to remain free from injury will improve Outcome: Progressing

## 2024-01-18 ENCOUNTER — Encounter: Payer: Self-pay | Admitting: Neurology

## 2024-01-31 ENCOUNTER — Encounter: Payer: Self-pay | Admitting: Neurology

## 2024-02-12 ENCOUNTER — Other Ambulatory Visit: Payer: Self-pay | Admitting: Neurology

## 2024-02-12 MED ORDER — METHYLPHENIDATE HCL 20 MG PO TABS: ORAL_TABLET | ORAL | 0 refills | Status: DC

## 2024-02-12 NOTE — Telephone Encounter (Signed)
 Patient request refill for methylphenidate  (RITALIN ) 20 MG tablet send to  [facility] [id number]

## 2024-02-12 NOTE — Telephone Encounter (Signed)
 Last seen on 12/11/24 Follow up scheduled on 07/16/24  Dispensed Days Supply Quantity Provider Pharmacy  methylphenidate  20 mg tablet 01/01/2024 30 60 tablet Sater, Charlie LABOR, MD [facility]...     Rx pending to be signed

## 2024-02-15 ENCOUNTER — Telehealth: Payer: Self-pay

## 2024-02-15 NOTE — Telephone Encounter (Signed)
 Copied from CRM #[id number]. Topic: Clinical - Medication Question >> Feb 14, 2024  3:39 PM Viola F wrote: Reason for CRM:  Patient called regarding the hydrocortisone  (ANUCORT-HC ) 25 MG suppository - he needs a new script of the CREAM version because the current prescription is too expensive. Please send to [id number] [place], [place] - [address]  Patient has TOC appointment with Dr. Bennett 09/04/24

## 2024-02-16 ENCOUNTER — Other Ambulatory Visit: Payer: Self-pay | Admitting: Family

## 2024-02-16 MED ORDER — HYDROCORTISONE (PERIANAL) 2.5 % EX CREA: 1.0000 | TOPICAL_CREAM | Freq: Two times a day (BID) | CUTANEOUS | 0 refills | Status: AC

## 2024-02-27 ENCOUNTER — Other Ambulatory Visit: Payer: Self-pay | Admitting: Neurology

## 2024-02-28 NOTE — Telephone Encounter (Signed)
 Last seen on 12/12/23 Follow up scheduled on 07/16/24

## 2024-03-11 ENCOUNTER — Other Ambulatory Visit: Payer: Self-pay | Admitting: Neurology

## 2024-03-11 MED ORDER — ARMODAFINIL 200 MG PO TABS: 1.0000 | ORAL_TABLET | Freq: Every morning | ORAL | 5 refills | Status: AC

## 2024-03-11 NOTE — Telephone Encounter (Signed)
 Pt is requesting a refill for Armodafinil  200 MG TABS .  Pharmacy: [facility] [id number]

## 2024-03-11 NOTE — Telephone Encounter (Signed)
 Requested Prescriptions   Pending Prescriptions Disp Refills   Armodafinil  200 MG TABS 30 tablet 5    Sig: Take 1 tablet (200 mg total) by mouth in the morning.   Last seen 12/12/23 Next appt 07/16/24 Armodafinil  Adherence From 09/12/2023 to 03/09/2024   Proportion of Days Covered  60  80  0% of total days covered (0/177 days)    High Confidence   Fill data for this medication is likely complete. Other factors may still affect the accuracy of the score.       About this Score  Dispenses  No dispenses within 180 days of the adherence period (since 03/16/2023)   How do dispenses affect the score?

## 2024-03-20 ENCOUNTER — Other Ambulatory Visit: Payer: Self-pay | Admitting: Neurology

## 2024-03-20 MED ORDER — METHYLPHENIDATE HCL 20 MG PO TABS: ORAL_TABLET | ORAL | 0 refills | Status: DC

## 2024-03-20 NOTE — Telephone Encounter (Signed)
 Pt called to request medication refill  methylphenidate  (RITALIN ) 20 MG tablet   Pt medication is to be sent   [id number] [place], [place] - [address] (Ph: [phone])

## 2024-03-20 NOTE — Telephone Encounter (Signed)
 Requested Prescriptions   Pending Prescriptions Disp Refills   methylphenidate  (RITALIN ) 20 MG tablet 60 tablet 0    Sig: Take one po AM and one po at noon   Last seen 12/12/23 Next appt 07/16/24  Dispenses   Dispensed Days Supply Quantity Provider Pharmacy  methylphenidate  20 mg tablet 02/12/2024 30 60 tablet Sater, Charlie LABOR, MD [facility]...  methylphenidate  20 mg tablet 01/01/2024 30 60 tablet Sater, Charlie LABOR, MD [facility]...  methylphenidate  20 mg tablet 11/27/2023 30 60 tablet Sater, Charlie LABOR, MD [facility]...  methylphenidate  20 mg tablet 10/26/2023 30 60 tablet Sater, Charlie LABOR, MD [facility]...  methylphenidate  20 mg tablet 09/22/2023 30 60 tablet Onita Duos, MD [facility]...  methylphenidate  20 mg tablet 08/21/2023 30 60 tablet Sater, Charlie LABOR, MD [facility]...  methylphenidate  20 mg tablet 07/19/2023 30 60 tablet Sater, Charlie LABOR, MD [facility]...  methylphenidate  20 mg tablet 06/19/2023 30 60 tablet Buck Saucer, MD [facility]...  methylphenidate  20 mg tablet 05/18/2023 30 60 tablet Sater, Charlie LABOR, MD [facility]...  methylphenidate  20 mg tablet 04/15/2023 30 60 tablet Sater, Charlie LABOR, MD [facility]...

## 2024-03-26 ENCOUNTER — Other Ambulatory Visit: Payer: Self-pay | Admitting: Neurology

## 2024-03-26 DIAGNOSIS — G35D Multiple sclerosis, unspecified: Secondary | ICD-10-CM

## 2024-04-22 ENCOUNTER — Ambulatory Visit: Payer: Self-pay

## 2024-04-22 NOTE — Telephone Encounter (Signed)
 Noted. Will evaluate in office

## 2024-04-22 NOTE — Telephone Encounter (Signed)
 Pt states that he is having increased sputum d/t his acid reflux, pt states that he has been using OTC medications but they are not helping. Pt states that he would like to try protonix . Pt states he was treated for hpylori. Denies CP, denies SOB. Denies N/V.   Copied from CRM [id number]. Topic: Clinical - Red Word Triage >> Apr 22, 2024 11:28 AM Frederich PARAS wrote: [facility] that prompted transfer to Nurse Triage: DIGESTIVE ISSUES  PT CALLED TO SCHEDULE WITH DR. MATT CABLE, WAS DENIED SCHEDULING DUE TO WORSENING DIGESTIVE , STOMACH , REFLEX MEDICCINE NO GOTTEN BETTER,  CONSTANT REFLEX, MUCUS IN THROAT COMING FROM STOMACH LINNING, CONSANT CLEARING OF THROAT, ONGOING BATHROOM FREQUENTLY.PT would like a TOC done to be with [employer]

## 2024-04-25 ENCOUNTER — Ambulatory Visit: Admitting: Nurse Practitioner

## 2024-04-25 VITALS — BP 124/90 | HR 88 | Temp 98.1°F | Ht 70.0 in | Wt 182.0 lb

## 2024-04-25 DIAGNOSIS — K219 Gastro-esophageal reflux disease without esophagitis: Secondary | ICD-10-CM | POA: Diagnosis not present

## 2024-04-25 MED ORDER — PANTOPRAZOLE SODIUM 40 MG PO TBEC: 40.0000 mg | DELAYED_RELEASE_TABLET | Freq: Every day | ORAL | 0 refills | Status: DC

## 2024-04-25 NOTE — Patient Instructions (Signed)
 Nice to see you today We will discontinue the omeprazole and start the pantoprazole  (Protonix ) 40mg  daily for 30 days Keep your appt with me as scheduled

## 2024-04-25 NOTE — Progress Notes (Signed)
 "  Acute Office Visit  Subjective:     Patient ID: Cameron [name], male    DOB: 06-21-1955, 69 y.o.   MRN: [id number]  Chief Complaint  Patient presents with   Gastroesophageal Reflux    Pt complains of ongoing reflux that makes it difficult for him to speak. States he has been treating it for 15 years OTC. The last 6 months pt complains of mucus that gets stuck (thick and crusty) pt states constant swallowing to clear passageway.      Patient is in today for reflux with a history of allergic rhinitis, h. Pylori, GERD, MS, kidney stone, and ADD  Discussed the use of AI scribe software for clinical note transcription with the patient, who gave verbal consent to proceed.  History of Present Illness Cameron [name] is a 69 year old male with reflux who presents with exacerbation of reflux symptoms.  He has experienced reflux symptoms for approximately 15 years, trying various over-the-counter medications such as Prevacid, Nexium, and Prilosec. Over the past eight months, his symptoms have worsened, characterized by persistent thick mucus on the vocal folds, described as 'like I've got laryngitis'. This mucus is present throughout the day and night, even when lying in an elevated position, and is not relieved by swallowing or other measures.  Last summer, he underwent an endoscopy, which led to a diagnosis of H. pylori. Treatment for H. pylori provided relief for a couple of months, but symptoms have since returned. No burning sensation, belly pain, nausea, or vomiting, but he notes a decline in appetite, which he attributes partly to his age and multiple sclerosis.  His current medications include Armodafinil , gabapentin  (300 mg), leflunomide , Ritalin , omeprazole, alprazolam  (as needed for MRIs), Flonase  (seasonally), and hydrocodone  (as needed for breakthrough pain). He has been using magnesium citrate gummies, which provide some relief for neuropathy but also cause diarrhea.  He has a  history of multiple sclerosis and is under the care of a neurologist. He underwent a triple fusion neck surgery in September, which he initially thought might be related to his reflux symptoms. He also reports regular bowel movements and no shortness of breath.   Review of Systems  Constitutional:  Negative for chills and fever.  Respiratory:  Negative for cough and shortness of breath.   Gastrointestinal:  Negative for abdominal pain, constipation, diarrhea, nausea and vomiting.       BM daily         Objective:    BP (!) 124/90   Pulse 88   Temp 98.1 F (36.7 C) (Oral)   Ht 5' 10 (1.778 m)   Wt 182 lb (82.6 kg)   SpO2 99%   BMI 26.11 kg/m  BP Readings from Last 3 Encounters:  04/25/24 (!) 124/90  01/13/24 124/78  01/08/24 (!) 144/99   Wt Readings from Last 3 Encounters:  04/25/24 182 lb (82.6 kg)  01/11/24 180 lb (81.6 kg)  01/08/24 180 lb 11.2 oz (82 kg)   SpO2 Readings from Last 3 Encounters:  04/25/24 99%  01/13/24 99%  01/08/24 100%      Physical Exam Vitals and nursing note reviewed.  Constitutional:      Appearance: Normal appearance.  Cardiovascular:     Rate and Rhythm: Normal rate and regular rhythm.     Heart sounds: Normal heart sounds.  Pulmonary:     Effort: Pulmonary effort is normal.     Breath sounds: Normal breath sounds.  Abdominal:  General: Bowel sounds are normal. There is no distension.     Palpations: There is no mass.     Tenderness: There is no abdominal tenderness.     Hernia: No hernia is present.  Neurological:     Mental Status: He is alert.     No results found for any visits on 04/25/24.      Assessment & Plan:   Problem List Items Addressed This Visit       Digestive   GERD (gastroesophageal reflux disease) - Primary   Relevant Medications   pantoprazole  (PROTONIX ) 40 MG tablet    Meds ordered this encounter  Medications   pantoprazole  (PROTONIX ) 40 MG tablet    Sig: Take 1 tablet (40 mg total) by  mouth daily.    Dispense:  30 tablet    Refill:  0    Supervising Provider:   RANDEEN HARDY A [[id number]]   Assessment and Plan Assessment & Plan Gastroesophageal reflux disease Chronic GERD with recent exacerbation. Previous treatments ineffective. Recent endoscopy showed H. pylori, treated but symptoms recurred. Current management focuses on GERD treatment. - Prescribed pantoprazole  40 mg daily for 30 days, then reduce to 20 mg daily. - Consider alternative treatments such as Dexilant or Voquezna if symptoms persist. - Discussed potential side effects of long-term PPI use, including absorption issues with magnesium, vitamin D , and B12. - Follow up in 3 weeks to assess response to treatment.   Return if symptoms worsen or fail to improve, for as scheduled .  Adina Crandall, NP   "

## 2024-04-29 ENCOUNTER — Other Ambulatory Visit: Payer: Self-pay | Admitting: Neurology

## 2024-04-29 MED ORDER — METHYLPHENIDATE HCL 20 MG PO TABS: ORAL_TABLET | ORAL | 0 refills | Status: AC

## 2024-04-29 NOTE — Telephone Encounter (Signed)
 Requested Prescriptions   Pending Prescriptions Disp Refills   methylphenidate  (RITALIN ) 20 MG tablet 60 tablet 0    Sig: Take one po AM and one po at noon   Last seen 12/12/23 Next appt 07/16/24  Dispenses   Dispensed Days Supply Quantity Provider Pharmacy  methylphenidate  20 mg tablet 03/20/2024 30 60 tablet Sater, Charlie LABOR, MD [facility]...  methylphenidate  20 mg tablet 02/12/2024 30 60 tablet Sater, Charlie LABOR, MD [facility]...  methylphenidate  20 mg tablet 01/01/2024 30 60 tablet Sater, Charlie LABOR, MD [facility]...  methylphenidate  20 mg tablet 11/27/2023 30 60 tablet Sater, Charlie LABOR, MD [facility]...  methylphenidate  20 mg tablet 10/26/2023 30 60 tablet Sater, Charlie LABOR, MD [facility]...  methylphenidate  20 mg tablet 09/22/2023 30 60 tablet Onita Duos, MD [facility]...  methylphenidate  20 mg tablet 08/21/2023 30 60 tablet Sater, Charlie LABOR, MD [facility]...  methylphenidate  20 mg tablet 07/19/2023 30 60 tablet Sater, Charlie LABOR, MD [facility]...  methylphenidate  20 mg tablet 06/19/2023 30 60 tablet Buck Saucer, MD [facility]...  methylphenidate  20 mg tablet 05/18/2023 30 60 tablet Sater, Charlie LABOR, MD [facility]...

## 2024-04-29 NOTE — Telephone Encounter (Signed)
 Pt called to request medication refill  methylphenidate  (RITALIN ) 20 MG tablet  Pt medication is to be sent to     [facility] [id number], [place] - [address] (Ph: [phone])

## 2024-05-10 ENCOUNTER — Ambulatory Visit (HOSPITAL_BASED_OUTPATIENT_CLINIC_OR_DEPARTMENT_OTHER): Admitting: Physical Therapy

## 2024-05-10 ENCOUNTER — Encounter (HOSPITAL_BASED_OUTPATIENT_CLINIC_OR_DEPARTMENT_OTHER): Payer: Self-pay | Admitting: Physical Therapy

## 2024-05-10 ENCOUNTER — Other Ambulatory Visit: Payer: Self-pay

## 2024-05-10 DIAGNOSIS — G959 Disease of spinal cord, unspecified: Secondary | ICD-10-CM | POA: Insufficient documentation

## 2024-05-10 DIAGNOSIS — M542 Cervicalgia: Secondary | ICD-10-CM | POA: Diagnosis not present

## 2024-05-10 DIAGNOSIS — M5459 Other low back pain: Secondary | ICD-10-CM | POA: Diagnosis not present

## 2024-05-10 DIAGNOSIS — R2689 Other abnormalities of gait and mobility: Secondary | ICD-10-CM | POA: Diagnosis not present

## 2024-05-10 DIAGNOSIS — M6281 Muscle weakness (generalized): Secondary | ICD-10-CM | POA: Insufficient documentation

## 2024-05-10 NOTE — Therapy (Signed)
 " OUTPATIENT PHYSICAL THERAPY THORACOLUMBAR EVALUATION   Patient Name: Cameron [name] MRN: [id number] DOB:1955/11/08, 69 y.o., male Today's Date: 05/10/2024  END OF SESSION:  PT End of Session - 05/10/24 1037     Visit Number 1    Date for Recertification  07/19/24    Authorization Type [employer]    PT Start Time [phone]    PT Stop Time 0930    PT Time Calculation (min) 45 min    Activity Tolerance Patient tolerated treatment well    Behavior During Therapy [facility] for tasks assessed/performed          Past Medical History:  Diagnosis Date   Abnormal MRI 2007   nonspecific white matter abnormalities   Allergy    seasonal   Anxiety    no meds   Arthritis    spine   Cataract    GERD (gastroesophageal reflux disease)    Helicobacter pylori gastritis 07/20/2023        History of kidney stones    x 2 episodes- last 2020   Hx of adenomatous colonic polyps 09/02/2021   2 diminutive adenomas recall 2030   Idiopathic scoliosis    Migraines    OCULAR connected to MS   Multiple sclerosis    Neuromuscular disorder (HCC)    MS - mild left side spasicity   PONV (postoperative nausea and vomiting)    Vertigo    1 episode.  approx 12/2021.  Lasted 2-3 days.   Past Surgical History:  Procedure Laterality Date   ANTERIOR CERVICAL DECOMP/DISCECTOMY FUSION N/A 01/11/2024   Procedure: ANTERIOR CERVICAL DECOMPRESSION/DISCECTOMY FUSION CERVICAL FOUR-SEVEN;  Surgeon: Darnella Dorn SAUNDERS, MD;  Location: [facility] OR;  Service: Neurosurgery;  Laterality: N/A;   CATARACT EXTRACTION W/PHACO Right 06/28/2023   Procedure: CATARACT EXTRACTION PHACO AND INTRAOCULAR LENS PLACEMENT (IOC) RIGHT  6.17  00:42.8;  Surgeon: Mittie Gaskin, MD;  Location: [facility];  Service: Ophthalmology;  Laterality: Right;   COLONOSCOPY  06/27/2007   Normal   CYSTOSCOPY/URETEROSCOPY/HOLMIUM LASER/STENT PLACEMENT Right 07/27/2018   Procedure: CYSTOSCOPY/URETEROSCOPY/HOLMIUM LASER/STENT PLACEMENT;  Surgeon:  Twylla Glendia BROCKS, MD;  Location: [facility] ORS;  Service: Urology;  Laterality: Right;   LUMBAR DISC SURGERY     TONSILLECTOMY     URETEROLITHOTOMY  11/16/2001   US  ECHOCARDIOGRAPHY  11/16/2004   normal, Carotids normal   Patient Active Problem List   Diagnosis Date Noted   Elective surgery 01/11/2024   Tendonitis of shoulder 08/10/2023   Anterior shin splints 08/10/2023   Helicobacter pylori gastritis 07/20/2023   Early satiety 06/06/2023   Prostate hypertrophy 07/12/2022   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   Hx of adenomatous colonic polyps 09/02/2021   Right optic neuritis 02/04/2021   Lumbar radiculopathy 06/25/2020   Lumbar disc herniation 05/01/2020   High risk medication use 11/04/2019   Vitamin D  deficiency 11/04/2019   Left foot drop 11/04/2019   Abnormal MRI of head 09/24/2019   Ureteral calculus 07/25/2018   Hydronephrosis with urinary obstruction due to ureteral calculus 07/25/2018   Sensorineural hearing loss (SNHL), bilateral 03/06/2017   Low back pain 05/29/2014   Routine general medical examination at a health care facility 02/23/2012   GERD (gastroesophageal reflux disease)    Hyperlipemia 10/03/2007   Seasonal allergic rhinitis due to pollen 07/19/2006   RENAL CALCULUS 07/19/2006   Multiple sclerosis 10/16/2005    PCP: Charlie Crete  REFERRING PROVIDER: Darnella Dorn SAUNDERS, MD   REFERRING DIAG: G95.9 (ICD-10-CM) - Disease of spinal  cord, unspecified   Rationale for Evaluation and Treatment: Rehabilitation  THERAPY DIAG:  Muscle weakness (generalized)  Other abnormalities of gait and mobility  Other low back pain  Cervicalgia  ONSET DATE: 2020  SUBJECTIVE:                                                                                                                                                                                           SUBJECTIVE STATEMENT: I have MS had been working on my left side weakness last  May at [place].  Had a triple cervical fusion in sept 2025.  This is my post surgical PT. Like the water it helps my MS (secondary progressive). Hx of LBP back to 2021. Today having some neck and shoulder issues in right neck and shoulder. I have some spastity in my lle in quad and some times have left foot drop usually comes on when I am fatigued but unpredictable. Haven't fallen in a while  PERTINENT HISTORY:  L2-L3 ablation, Lumbar spine discectomy, MS  Spondylosis without myelopathy or radiculopathy, lumbar region  MS x 4 years  PAIN:  Are you having pain? Yes: NPRS scale: current 2-3/10;  Pain location: LB; left shoulder and c-spine; Pain description: ache Aggravating factors: standing still, manual activity vacuuming; working in yard Relieving factors: lidocaine , meds, hot packs; positioning  PRECAUTIONS: None   RED FLAGS: None      WEIGHT BEARING RESTRICTIONS: No   FALLS:  Has patient fallen in last 6 months? No   LIVING ENVIRONMENT: Lives with: lives with their family, husband Lives in: House/apartment Stairs: No Has following equipment at home: None   OCCUPATION: Programmer, Multimedia   PLOF: Independent  PATIENT GOALS: stamina, flexibility and muscle strength; find my way through the gym without doing something wrong  NEXT MD VISIT: next month  OBJECTIVE:  Note: Objective measures were completed at Evaluation unless otherwise noted.   DIAGNOSTIC FINDINGS:  MRI c-spine 9/25 Status post anterior cervical discectomy and fusion  IMPRESSION: Postsurgical changes of C4-7 anterior cervical discectomy and fusion. No hardware complication.  MRI lumbar spine 2022 MPRESSION: This MRI of the lumbar spine without contrast shows the following: 1.   At L2-L3, there is a large left paramedian herniated disc filling the left lateral recess and causing left L3 nerve root compression.  Additionally there is edema within the endplates surrounding the disc.  These changes have occurred  since the 2014 MRI. 2.   At L1-L2, there is spondylolisthesis and other degenerative changes that has progressed since 2014.  There is moderate left lateral recess stenosis but no definite nerve root compression. 3.   At  L3-L4, there is spondylolisthesis and other degenerative change that has progressed compared to the 2014 MRI.  There is borderline spinal stenosis but no nerve root compression 4.   At L4-L5, there is mild spinal stenosis and other degenerative change that has progressed since 2014.  There is no nerve root compression. 5.   At L5-S1, there were all mild stable degenerative changes.  No nerve root compression or spinal stenosis.  PATIENT SURVEYS:  ODI:16/50  COGNITION: Overall cognitive status: Within functional limits for tasks assessed     SENSATION: WFL  MUSCLE LENGTH: Hamstrings: tight L>R   POSTURE:  Pt reports being dx with scoliosis which appears to be a Dextroscoliosis (unable to find in chart)  PALPATION: Slight TTP throughout left sided cervical pain and shoulder   UE ROM wfl UE strength general: right wfl;  Left Pt reports left sided weakness incoordination and reduced fine motor coordination due to MS  LUMBAR ROM:   wfl  LOWER EXTREMITY ROM:     wfl LOWER EXTREMITY MMT:    MMT Right eval Left eval  Hip flexion 32.3 9.4  Hip extension    Hip abduction 29.7 22.4  Hip adduction    Hip internal rotation    Hip external rotation    Knee flexion    Knee extension 47.5 35.4  Ankle dorsiflexion    Ankle plantarflexion    Ankle inversion    Ankle eversion     (Blank rows = not tested)  LUMBAR SPECIAL TESTS:  Slump test: Negative  FUNCTIONAL TESTS:  5 times sit to stand: 14.32 Timed up and go (TUG): 11.69 4 stage balance; Passed 1&2;  tandem: needs assistance to gain position able to hold 7s; SLS unable  Item Test date: 05/10/24 Date:  Date:   Sitting to standing 3. able to stand independently using hands Insert SmartPhrase OPRCBERGREEVAL  Insert SmartPhrase OPRCBERGREEVAL  2. Standing unsupported 4. able to stand safely for 2 minutes    3. Sitting with back unsupported, feet supported 4. able to sit safely and securely for 2 minutes    4. Standing to sitting 3. controls descent by using hands    5. Pivot transfer  3. able to transfer safely with definite need of hands    6. Standing unsupported with eyes closed 3. able to stand 10 seconds with supervision    7. Standing unsupported with feet together 4. able to place feet together independently and stand 1 minute safely    8. Reaching forward with outstretched arms while standing 3. can reach forward 12 cm (5 inches)    9. Pick up object from the floor from standing 4. able to pick up slipper safely and easily    10. Turning to look behind over left and right shoulders while standing 3. looks behind one side only, other side shows less weight shift    11. Turn 360 degrees 3. able to turn 360 degrees safely, one side only, in 4 seconds or less    12. Place alternate foot on step or stool while standing unsupported 1. able to complete > 2 steps needs minimal assist    13. Standing unsupported one foot in front 0. loses balance while stepping or standing    14. Standing on one leg 0. unable to try of needs assist to prevent fall      Total Score 38/56 Total Score:    Total Score:       GAIT: Distance walked: 500 ft Assistive device utilized:  None Level of assistance: Complete Independence Comments: extended stance time rle, reduced left hip and knee flex during swing.  Pt reports some spasticity throughout left quad when fatigued  TREATMENT  Eval Self care:Posture and body mechanic instruction; modifying and dosing exercise (walking/HEP); importance of resting and being mindful of fatigue level vs activity level needed for remainder of day.                                                                                                                              PATIENT  EDUCATION:  Education details: Discussed eval findings, rehab rationale, aquatic program progression/POC and pools in area. Patient is in agreement  Person educated: Patient Education method: Explanation Education comprehension: verbalized understanding  HOME EXERCISE PROGRAM: Prior episode 5/25 land based Access Code: [email or web address]   ASSESSMENT:  CLINICAL IMPRESSION: Patient is a 69 y.o. m who was seen today for physical therapy evaluation and treatment for disease of spinal cord with main complaint of weakness and fatigue. He has multiple contributing dx of MS, lumbar OA with spondylolisthesis/stenosis, and cervical fusion. Weakness primarily left sided with some spasticity noted in Lt hip flex when fatigued. He has pain sensitivity in all areas of problem which is variable day to day. He had been in PT last year up until May (2025) when he had increased symptoms from cervical spine dysfunction and pause for surgery.  He presents today with pain and MS driven deficits in strength, endurance, activity tolerance gait, balance, and functional mobility with ADL's. Patient is having to modify and restrict ADL's as indicated by outcome measure score as well as subjective information   Patient presents with pain limited deficits in  (area)        strength, ROM, endurance, activity tolerance,  and objective measures which is affecting overall participation. Patient will benefit from skilled physical therapy in order to improve function and reduce impairment.     OBJECTIVE IMPAIRMENTS: Abnormal gait, decreased activity tolerance, decreased balance, decreased coordination, decreased endurance, decreased mobility, difficulty walking, decreased strength, impaired tone, postural dysfunction, and pain.   ACTIVITY LIMITATIONS: carrying, lifting, bending, sitting, standing, squatting, stairs, transfers, and locomotion level  PARTICIPATION LIMITATIONS: meal prep, cleaning, laundry, shopping, community  activity, occupation, and yard work   [facility] POTENTIAL: Good  CLINICAL DECISION MAKING: Evolving/moderate complexity  EVALUATION COMPLEXITY: Moderate   GOALS: Goals reviewed with patient? No  SHORT TERM GOALS: Target date: 06/02/24   Pt will tolerate full aquatic sessions consistently without increase in pain and with improving function to demonstrate good toleration and effectiveness of intervention.  Baseline: Goal status: INITIAL  2.  Pt will be compliant and indep with initial HEP reporting good tolerance Baseline:  Goal status: INITIAL    LONG TERM GOALS: Target date: 07/19/24  Pt to improve on ODI to 19% to demonstrate statistically significant Improvement in function. (MCID 13-15%) Baseline: 16/50=32% Goal status: INITIAL  2.  Pt will improve on Berg balance test  to >45/= /56 to demonstrate a decrease in fall risk. (MDC 5) Baseline: 38/56 Goal status: INITIAL  3.  Pt will be indep and confident in completion of final HEP's to manage chronic conditions Baseline:  Goal status: INITIAL  4.  Pt will improve strength in Left hip to within 10lbs of contralateral side to demonstrate improved overall physical function and reduce fall risk Baseline:  Goal status: INITIAL  5.  Pt will report decrease in pain by at least 50% for improved toleration to activity/quality of life and to demonstrate improved management of pain. Baseline:  Goal status: INITIAL  6.  Pt will improve on 5 X STS test to <or=12s to demonstrate improving functional lower extremity strength, transitional movements, and balance. (Age 50-79 avg 12.6)  Baseline: 14.32 Goal status: INITIAL  PLAN:  PT FREQUENCY: 1-2x/week  PT DURATION: 10 weeks extended out due to scheduling conflicts  PLANNED INTERVENTIONS: 97164- PT Re-evaluation, 97750- Physical Performance Testing, 97110-Therapeutic exercises, 97530- Therapeutic activity, 97112- Neuromuscular re-education, 97535- Self Care, [id number]- Manual therapy,  [phone]- Gait training, [phone]- Aquatic Therapy, [phone]- Electrical stimulation (unattended), [phone]- Electrical stimulation (manual), [id number]- Ionotophoresis 4mg /ml Dexamethasone , 79439 (1-2 muscles), 20561 (3+ muscles)- Dry Needling, Patient/Family education, Balance training, Stair training, Taping, Joint mobilization, DME instructions, and Cryotherapy.  PLAN FOR NEXT SESSION: Land and aquatics: left extremity and core strengthening, balance/proprioception retraining, gait training; HEP, pain management   Frankie Shelaine Frie, PT Ronal Dupont) Eilah Common MPT 05/10/24 12:39 PM [facility] [address] [place], [place], [address] Phone: [phone]   Fax:  [phone]  Referring diagnosis? 04/06/24 Treatment diagnosis? (if different than referring diagnosis) Darnella, Dorn SAUNDERS, MD  What was this (referring dx) caused by? [x]  Surgery []  Fall [x]  Ongoing issue [x]  Arthritis []  Other: ____________  Laterality: []  Rt []  Lt [x]  Both  Check all possible CPT codes:  *CHOOSE 10 OR LESS*    See Planned Interventions listed in the Plan section of the Evaluation.   "

## 2024-05-15 ENCOUNTER — Encounter (HOSPITAL_BASED_OUTPATIENT_CLINIC_OR_DEPARTMENT_OTHER): Payer: Self-pay | Admitting: Physical Therapy

## 2024-05-15 ENCOUNTER — Ambulatory Visit (HOSPITAL_BASED_OUTPATIENT_CLINIC_OR_DEPARTMENT_OTHER): Admitting: Physical Therapy

## 2024-05-15 DIAGNOSIS — R2689 Other abnormalities of gait and mobility: Secondary | ICD-10-CM

## 2024-05-15 DIAGNOSIS — M6281 Muscle weakness (generalized): Secondary | ICD-10-CM

## 2024-05-15 DIAGNOSIS — M542 Cervicalgia: Secondary | ICD-10-CM

## 2024-05-15 DIAGNOSIS — M5459 Other low back pain: Secondary | ICD-10-CM

## 2024-05-15 DIAGNOSIS — G959 Disease of spinal cord, unspecified: Secondary | ICD-10-CM | POA: Diagnosis not present

## 2024-05-15 NOTE — Therapy (Signed)
 " OUTPATIENT PHYSICAL THERAPY TREATMENT   Patient Name: Cameron [name] MRN: [id number] DOB:11/23/1955, 69 y.o., male Today's Date: 05/15/2024  END OF SESSION:  PT End of Session - 05/15/24 0720     Visit Number 2    Date for Recertification  07/19/24    Authorization Type [employer]    Authorization Time Period 20 visits approved  From 01.23.2026 - 03.28.2026    Authorization - Visit Number 2    Authorization - Number of Visits 20    PT Start Time 0717    PT Stop Time 0802    PT Time Calculation (min) 45 min    Activity Tolerance Patient tolerated treatment well    Behavior During Therapy [facility] for tasks assessed/performed          Past Medical History:  Diagnosis Date   Abnormal MRI 2007   nonspecific white matter abnormalities   Allergy    seasonal   Anxiety    no meds   Arthritis    spine   Cataract    GERD (gastroesophageal reflux disease)    Helicobacter pylori gastritis 07/20/2023        History of kidney stones    x 2 episodes- last 2020   Hx of adenomatous colonic polyps 09/02/2021   2 diminutive adenomas recall 2030   Idiopathic scoliosis    Migraines    OCULAR connected to MS   Multiple sclerosis    Neuromuscular disorder (HCC)    MS - mild left side spasicity   PONV (postoperative nausea and vomiting)    Vertigo    1 episode.  approx 12/2021.  Lasted 2-3 days.   Past Surgical History:  Procedure Laterality Date   ANTERIOR CERVICAL DECOMP/DISCECTOMY FUSION N/A 01/11/2024   Procedure: ANTERIOR CERVICAL DECOMPRESSION/DISCECTOMY FUSION CERVICAL FOUR-SEVEN;  Surgeon: Darnella Dorn SAUNDERS, MD;  Location: [facility] OR;  Service: Neurosurgery;  Laterality: N/A;   CATARACT EXTRACTION W/PHACO Right 06/28/2023   Procedure: CATARACT EXTRACTION PHACO AND INTRAOCULAR LENS PLACEMENT (IOC) RIGHT  6.17  00:42.8;  Surgeon: Mittie Gaskin, MD;  Location: [facility];  Service: Ophthalmology;  Laterality: Right;   COLONOSCOPY  06/27/2007   Normal    CYSTOSCOPY/URETEROSCOPY/HOLMIUM LASER/STENT PLACEMENT Right 07/27/2018   Procedure: CYSTOSCOPY/URETEROSCOPY/HOLMIUM LASER/STENT PLACEMENT;  Surgeon: Twylla Glendia BROCKS, MD;  Location: [facility] ORS;  Service: Urology;  Laterality: Right;   LUMBAR DISC SURGERY     TONSILLECTOMY     URETEROLITHOTOMY  11/16/2001   US  ECHOCARDIOGRAPHY  11/16/2004   normal, Carotids normal   Patient Active Problem List   Diagnosis Date Noted   Elective surgery 01/11/2024   Tendonitis of shoulder 08/10/2023   Anterior shin splints 08/10/2023   Helicobacter pylori gastritis 07/20/2023   Early satiety 06/06/2023   Prostate hypertrophy 07/12/2022   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   Hx of adenomatous colonic polyps 09/02/2021   Right optic neuritis 02/04/2021   Lumbar radiculopathy 06/25/2020   Lumbar disc herniation 05/01/2020   High risk medication use 11/04/2019   Vitamin D  deficiency 11/04/2019   Left foot drop 11/04/2019   Abnormal MRI of head 09/24/2019   Ureteral calculus 07/25/2018   Hydronephrosis with urinary obstruction due to ureteral calculus 07/25/2018   Sensorineural hearing loss (SNHL), bilateral 03/06/2017   Low back pain 05/29/2014   Routine general medical examination at a health care facility 02/23/2012   GERD (gastroesophageal reflux disease)    Hyperlipemia 10/03/2007   Seasonal allergic rhinitis due to pollen 07/19/2006   RENAL  CALCULUS 07/19/2006   Multiple sclerosis 10/16/2005    PCP: Charlie Crete  REFERRING PROVIDER: Darnella Dorn SAUNDERS, MD   REFERRING DIAG: G95.9 (ICD-10-CM) - Disease of spinal cord, unspecified   Rationale for Evaluation and Treatment: Rehabilitation  THERAPY DIAG:  Muscle weakness (generalized)  Other abnormalities of gait and mobility  Other low back pain  Cervicalgia  ONSET DATE: 2020  SUBJECTIVE:                                                                                                                                                                                            SUBJECTIVE STATEMENT: Patient states low back has been bothering him. Neck is tight on L UT area and is uncomfortable. Low Back symptoms L kidney area and low back area.  EVAL: I have MS had been working on my left side weakness last May at [facility].  Had a triple cervical fusion in sept 2025.  This is my post surgical PT. Like the water it helps my MS (secondary progressive). Hx of LBP back to 2021. Today having some neck and shoulder issues in right neck and shoulder. I have some spastity in my lle in quad and some times have left foot drop usually comes on when I am fatigued but unpredictable. Haven't fallen in a while  PERTINENT HISTORY:  L2-L3 ablation, Lumbar spine discectomy, MS  Spondylosis without myelopathy or radiculopathy, lumbar region  MS x 4 years  PAIN:  Are you having pain? Yes: NPRS scale: current 3/10;  Pain location: LB; left shoulder and c-spine; Pain description: ache Aggravating factors: standing still, manual activity vacuuming; working in yard Relieving factors: lidocaine , meds, hot packs; positioning  PRECAUTIONS: None   RED FLAGS: None      WEIGHT BEARING RESTRICTIONS: No   FALLS:  Has patient fallen in last 6 months? No   LIVING ENVIRONMENT: Lives with: lives with their family, husband Lives in: House/apartment Stairs: No Has following equipment at home: None   OCCUPATION: Programmer, Multimedia   PLOF: Independent  PATIENT GOALS: stamina, flexibility and muscle strength; find my way through the gym without doing something wrong  NEXT MD VISIT: next month  OBJECTIVE:  Note: Objective measures were completed at Evaluation unless otherwise noted.   DIAGNOSTIC FINDINGS:  MRI c-spine 9/25 Status post anterior cervical discectomy and fusion  IMPRESSION: Postsurgical changes of C4-7 anterior cervical discectomy and fusion. No hardware complication.  MRI lumbar spine 2022 MPRESSION: This  MRI of the lumbar spine without contrast shows the following: 1.   At L2-L3, there is a large left paramedian herniated disc filling the left lateral recess and causing  left L3 nerve root compression.  Additionally there is edema within the endplates surrounding the disc.  These changes have occurred since the 2014 MRI. 2.   At L1-L2, there is spondylolisthesis and other degenerative changes that has progressed since 2014.  There is moderate left lateral recess stenosis but no definite nerve root compression. 3.   At L3-L4, there is spondylolisthesis and other degenerative change that has progressed compared to the 2014 MRI.  There is borderline spinal stenosis but no nerve root compression 4.   At L4-L5, there is mild spinal stenosis and other degenerative change that has progressed since 2014.  There is no nerve root compression. 5.   At L5-S1, there were all mild stable degenerative changes.  No nerve root compression or spinal stenosis.  PATIENT SURVEYS:  ODI:16/50  COGNITION: Overall cognitive status: Within functional limits for tasks assessed     SENSATION: WFL  MUSCLE LENGTH: Hamstrings: tight L>R   POSTURE:  Pt reports being dx with scoliosis which appears to be a Dextroscoliosis (unable to find in chart)  PALPATION: Slight TTP throughout left sided cervical pain and shoulder   UE ROM wfl UE strength general: right wfl;  Left Pt reports left sided weakness incoordination and reduced fine motor coordination due to MS  LUMBAR ROM:   wfl  LOWER EXTREMITY ROM:     wfl LOWER EXTREMITY MMT:    MMT Right eval Left eval  Hip flexion 32.3 9.4  Hip extension    Hip abduction 29.7 22.4  Hip adduction    Hip internal rotation    Hip external rotation    Knee flexion    Knee extension 47.5 35.4  Ankle dorsiflexion    Ankle plantarflexion    Ankle inversion    Ankle eversion     (Blank rows = not tested)  LUMBAR SPECIAL TESTS:  Slump test: Negative  FUNCTIONAL  TESTS:  5 times sit to stand: 14.32 Timed up and go (TUG): 11.69 4 stage balance; Passed 1&2;  tandem: needs assistance to gain position able to hold 7s; SLS unable  Item Test date: 05/10/24 Date:  Date:   Sitting to standing 3. able to stand independently using hands Insert SmartPhrase OPRCBERGREEVAL Insert SmartPhrase OPRCBERGREEVAL  2. Standing unsupported 4. able to stand safely for 2 minutes    3. Sitting with back unsupported, feet supported 4. able to sit safely and securely for 2 minutes    4. Standing to sitting 3. controls descent by using hands    5. Pivot transfer  3. able to transfer safely with definite need of hands    6. Standing unsupported with eyes closed 3. able to stand 10 seconds with supervision    7. Standing unsupported with feet together 4. able to place feet together independently and stand 1 minute safely    8. Reaching forward with outstretched arms while standing 3. can reach forward 12 cm (5 inches)    9. Pick up object from the floor from standing 4. able to pick up slipper safely and easily    10. Turning to look behind over left and right shoulders while standing 3. looks behind one side only, other side shows less weight shift    11. Turn 360 degrees 3. able to turn 360 degrees safely, one side only, in 4 seconds or less    12. Place alternate foot on step or stool while standing unsupported 1. able to complete > 2 steps needs minimal assist    13.  Standing unsupported one foot in front 0. loses balance while stepping or standing    14. Standing on one leg 0. unable to try of needs assist to prevent fall      Total Score 38/56 Total Score:    Total Score:       GAIT: Distance walked: 500 ft Assistive device utilized: None Level of assistance: Complete Independence Comments: extended stance time rle, reduced left hip and knee flex during swing.  Pt reports some spasticity throughout left quad when fatigued  TREATMENT  05/15/24 Manual: STM L UT Seated UT  stretch 3 x 20 second holds Supine cervical retraction 2 x 10 Supine LTR 1 x 10 with 5 second holds Bridge 2 x 10   Eval Self care:Posture and body mechanic instruction; modifying and dosing exercise (walking/HEP); importance of resting and being mindful of fatigue level vs activity level needed for remainder of day.                                                                                                                              PATIENT EDUCATION:  Education details: Discussed eval findings, rehab rationale, aquatic program progression/POC and pools in area. Patient is in agreement  Person educated: Patient Education method: Explanation Education comprehension: verbalized understanding  HOME EXERCISE PROGRAM: Prior episode 5/25 land based Access Code: [email or web address]   ASSESSMENT:  CLINICAL IMPRESSION: Patient with tender and hyperactive L UT which decreases with manual. Cervical mobility and postural strengthening tolerated well. Began lumbar mobility exercises which are tolerated well. Cueing provided for mechanics and modification PRN. Will need to modify HEP due to redundancy of exercises. Patient will continue to benefit from physical therapy in order to improve function and reduce impairment.     Patient presents with pain limited deficits in  (area)        strength, ROM, endurance, activity tolerance,  and objective measures which is affecting overall participation. Patient will benefit from skilled physical therapy in order to improve function and reduce impairment.     OBJECTIVE IMPAIRMENTS: Abnormal gait, decreased activity tolerance, decreased balance, decreased coordination, decreased endurance, decreased mobility, difficulty walking, decreased strength, impaired tone, postural dysfunction, and pain.   ACTIVITY LIMITATIONS: carrying, lifting, bending, sitting, standing, squatting, stairs, transfers, and locomotion level  PARTICIPATION LIMITATIONS: meal prep,  cleaning, laundry, shopping, community activity, occupation, and yard work   [facility] POTENTIAL: Good  CLINICAL DECISION MAKING: Evolving/moderate complexity  EVALUATION COMPLEXITY: Moderate   GOALS: Goals reviewed with patient? No  SHORT TERM GOALS: Target date: 06/02/24   Pt will tolerate full aquatic sessions consistently without increase in pain and with improving function to demonstrate good toleration and effectiveness of intervention.  Baseline: Goal status: INITIAL  2.  Pt will be compliant and indep with initial HEP reporting good tolerance Baseline:  Goal status: INITIAL    LONG TERM GOALS: Target date: 07/19/24  Pt to improve on ODI to 19% to demonstrate statistically significant Improvement  in function. (MCID 13-15%) Baseline: 16/50=32% Goal status: INITIAL  2.  Pt will improve on Berg balance test to >45/= /56 to demonstrate a decrease in fall risk. (MDC 5) Baseline: 38/56 Goal status: INITIAL  3.  Pt will be indep and confident in completion of final HEP's to manage chronic conditions Baseline:  Goal status: INITIAL  4.  Pt will improve strength in Left hip to within 10lbs of contralateral side to demonstrate improved overall physical function and reduce fall risk Baseline:  Goal status: INITIAL  5.  Pt will report decrease in pain by at least 50% for improved toleration to activity/quality of life and to demonstrate improved management of pain. Baseline:  Goal status: INITIAL  6.  Pt will improve on 5 X STS test to <or=12s to demonstrate improving functional lower extremity strength, transitional movements, and balance. (Age 27-79 avg 12.6)  Baseline: 14.32 Goal status: INITIAL  PLAN:  PT FREQUENCY: 1-2x/week  PT DURATION: 10 weeks extended out due to scheduling conflicts  PLANNED INTERVENTIONS: 97164- PT Re-evaluation, 97750- Physical Performance Testing, 97110-Therapeutic exercises, 97530- Therapeutic activity, 97112- Neuromuscular re-education,  97535- Self Care, 02859- Manual therapy, [phone]- Gait training, [phone]- Aquatic Therapy, [phone]- Electrical stimulation (unattended), [phone]- Electrical stimulation (manual), [id number]- Ionotophoresis 4mg /ml Dexamethasone , 79439 (1-2 muscles), 20561 (3+ muscles)- Dry Needling, Patient/Family education, Balance training, Stair training, Taping, Joint mobilization, DME instructions, and Cryotherapy.  PLAN FOR NEXT SESSION: Land and aquatics: left extremity and core strengthening, balance/proprioception retraining, gait training; HEP, pain management   Prentice GORMAN Stains, PT 05/15/24 8:06 AM   Referring diagnosis? 04/06/24 Treatment diagnosis? (if different than referring diagnosis) Darnella Dorn SAUNDERS, MD  What was this (referring dx) caused by? [x]  Surgery []  Fall [x]  Ongoing issue [x]  Arthritis []  Other: ____________  Laterality: []  Rt []  Lt [x]  Both  Check all possible CPT codes:  *CHOOSE 10 OR LESS*    See Planned Interventions listed in the Plan section of the Evaluation.   "

## 2024-05-16 ENCOUNTER — Telehealth: Payer: Self-pay | Admitting: Nurse Practitioner

## 2024-05-16 DIAGNOSIS — K219 Gastro-esophageal reflux disease without esophagitis: Secondary | ICD-10-CM

## 2024-05-16 NOTE — Telephone Encounter (Signed)
-----   Message from [facility] sent at 04/25/2024  4:09 PM EST ----- Regarding: GERD Can we see how the protonix  is doing with the GERD

## 2024-05-16 NOTE — Telephone Encounter (Signed)
 Can we see how the protonix  is doing with the GERD

## 2024-05-17 ENCOUNTER — Ambulatory Visit (HOSPITAL_BASED_OUTPATIENT_CLINIC_OR_DEPARTMENT_OTHER): Admitting: Physical Therapy

## 2024-05-17 ENCOUNTER — Encounter (HOSPITAL_BASED_OUTPATIENT_CLINIC_OR_DEPARTMENT_OTHER): Payer: Self-pay | Admitting: Physical Therapy

## 2024-05-17 DIAGNOSIS — G959 Disease of spinal cord, unspecified: Secondary | ICD-10-CM | POA: Diagnosis not present

## 2024-05-17 DIAGNOSIS — M6281 Muscle weakness (generalized): Secondary | ICD-10-CM

## 2024-05-17 DIAGNOSIS — R2689 Other abnormalities of gait and mobility: Secondary | ICD-10-CM

## 2024-05-17 DIAGNOSIS — M5459 Other low back pain: Secondary | ICD-10-CM

## 2024-05-17 DIAGNOSIS — M542 Cervicalgia: Secondary | ICD-10-CM

## 2024-05-17 NOTE — Telephone Encounter (Unsigned)
 Copied from CRM #[id number]. Topic: Clinical - Medication Question >> May 17, 2024 10:40 AM Montie POUR wrote: Reason for CRM:  He received a message to call back and let NP Cable know that is pantoprazole  (PROTONIX ) 40 MG tablet is not working as good as he thought it would. He still is having issues. Please call him at [phone] to let him know if he needs to do anything else. Thanks

## 2024-05-17 NOTE — Telephone Encounter (Signed)
 Left detailed voicemail for patient to call the office back.

## 2024-05-20 MED ORDER — PANTOPRAZOLE SODIUM 40 MG PO TBEC: 40.0000 mg | DELAYED_RELEASE_TABLET | Freq: Two times a day (BID) | ORAL | 0 refills | Status: AC

## 2024-05-20 NOTE — Addendum Note (Signed)
 Addended by: WENDEE LYNWOOD HERO on: 05/20/2024 01:03 PM   Modules accepted: Orders

## 2024-05-20 NOTE — Telephone Encounter (Signed)
 Lets try him on protonix  40mg  BID for the next 30 days. New script sent in

## 2024-05-21 ENCOUNTER — Other Ambulatory Visit: Payer: Self-pay | Admitting: Nurse Practitioner

## 2024-05-21 DIAGNOSIS — K219 Gastro-esophageal reflux disease without esophagitis: Secondary | ICD-10-CM

## 2024-05-21 NOTE — Telephone Encounter (Signed)
 Left detailed voicemail for patient to call the office back if any concerns. Left mychart message.

## 2024-05-22 ENCOUNTER — Encounter (HOSPITAL_BASED_OUTPATIENT_CLINIC_OR_DEPARTMENT_OTHER): Payer: Self-pay | Admitting: Physical Therapy

## 2024-05-22 ENCOUNTER — Ambulatory Visit (HOSPITAL_BASED_OUTPATIENT_CLINIC_OR_DEPARTMENT_OTHER): Payer: Self-pay | Admitting: Physical Therapy

## 2024-05-22 DIAGNOSIS — R2689 Other abnormalities of gait and mobility: Secondary | ICD-10-CM

## 2024-05-22 DIAGNOSIS — M6281 Muscle weakness (generalized): Secondary | ICD-10-CM

## 2024-05-22 DIAGNOSIS — M5459 Other low back pain: Secondary | ICD-10-CM

## 2024-05-22 DIAGNOSIS — M542 Cervicalgia: Secondary | ICD-10-CM

## 2024-05-22 NOTE — Therapy (Signed)
 " OUTPATIENT PHYSICAL THERAPY TREATMENT   Patient Name: Cameron [name] MRN: [id number] DOB:08-16-1955, 69 y.o., male Today's Date: 05/22/2024  END OF SESSION:  PT End of Session - 05/22/24 1023     Visit Number 4    Number of Visits 16    Date for Recertification  07/19/24    Authorization Type [employer]    Authorization Time Period 20 visits approved  From 01.23.2026 - 03.28.2026    Authorization - Number of Visits 20    PT Start Time 1020    PT Stop Time 1100    PT Time Calculation (min) 40 min    Activity Tolerance Patient tolerated treatment well    Behavior During Therapy [facility] for tasks assessed/performed          Past Medical History:  Diagnosis Date   Abnormal MRI 2007   nonspecific white matter abnormalities   Allergy    seasonal   Anxiety    no meds   Arthritis    spine   Cataract    GERD (gastroesophageal reflux disease)    Helicobacter pylori gastritis 07/20/2023        History of kidney stones    x 2 episodes- last 2020   Hx of adenomatous colonic polyps 09/02/2021   2 diminutive adenomas recall 2030   Idiopathic scoliosis    Migraines    OCULAR connected to MS   Multiple sclerosis    Neuromuscular disorder (HCC)    MS - mild left side spasicity   PONV (postoperative nausea and vomiting)    Vertigo    1 episode.  approx 12/2021.  Lasted 2-3 days.   Past Surgical History:  Procedure Laterality Date   ANTERIOR CERVICAL DECOMP/DISCECTOMY FUSION N/A 01/11/2024   Procedure: ANTERIOR CERVICAL DECOMPRESSION/DISCECTOMY FUSION CERVICAL FOUR-SEVEN;  Surgeon: Darnella Dorn SAUNDERS, MD;  Location: [facility] OR;  Service: Neurosurgery;  Laterality: N/A;   CATARACT EXTRACTION W/PHACO Right 06/28/2023   Procedure: CATARACT EXTRACTION PHACO AND INTRAOCULAR LENS PLACEMENT (IOC) RIGHT  6.17  00:42.8;  Surgeon: Mittie Gaskin, MD;  Location: [facility];  Service: Ophthalmology;  Laterality: Right;   COLONOSCOPY  06/27/2007   Normal    CYSTOSCOPY/URETEROSCOPY/HOLMIUM LASER/STENT PLACEMENT Right 07/27/2018   Procedure: CYSTOSCOPY/URETEROSCOPY/HOLMIUM LASER/STENT PLACEMENT;  Surgeon: Twylla Glendia BROCKS, MD;  Location: [facility] ORS;  Service: Urology;  Laterality: Right;   LUMBAR DISC SURGERY     TONSILLECTOMY     URETEROLITHOTOMY  11/16/2001   US  ECHOCARDIOGRAPHY  11/16/2004   normal, Carotids normal   Patient Active Problem List   Diagnosis Date Noted   Elective surgery 01/11/2024   Tendonitis of shoulder 08/10/2023   Anterior shin splints 08/10/2023   Helicobacter pylori gastritis 07/20/2023   Early satiety 06/06/2023   Prostate hypertrophy 07/12/2022   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   Hx of adenomatous colonic polyps 09/02/2021   Right optic neuritis 02/04/2021   Lumbar radiculopathy 06/25/2020   Lumbar disc herniation 05/01/2020   High risk medication use 11/04/2019   Vitamin D  deficiency 11/04/2019   Left foot drop 11/04/2019   Abnormal MRI of head 09/24/2019   Ureteral calculus 07/25/2018   Hydronephrosis with urinary obstruction due to ureteral calculus 07/25/2018   Sensorineural hearing loss (SNHL), bilateral 03/06/2017   Low back pain 05/29/2014   Routine general medical examination at a health care facility 02/23/2012   GERD (gastroesophageal reflux disease)    Hyperlipemia 10/03/2007   Seasonal allergic rhinitis due to pollen 07/19/2006   RENAL CALCULUS  07/19/2006   Multiple sclerosis 10/16/2005    PCP: Charlie Crete  REFERRING PROVIDER: Darnella Dorn SAUNDERS, MD   REFERRING DIAG: G95.9 (ICD-10-CM) - Disease of spinal cord, unspecified   Rationale for Evaluation and Treatment: Rehabilitation  THERAPY DIAG:  Muscle weakness (generalized)  Other abnormalities of gait and mobility  Other low back pain  Cervicalgia  ONSET DATE: 2020  SUBJECTIVE:                                                                                                                                                                                            SUBJECTIVE STATEMENT: Patient states everything has been really helpful.   Pool Access:  has silver sneakers and can go to [employer]: I have MS had been working on my left side weakness last May at [place].  Had a triple cervical fusion in sept 2025.  This is my post surgical PT. Like the water it helps my MS (secondary progressive). Hx of LBP back to 2021. Today having some neck and shoulder issues in right neck and shoulder. I have some spastity in my lle in quad and some times have left foot drop usually comes on when I am fatigued but unpredictable. Haven't fallen in a while  PERTINENT HISTORY:  L2-L3 ablation, Lumbar spine discectomy, MS  Spondylosis without myelopathy or radiculopathy, lumbar region  MS x 4 years  PAIN:  Are you having pain? Yes: NPRS scale: current 5/10 Lt upper trap, 4-5/5 lower back Pain location: see above  Pain description: ache Aggravating factors: standing still, manual activity vacuuming; working in yard Relieving factors: lidocaine , meds, hot packs; positioning  PRECAUTIONS: None   RED FLAGS: None      WEIGHT BEARING RESTRICTIONS: No   FALLS:  Has patient fallen in last 6 months? No   LIVING ENVIRONMENT: Lives with: lives with their family, husband Lives in: House/apartment Stairs: No Has following equipment at home: None   OCCUPATION: Programmer, Multimedia   PLOF: Independent  PATIENT GOALS: stamina, flexibility and muscle strength; find my way through the gym without doing something wrong  NEXT MD VISIT: next month  OBJECTIVE:  Note: Objective measures were completed at Evaluation unless otherwise noted.   DIAGNOSTIC FINDINGS:  MRI c-spine 9/25 Status post anterior cervical discectomy and fusion  IMPRESSION: Postsurgical changes of C4-7 anterior cervical discectomy and fusion. No hardware complication.  MRI lumbar spine 2022 MPRESSION: This MRI of the lumbar  spine without contrast shows the following: 1.   At L2-L3, there is a large left paramedian herniated disc filling the left lateral recess and causing left L3 nerve root compression.  Additionally there is edema within the endplates surrounding the disc.  These changes have occurred since the 2014 MRI. 2.   At L1-L2, there is spondylolisthesis and other degenerative changes that has progressed since 2014.  There is moderate left lateral recess stenosis but no definite nerve root compression. 3.   At L3-L4, there is spondylolisthesis and other degenerative change that has progressed compared to the 2014 MRI.  There is borderline spinal stenosis but no nerve root compression 4.   At L4-L5, there is mild spinal stenosis and other degenerative change that has progressed since 2014.  There is no nerve root compression. 5.   At L5-S1, there were all mild stable degenerative changes.  No nerve root compression or spinal stenosis.  PATIENT SURVEYS:  ODI:16/50  COGNITION: Overall cognitive status: Within functional limits for tasks assessed     SENSATION: WFL  MUSCLE LENGTH: Hamstrings: tight L>R   POSTURE:  Pt reports being dx with scoliosis which appears to be a Dextroscoliosis (unable to find in chart)  PALPATION: Slight TTP throughout left sided cervical pain and shoulder   UE ROM wfl UE strength general: right wfl;  Left Pt reports left sided weakness incoordination and reduced fine motor coordination due to MS  LUMBAR ROM:   wfl  LOWER EXTREMITY ROM:     wfl LOWER EXTREMITY MMT:    MMT Right eval Left eval  Hip flexion 32.3 9.4  Hip extension    Hip abduction 29.7 22.4  Hip adduction    Hip internal rotation    Hip external rotation    Knee flexion    Knee extension 47.5 35.4  Ankle dorsiflexion    Ankle plantarflexion    Ankle inversion    Ankle eversion     (Blank rows = not tested)  LUMBAR SPECIAL TESTS:  Slump test: Negative  FUNCTIONAL TESTS:  5 times sit  to stand: 14.32 Timed up and go (TUG): 11.69 4 stage balance; Passed 1&2;  tandem: needs assistance to gain position able to hold 7s; SLS unable  Item Test date: 05/10/24 Date:  Date:   Sitting to standing 3. able to stand independently using hands Insert SmartPhrase OPRCBERGREEVAL Insert SmartPhrase OPRCBERGREEVAL  2. Standing unsupported 4. able to stand safely for 2 minutes    3. Sitting with back unsupported, feet supported 4. able to sit safely and securely for 2 minutes    4. Standing to sitting 3. controls descent by using hands    5. Pivot transfer  3. able to transfer safely with definite need of hands    6. Standing unsupported with eyes closed 3. able to stand 10 seconds with supervision    7. Standing unsupported with feet together 4. able to place feet together independently and stand 1 minute safely    8. Reaching forward with outstretched arms while standing 3. can reach forward 12 cm (5 inches)    9. Pick up object from the floor from standing 4. able to pick up slipper safely and easily    10. Turning to look behind over left and right shoulders while standing 3. looks behind one side only, other side shows less weight shift    11. Turn 360 degrees 3. able to turn 360 degrees safely, one side only, in 4 seconds or less    12. Place alternate foot on step or stool while standing unsupported 1. able to complete > 2 steps needs minimal assist    13. Standing unsupported one foot in front  0. loses balance while stepping or standing    14. Standing on one leg 0. unable to try of needs assist to prevent fall      Total Score 38/56 Total Score:    Total Score:       GAIT: Distance walked: 500 ft Assistive device utilized: None Level of assistance: Complete Independence Comments: extended stance time rle, reduced left hip and knee flex during swing.  Pt reports some spasticity throughout left quad when fatigued  TREATMENT  05/22/24 Manual: STM L UT Supine cervical retraction 2 x  10 Standing row BTB 2 x 10 Standing shoulder extension BTB 2 x 10  Review of HEP   [facility] Adult PT Treatment:                                             Date:05/17/24   Self care:  Pt instructed in self massage with cane and with ball to upper trap, periscapular musculature and lower back. Pt returned demo with cues.   Pt seen for aquatic therapy today.  Treatment took place in water 3.5-4.75 ft in depth at the [facility] pool. Temp of water was 91.  Pt entered/exited the pool via stairs independently with bil rail.  - unsupported walking forward/ backward in 4+ ft of water, cues for vertical trunk -  unsupported side stepping -> with horz abdct/ add with rainbow hand floats - suitcase carry marching backward/forward with bil/single rainbow hand floats - side stepping with arm add/abdct with rianbow hand floats - TrA set with 1/2 pull down to thighs in staggered stance x 5->   - UE on wall:  hip flexion /extension x 10 each side; relaxed squat   Pt requires the buoyancy and hydrostatic pressure of water for support, and to offload joints by unweighting joint load by at least 50 % in navel deep water and by at least 75-80% in chest to neck deep water.  Viscosity of the water is needed for resistance of strengthening. Water current perturbations provides challenge to standing balance requiring increased core activation.     05/15/24 Manual: STM L UT Seated UT stretch 3 x 20 second holds Supine cervical retraction 2 x 10 Supine LTR 1 x 10 with 5 second holds Bridge 2 x 10   Eval Self care:Posture and body mechanic instruction; modifying and dosing exercise (walking/HEP); importance of resting and being mindful of fatigue level vs activity level needed for remainder of day.                                                                                                                              PATIENT EDUCATION:  Education details: reacquainting to  Person educated:  Patient Education method: Explanation Education comprehension: verbalized understanding  HOME EXERCISE PROGRAM: Prior episode 5/25 land based Access Code: [facility]  ASSESSMENT:  CLINICAL IMPRESSION: Continued with manual to hyperactive and tender UT. Decrease in tissue tension with manual. Continued with postural strengthening with multimodial cueing for head positioning and posture. Patient will continue to benefit from physical therapy in order to improve function and reduce impairment.       OBJECTIVE IMPAIRMENTS: Abnormal gait, decreased activity tolerance, decreased balance, decreased coordination, decreased endurance, decreased mobility, difficulty walking, decreased strength, impaired tone, postural dysfunction, and pain.   ACTIVITY LIMITATIONS: carrying, lifting, bending, sitting, standing, squatting, stairs, transfers, and locomotion level  PARTICIPATION LIMITATIONS: meal prep, cleaning, laundry, shopping, community activity, occupation, and yard work   [facility] POTENTIAL: Good  CLINICAL DECISION MAKING: Evolving/moderate complexity  EVALUATION COMPLEXITY: Moderate   GOALS: Goals reviewed with patient? No  SHORT TERM GOALS: Target date: 06/02/24   Pt will tolerate full aquatic sessions consistently without increase in pain and with improving function to demonstrate good toleration and effectiveness of intervention.  Baseline: Goal status: INITIAL  2.  Pt will be compliant and indep with initial HEP reporting good tolerance Baseline:  Goal status: INITIAL    LONG TERM GOALS: Target date: 07/19/24  Pt to improve on ODI to 19% to demonstrate statistically significant Improvement in function. (MCID 13-15%) Baseline: 16/50=32% Goal status: INITIAL  2.  Pt will improve on Berg balance test to >45/= /56 to demonstrate a decrease in fall risk. (MDC 5) Baseline: 38/56 Goal status: INITIAL  3.  Pt will be indep and confident in completion of final HEP's to manage  chronic conditions Baseline:  Goal status: INITIAL  4.  Pt will improve strength in Left hip to within 10lbs of contralateral side to demonstrate improved overall physical function and reduce fall risk Baseline:  Goal status: INITIAL  5.  Pt will report decrease in pain by at least 50% for improved toleration to activity/quality of life and to demonstrate improved management of pain. Baseline:  Goal status: INITIAL  6.  Pt will improve on 5 X STS test to <or=12s to demonstrate improving functional lower extremity strength, transitional movements, and balance. (Age 52-79 avg 12.6)  Baseline: 14.32 Goal status: INITIAL  PLAN:  PT FREQUENCY: 1-2x/week  PT DURATION: 10 weeks extended out due to scheduling conflicts  PLANNED INTERVENTIONS: 97164- PT Re-evaluation, 97750- Physical Performance Testing, 97110-Therapeutic exercises, 97530- Therapeutic activity, 97112- Neuromuscular re-education, 97535- Self Care, [id number]- Manual therapy, [phone]- Gait training, [phone]- Aquatic Therapy, [phone]- Electrical stimulation (unattended), [phone]- Electrical stimulation (manual), [id number]- Ionotophoresis 4mg /ml Dexamethasone , 79439 (1-2 muscles), 20561 (3+ muscles)- Dry Needling, Patient/Family education, Balance training, Stair training, Taping, Joint mobilization, DME instructions, and Cryotherapy.  PLAN FOR NEXT SESSION: Land and aquatics: left extremity and core strengthening, balance/proprioception retraining, gait training; HEP, pain management   Prentice GORMAN Stains, PT, DPT 05/22/2024, 10:23 AM     Referring diagnosis? 04/06/24 Treatment diagnosis? (if different than referring diagnosis) Darnella, Dorn SAUNDERS, MD  What was this (referring dx) caused by? [x]  Surgery []  Fall [x]  Ongoing issue [x]  Arthritis []  Other: ____________  Laterality: []  Rt []  Lt [x]  Both  Check all possible CPT codes:  *CHOOSE 10 OR LESS*    See Planned Interventions listed in the Plan section of the Evaluation.   "

## 2024-05-24 ENCOUNTER — Ambulatory Visit (HOSPITAL_BASED_OUTPATIENT_CLINIC_OR_DEPARTMENT_OTHER): Admitting: Physical Therapy

## 2024-05-24 ENCOUNTER — Encounter (HOSPITAL_BASED_OUTPATIENT_CLINIC_OR_DEPARTMENT_OTHER): Payer: Self-pay | Admitting: Physical Therapy

## 2024-05-24 DIAGNOSIS — M6281 Muscle weakness (generalized): Secondary | ICD-10-CM

## 2024-05-24 DIAGNOSIS — M5459 Other low back pain: Secondary | ICD-10-CM

## 2024-05-24 DIAGNOSIS — R2689 Other abnormalities of gait and mobility: Secondary | ICD-10-CM

## 2024-05-24 NOTE — Therapy (Signed)
 " OUTPATIENT PHYSICAL THERAPY TREATMENT   Patient Name: Cameron [name] MRN: [id number] DOB:Nov 19, 1955, 69 y.o., male Today's Date: 05/24/2024  END OF SESSION:  PT End of Session - 05/24/24 1017     Visit Number 5    Number of Visits 16    Date for Recertification  07/19/24    Authorization Type [employer]    Authorization Time Period 20 visits approved  From 01.23.2026 - 03.28.2026    Authorization - Visit Number 5    Authorization - Number of Visits 20    PT Start Time 0932    PT Stop Time 1015    PT Time Calculation (min) 43 min    Activity Tolerance Patient tolerated treatment well    Behavior During Therapy [facility] for tasks assessed/performed           Past Medical History:  Diagnosis Date   Abnormal MRI 2007   nonspecific white matter abnormalities   Allergy    seasonal   Anxiety    no meds   Arthritis    spine   Cataract    GERD (gastroesophageal reflux disease)    Helicobacter pylori gastritis 07/20/2023        History of kidney stones    x 2 episodes- last 2020   Hx of adenomatous colonic polyps 09/02/2021   2 diminutive adenomas recall 2030   Idiopathic scoliosis    Migraines    OCULAR connected to MS   Multiple sclerosis    Neuromuscular disorder (HCC)    MS - mild left side spasicity   PONV (postoperative nausea and vomiting)    Vertigo    1 episode.  approx 12/2021.  Lasted 2-3 days.   Past Surgical History:  Procedure Laterality Date   ANTERIOR CERVICAL DECOMP/DISCECTOMY FUSION N/A 01/11/2024   Procedure: ANTERIOR CERVICAL DECOMPRESSION/DISCECTOMY FUSION CERVICAL FOUR-SEVEN;  Surgeon: Darnella Dorn SAUNDERS, MD;  Location: [facility] OR;  Service: Neurosurgery;  Laterality: N/A;   CATARACT EXTRACTION W/PHACO Right 06/28/2023   Procedure: CATARACT EXTRACTION PHACO AND INTRAOCULAR LENS PLACEMENT (IOC) RIGHT  6.17  00:42.8;  Surgeon: Mittie Gaskin, MD;  Location[facility];  Service: Ophthalmology;  Laterality: Right;   COLONOSCOPY  06/27/2007    Normal   CYSTOSCOPY/URETEROSCOPY/HOLMIUM LASER/STENT PLACEMENT Right 07/27/2018   Procedure: CYSTOSCOPY/URETEROSCOPY/HOLMIUM LASER/STENT PLACEMENT;  Surgeon: Twylla Glendia BROCKS, MD;  Location: [facility] ORS;  Service: Urology;  Laterality: Right;   LUMBAR DISC SURGERY     TONSILLECTOMY     URETEROLITHOTOMY  11/16/2001   US  ECHOCARDIOGRAPHY  11/16/2004   normal, Carotids normal   Patient Active Problem List   Diagnosis Date Noted   Elective surgery 01/11/2024   Tendonitis of shoulder 08/10/2023   Anterior shin splints 08/10/2023   Helicobacter pylori gastritis 07/20/2023   Early satiety 06/06/2023   Prostate hypertrophy 07/12/2022   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   Hx of adenomatous colonic polyps 09/02/2021   Right optic neuritis 02/04/2021   Lumbar radiculopathy 06/25/2020   Lumbar disc herniation 05/01/2020   High risk medication use 11/04/2019   Vitamin D  deficiency 11/04/2019   Left foot drop 11/04/2019   Abnormal MRI of head 09/24/2019   Ureteral calculus 07/25/2018   Hydronephrosis with urinary obstruction due to ureteral calculus 07/25/2018   Sensorineural hearing loss (SNHL), bilateral 03/06/2017   Low back pain 05/29/2014   Routine general medical examination at a health care facility 02/23/2012   GERD (gastroesophageal reflux disease)    Hyperlipemia 10/03/2007   Seasonal allergic  rhinitis due to pollen 07/19/2006   RENAL CALCULUS 07/19/2006   Multiple sclerosis 10/16/2005    PCP: Charlie Crete  REFERRING PROVIDER: Darnella Dorn SAUNDERS, MD   REFERRING DIAG: G95.9 (ICD-10-CM) - Disease of spinal cord, unspecified   Rationale for Evaluation and Treatment: Rehabilitation  THERAPY DIAG:  Muscle weakness (generalized)  Other abnormalities of gait and mobility  Other low back pain  ONSET DATE: 2020  SUBJECTIVE:                                                                                                                                                                                            SUBJECTIVE STATEMENT: Patient reports theory that his body is trying to readjust to his added height causing some odd back fatigue.  Pain c-spine and LB 4/10  Pool Access:  has silver sneakers and can go to [employer]  EVAL: I have MS had been working on my left side weakness last May at [place].  Had a triple cervical fusion in sept 2025.  This is my post surgical PT. Like the water it helps my MS (secondary progressive). Hx of LBP back to 2021. Today having some neck and shoulder issues in right neck and shoulder. I have some spastity in my lle in quad and some times have left foot drop usually comes on when I am fatigued but unpredictable. Haven't fallen in a while  PERTINENT HISTORY:  L2-L3 ablation, Lumbar spine discectomy, MS  Spondylosis without myelopathy or radiculopathy, lumbar region  MS x 4 years  PAIN:  Are you having pain? Yes: NPRS scale: current 5/10 Lt upper trap, 4-5/5 lower back Pain location: see above  Pain description: ache Aggravating factors: standing still, manual activity vacuuming; working in yard Relieving factors: lidocaine , meds, hot packs; positioning  PRECAUTIONS: None   RED FLAGS: None      WEIGHT BEARING RESTRICTIONS: No   FALLS:  Has patient fallen in last 6 months? No   LIVING ENVIRONMENT: Lives with: lives with their family, husband Lives in: House/apartment Stairs: No Has following equipment at home: None   OCCUPATION: Programmer, Multimedia   PLOF: Independent  PATIENT GOALS: stamina, flexibility and muscle strength; find my way through the gym without doing something wrong  NEXT MD VISIT: next month  OBJECTIVE:  Note: Objective measures were completed at Evaluation unless otherwise noted.   DIAGNOSTIC FINDINGS:  MRI c-spine 9/25 Status post anterior cervical discectomy and fusion  IMPRESSION: Postsurgical changes of C4-7 anterior cervical discectomy and fusion. No  hardware complication.  MRI lumbar spine 2022 MPRESSION: This MRI of the lumbar spine without contrast shows the following: 1.  At L2-L3, there is a large left paramedian herniated disc filling the left lateral recess and causing left L3 nerve root compression.  Additionally there is edema within the endplates surrounding the disc.  These changes have occurred since the 2014 MRI. 2.   At L1-L2, there is spondylolisthesis and other degenerative changes that has progressed since 2014.  There is moderate left lateral recess stenosis but no definite nerve root compression. 3.   At L3-L4, there is spondylolisthesis and other degenerative change that has progressed compared to the 2014 MRI.  There is borderline spinal stenosis but no nerve root compression 4.   At L4-L5, there is mild spinal stenosis and other degenerative change that has progressed since 2014.  There is no nerve root compression. 5.   At L5-S1, there were all mild stable degenerative changes.  No nerve root compression or spinal stenosis.  PATIENT SURVEYS:  ODI:16/50  COGNITION: Overall cognitive status: Within functional limits for tasks assessed     SENSATION: WFL  MUSCLE LENGTH: Hamstrings: tight L>R   POSTURE:  Pt reports being dx with scoliosis which appears to be a Dextroscoliosis (unable to find in chart)  PALPATION: Slight TTP throughout left sided cervical pain and shoulder   UE ROM wfl UE strength general: right wfl;  Left Pt reports left sided weakness incoordination and reduced fine motor coordination due to MS  LUMBAR ROM:   wfl  LOWER EXTREMITY ROM:     wfl LOWER EXTREMITY MMT:    MMT Right eval Left eval  Hip flexion 32.3 9.4  Hip extension    Hip abduction 29.7 22.4  Hip adduction    Hip internal rotation    Hip external rotation    Knee flexion    Knee extension 47.5 35.4  Ankle dorsiflexion    Ankle plantarflexion    Ankle inversion    Ankle eversion     (Blank rows = not  tested)  LUMBAR SPECIAL TESTS:  Slump test: Negative  FUNCTIONAL TESTS:  5 times sit to stand: 14.32 Timed up and go (TUG): 11.69 4 stage balance; Passed 1&2;  tandem: needs assistance to gain position able to hold 7s; SLS unable  Item Test date: 05/10/24 Date:  Date:   Sitting to standing 3. able to stand independently using hands Insert SmartPhrase OPRCBERGREEVAL Insert SmartPhrase OPRCBERGREEVAL  2. Standing unsupported 4. able to stand safely for 2 minutes    3. Sitting with back unsupported, feet supported 4. able to sit safely and securely for 2 minutes    4. Standing to sitting 3. controls descent by using hands    5. Pivot transfer  3. able to transfer safely with definite need of hands    6. Standing unsupported with eyes closed 3. able to stand 10 seconds with supervision    7. Standing unsupported with feet together 4. able to place feet together independently and stand 1 minute safely    8. Reaching forward with outstretched arms while standing 3. can reach forward 12 cm (5 inches)    9. Pick up object from the floor from standing 4. able to pick up slipper safely and easily    10. Turning to look behind over left and right shoulders while standing 3. looks behind one side only, other side shows less weight shift    11. Turn 360 degrees 3. able to turn 360 degrees safely, one side only, in 4 seconds or less    12. Place alternate foot on step or stool  while standing unsupported 1. able to complete > 2 steps needs minimal assist    13. Standing unsupported one foot in front 0. loses balance while stepping or standing    14. Standing on one leg 0. unable to try of needs assist to prevent fall      Total Score 38/56 Total Score:    Total Score:       GAIT: Distance walked: 500 ft Assistive device utilized: None Level of assistance: Complete Independence Comments: extended stance time rle, reduced left hip and knee flex during swing.  Pt reports some spasticity throughout left  quad when fatigued  TREATMENT  [facility] Adult PT Treatment:                                             Date:05/24/24   Self care:  Pt instructed in self massage with cane and with ball to upper trap, periscapular musculature and lower back. Pt returned demo with cues.   Pt seen for aquatic therapy today.  Treatment took place in water 3.5-4.75 ft in depth at the [facility] pool. Temp of water was 91.  Pt entered/exited the pool via stairs independently with bil rail.  - unsupported walking forward/ backward in 4+ ft of water, cues for vertical trunk -  unsupported side stepping -> with shoulder add/abd abdct/ add with yellow hand floats - [employer].  VC and demonstration. Cues for cervical retraction - BKTC at ladder - hip ext and abd resisted with rider band 2 x 10 -decompression on noodle with cycling  Pt requires the buoyancy and hydrostatic pressure of water for support, and to offload joints by unweighting joint load by at least 50 % in navel deep water and by at least 75-80% in chest to neck deep water.  Viscosity of the water is needed for resistance of strengthening. Water current perturbations provides challenge to standing balance requiring increased core activation.   05/22/24 Manual: STM L UT Supine cervical retraction 2 x 10 Standing row BTB 2 x 10 Standing shoulder extension BTB 2 x 10  Review of HEP   [facility] Adult PT Treatment:                                             Date:05/17/24   Self care:  Pt instructed in self massage with cane and with ball to upper trap, periscapular musculature and lower back. Pt returned demo with cues.   Pt seen for aquatic therapy today.  Treatment took place in water 3.5-4.75 ft in depth at the [facility] pool. Temp of water was 91.  Pt entered/exited the pool via stairs independently with bil rail.  - unsupported walking forward/ backward in 4+ ft of water, cues for vertical trunk -  unsupported side stepping -> with  horz abdct/ add with rainbow hand floats - suitcase carry marching backward/forward with bil/single rainbow hand floats - side stepping with arm add/abdct with rianbow hand floats - TrA set with 1/2 pull down to thighs in staggered stance x 5->   - UE on wall:  hip flexion /extension x 10 each side; relaxed squat   Pt requires the buoyancy and hydrostatic pressure of water for support, and to offload joints by unweighting joint  load by at least 50 % in navel deep water and by at least 75-80% in chest to neck deep water.  Viscosity of the water is needed for resistance of strengthening. Water current perturbations provides challenge to standing balance requiring increased core activation.     05/15/24 Manual: STM L UT Seated UT stretch 3 x 20 second holds Supine cervical retraction 2 x 10 Supine LTR 1 x 10 with 5 second holds Bridge 2 x 10   Eval Self care:Posture and body mechanic instruction; modifying and dosing exercise (walking/HEP); importance of resting and being mindful of fatigue level vs activity level needed for remainder of day.                                                                                                                              PATIENT EDUCATION:  Education details: reacquainting to  Person educated: Patient Education method: Explanation Education comprehension: verbalized understanding  HOME EXERCISE PROGRAM: Prior episode 5/25 land based Access Code: [email or web address]   ASSESSMENT:  CLINICAL IMPRESSION: Cues for core stabilization/engagement throughout session with all exercises. Postural adjustments as well throughout for upright positioning and postural strength.  Focused on core and glut strengthening with good toleration.  Pain reduction throughout to 1-2/10.  He reports compliance with initial HEP daily.  Goals ongoing     OBJECTIVE IMPAIRMENTS: Abnormal gait, decreased activity tolerance, decreased balance, decreased coordination, decreased  endurance, decreased mobility, difficulty walking, decreased strength, impaired tone, postural dysfunction, and pain.   ACTIVITY LIMITATIONS: carrying, lifting, bending, sitting, standing, squatting, stairs, transfers, and locomotion level  PARTICIPATION LIMITATIONS: meal prep, cleaning, laundry, shopping, community activity, occupation, and yard work   [facility] POTENTIAL: Good  CLINICAL DECISION MAKING: Evolving/moderate complexity  EVALUATION COMPLEXITY: Moderate   GOALS: Goals reviewed with patient? No  SHORT TERM GOALS: Target date: 06/02/24   Pt will tolerate full aquatic sessions consistently without increase in pain and with improving function to demonstrate good toleration and effectiveness of intervention.  Baseline: Goal status: Met 05/24/24  2.  Pt will be compliant and indep with initial HEP reporting good tolerance Baseline:  Goal status: Met 05/25/23    LONG TERM GOALS: Target date: 07/19/24  Pt to improve on ODI to 19% to demonstrate statistically significant Improvement in function. (MCID 13-15%) Baseline: 16/50=32% Goal status: INITIAL  2.  Pt will improve on Berg balance test to >45/= /56 to demonstrate a decrease in fall risk. (MDC 5) Baseline: 38/56 Goal status: INITIAL  3.  Pt will be indep and confident in completion of final HEP's to manage chronic conditions Baseline:  Goal status: INITIAL  4.  Pt will improve strength in Left hip to within 10lbs of contralateral side to demonstrate improved overall physical function and reduce fall risk Baseline:  Goal status: INITIAL  5.  Pt will report decrease in pain by at least 50% for improved toleration to activity/quality of life and to demonstrate improved management of pain. Baseline:  Goal status: INITIAL  6.  Pt will improve on 5 X STS test to <or=12s to demonstrate improving functional lower extremity strength, transitional movements, and balance. (Age 81-79 avg 12.6)  Baseline: 14.32 Goal status:  INITIAL  PLAN:  PT FREQUENCY: 1-2x/week  PT DURATION: 10 weeks extended out due to scheduling conflicts  PLANNED INTERVENTIONS: 97164- PT Re-evaluation, 97750- Physical Performance Testing, 97110-Therapeutic exercises, 97530- Therapeutic activity, 97112- Neuromuscular re-education, 97535- Self Care, [id number]- Manual therapy, [phone]- Gait training, [phone]- Aquatic Therapy, [phone]- Electrical stimulation (unattended), [phone]- Electrical stimulation (manual), [id number]- Ionotophoresis 4mg /ml Dexamethasone , 79439 (1-2 muscles), 20561 (3+ muscles)- Dry Needling, Patient/Family education, Balance training, Stair training, Taping, Joint mobilization, DME instructions, and Cryotherapy.  PLAN FOR NEXT SESSION: Land and aquatics: left extremity and core strengthening, balance/proprioception retraining, gait training; HEP, pain management   Ronal Foots) Rodolph Hagemann MPT 05/24/24 10:26 AM [facility] [address] [place], [place], [address] Phone: [phone]   Fax:  [phone]      Referring diagnosis? 04/06/24 Treatment diagnosis? (if different than referring diagnosis) Darnella Dorn SAUNDERS, MD  What was this (referring dx) caused by? [x]  Surgery []  Fall [x]  Ongoing issue [x]  Arthritis []  Other: ____________  Laterality: []  Rt []  Lt [x]  Both  Check all possible CPT codes:  *CHOOSE 10 OR LESS*    See Planned Interventions listed in the Plan section of the Evaluation.   "

## 2024-05-28 ENCOUNTER — Encounter (HOSPITAL_BASED_OUTPATIENT_CLINIC_OR_DEPARTMENT_OTHER): Payer: Self-pay | Admitting: Physical Therapy

## 2024-05-31 ENCOUNTER — Ambulatory Visit (HOSPITAL_BASED_OUTPATIENT_CLINIC_OR_DEPARTMENT_OTHER): Admitting: Physical Therapy

## 2024-06-05 ENCOUNTER — Ambulatory Visit (HOSPITAL_BASED_OUTPATIENT_CLINIC_OR_DEPARTMENT_OTHER): Admitting: Physical Therapy

## 2024-06-08 ENCOUNTER — Encounter (HOSPITAL_BASED_OUTPATIENT_CLINIC_OR_DEPARTMENT_OTHER): Payer: Self-pay | Admitting: Physical Therapy

## 2024-06-11 ENCOUNTER — Ambulatory Visit (HOSPITAL_BASED_OUTPATIENT_CLINIC_OR_DEPARTMENT_OTHER): Admitting: Physical Therapy

## 2024-06-14 ENCOUNTER — Encounter (HOSPITAL_BASED_OUTPATIENT_CLINIC_OR_DEPARTMENT_OTHER): Admitting: Physical Therapy

## 2024-06-18 ENCOUNTER — Ambulatory Visit (HOSPITAL_BASED_OUTPATIENT_CLINIC_OR_DEPARTMENT_OTHER): Admitting: Physical Therapy

## 2024-06-20 ENCOUNTER — Encounter: Admitting: Nurse Practitioner

## 2024-06-21 ENCOUNTER — Encounter (HOSPITAL_BASED_OUTPATIENT_CLINIC_OR_DEPARTMENT_OTHER): Admitting: Physical Therapy

## 2024-06-25 ENCOUNTER — Ambulatory Visit (HOSPITAL_BASED_OUTPATIENT_CLINIC_OR_DEPARTMENT_OTHER): Admitting: Physical Therapy

## 2024-06-28 ENCOUNTER — Encounter (HOSPITAL_BASED_OUTPATIENT_CLINIC_OR_DEPARTMENT_OTHER): Admitting: Physical Therapy

## 2024-07-02 ENCOUNTER — Ambulatory Visit (HOSPITAL_BASED_OUTPATIENT_CLINIC_OR_DEPARTMENT_OTHER): Admitting: Physical Therapy

## 2024-07-05 ENCOUNTER — Encounter (HOSPITAL_BASED_OUTPATIENT_CLINIC_OR_DEPARTMENT_OTHER): Admitting: Physical Therapy

## 2024-07-09 ENCOUNTER — Ambulatory Visit (HOSPITAL_BASED_OUTPATIENT_CLINIC_OR_DEPARTMENT_OTHER): Admitting: Physical Therapy

## 2024-07-12 ENCOUNTER — Encounter (HOSPITAL_BASED_OUTPATIENT_CLINIC_OR_DEPARTMENT_OTHER): Admitting: Physical Therapy

## 2024-07-16 ENCOUNTER — Ambulatory Visit: Admitting: Neurology

## 2024-09-04 ENCOUNTER — Encounter
